# Patient Record
Sex: Male | Born: 1960
Health system: Southern US, Community
[De-identification: ages and names within clinical notes are randomized; demographics above are authoritative.]

## PROBLEM LIST (undated history)

## (undated) DIAGNOSIS — R079 Chest pain, unspecified: Secondary | ICD-10-CM

## (undated) DIAGNOSIS — K5900 Constipation, unspecified: Secondary | ICD-10-CM

## (undated) DIAGNOSIS — H9190 Unspecified hearing loss, unspecified ear: Secondary | ICD-10-CM

## (undated) DIAGNOSIS — H9319 Tinnitus, unspecified ear: Secondary | ICD-10-CM

## (undated) DIAGNOSIS — K219 Gastro-esophageal reflux disease without esophagitis: Secondary | ICD-10-CM

## (undated) DIAGNOSIS — N476 Balanoposthitis: Secondary | ICD-10-CM

## (undated) DIAGNOSIS — Z8619 Personal history of other infectious and parasitic diseases: Secondary | ICD-10-CM

## (undated) DIAGNOSIS — G4730 Sleep apnea, unspecified: Secondary | ICD-10-CM

## (undated) DIAGNOSIS — K645 Perianal venous thrombosis: Secondary | ICD-10-CM

## (undated) DIAGNOSIS — M1990 Unspecified osteoarthritis, unspecified site: Secondary | ICD-10-CM

## (undated) DIAGNOSIS — I10 Essential (primary) hypertension: Secondary | ICD-10-CM

## (undated) DIAGNOSIS — J309 Allergic rhinitis, unspecified: Secondary | ICD-10-CM

## (undated) DIAGNOSIS — G43909 Migraine, unspecified, not intractable, without status migrainosus: Secondary | ICD-10-CM

## (undated) DIAGNOSIS — H04129 Dry eye syndrome of unspecified lacrimal gland: Secondary | ICD-10-CM

## (undated) HISTORY — DX: Tinnitus, unspecified ear: H93.19

## (undated) HISTORY — PX: POLYPECTOMY: SHX149

## (undated) HISTORY — PX: FRACTURE SURGERY: SHX138

## (undated) HISTORY — DX: Perianal venous thrombosis: K64.5

## (undated) HISTORY — DX: Personal history of other infectious and parasitic diseases: Z86.19

## (undated) HISTORY — DX: Unspecified osteoarthritis, unspecified site: M19.90

## (undated) HISTORY — PX: WISDOM TOOTH EXTRACTION: SHX21

## (undated) HISTORY — DX: Constipation, unspecified: K59.00

## (undated) HISTORY — DX: Migraine, unspecified, not intractable, without status migrainosus: G43.909

## (undated) HISTORY — DX: Balanoposthitis: N47.6

## (undated) HISTORY — DX: Dry eye syndrome of unspecified lacrimal gland: H04.129

## (undated) HISTORY — DX: Gastro-esophageal reflux disease without esophagitis: K21.9

## (undated) HISTORY — DX: Unspecified hearing loss, unspecified ear: H91.90

## (undated) HISTORY — DX: Sleep apnea, unspecified: G47.30

## (undated) HISTORY — DX: Allergic rhinitis, unspecified: J30.9

## (undated) HISTORY — DX: Chest pain, unspecified: R07.9

## (undated) HISTORY — DX: Essential (primary) hypertension: I10

---

## 1991-05-16 HISTORY — PX: FOOT FRACTURE SURGERY: SHX645

## 1991-05-16 HISTORY — PX: ORIF FEMUR FRACTURE: SHX2119

## 2000-11-29 ENCOUNTER — Emergency Department (HOSPITAL_COMMUNITY): Admission: EM | Admit: 2000-11-29 | Discharge: 2000-11-29 | Payer: Self-pay | Admitting: Emergency Medicine

## 2004-09-15 ENCOUNTER — Ambulatory Visit: Payer: Self-pay | Admitting: Internal Medicine

## 2004-12-19 ENCOUNTER — Ambulatory Visit: Payer: Self-pay | Admitting: Internal Medicine

## 2005-04-28 ENCOUNTER — Ambulatory Visit: Payer: Self-pay | Admitting: Internal Medicine

## 2006-05-02 ENCOUNTER — Ambulatory Visit: Payer: Self-pay | Admitting: Internal Medicine

## 2006-05-09 ENCOUNTER — Ambulatory Visit (HOSPITAL_COMMUNITY): Admission: RE | Admit: 2006-05-09 | Discharge: 2006-05-09 | Payer: Self-pay | Admitting: Internal Medicine

## 2006-05-09 ENCOUNTER — Ambulatory Visit: Payer: Self-pay | Admitting: Cardiology

## 2006-12-18 ENCOUNTER — Ambulatory Visit: Payer: Self-pay | Admitting: Internal Medicine

## 2007-09-30 ENCOUNTER — Encounter: Payer: Self-pay | Admitting: *Deleted

## 2007-09-30 DIAGNOSIS — G43909 Migraine, unspecified, not intractable, without status migrainosus: Secondary | ICD-10-CM

## 2007-09-30 DIAGNOSIS — M6580 Other synovitis and tenosynovitis, unspecified site: Secondary | ICD-10-CM

## 2007-09-30 DIAGNOSIS — J309 Allergic rhinitis, unspecified: Secondary | ICD-10-CM

## 2007-09-30 DIAGNOSIS — R079 Chest pain, unspecified: Secondary | ICD-10-CM

## 2007-09-30 DIAGNOSIS — H04123 Dry eye syndrome of bilateral lacrimal glands: Secondary | ICD-10-CM

## 2007-09-30 DIAGNOSIS — Z8619 Personal history of other infectious and parasitic diseases: Secondary | ICD-10-CM

## 2008-01-31 ENCOUNTER — Ambulatory Visit: Payer: Self-pay | Admitting: Internal Medicine

## 2008-01-31 DIAGNOSIS — H9209 Otalgia, unspecified ear: Secondary | ICD-10-CM | POA: Insufficient documentation

## 2008-01-31 DIAGNOSIS — H9319 Tinnitus, unspecified ear: Secondary | ICD-10-CM

## 2008-01-31 DIAGNOSIS — R21 Rash and other nonspecific skin eruption: Secondary | ICD-10-CM | POA: Insufficient documentation

## 2008-01-31 DIAGNOSIS — H9313 Tinnitus, bilateral: Secondary | ICD-10-CM | POA: Insufficient documentation

## 2008-01-31 HISTORY — DX: Tinnitus, unspecified ear: H93.19

## 2008-01-31 LAB — CONVERTED CEMR LAB: Blood Glucose, Fingerstick: 104

## 2008-02-13 ENCOUNTER — Telehealth: Payer: Self-pay | Admitting: Internal Medicine

## 2008-02-14 ENCOUNTER — Ambulatory Visit: Payer: Self-pay | Admitting: Internal Medicine

## 2008-02-14 DIAGNOSIS — H6980 Other specified disorders of Eustachian tube, unspecified ear: Secondary | ICD-10-CM

## 2008-11-17 ENCOUNTER — Telehealth: Payer: Self-pay | Admitting: Internal Medicine

## 2009-04-13 ENCOUNTER — Telehealth: Payer: Self-pay | Admitting: Internal Medicine

## 2009-04-28 ENCOUNTER — Telehealth (INDEPENDENT_AMBULATORY_CARE_PROVIDER_SITE_OTHER): Payer: Self-pay | Admitting: *Deleted

## 2009-05-15 HISTORY — PX: KNEE ARTHROSCOPY: SUR90

## 2009-09-21 ENCOUNTER — Ambulatory Visit: Payer: Self-pay | Admitting: Internal Medicine

## 2009-09-21 LAB — CONVERTED CEMR LAB
ALT: 47 units/L (ref 0–53)
Albumin: 4.3 g/dL (ref 3.5–5.2)
Basophils Absolute: 0 10*3/uL (ref 0.0–0.1)
Basophils Relative: 0.5 % (ref 0.0–3.0)
Bilirubin Urine: NEGATIVE
Bilirubin, Direct: 0.2 mg/dL (ref 0.0–0.3)
CO2: 30 meq/L (ref 19–32)
Calcium: 9.5 mg/dL (ref 8.4–10.5)
Direct LDL: 130.2 mg/dL
Eosinophils Absolute: 0.1 10*3/uL (ref 0.0–0.7)
GFR calc non Af Amer: 83.12 mL/min (ref >60)
Hemoglobin: 16.9 g/dL (ref 13.0–17.0)
Ketones, ur: 15 mg/dL
MCHC: 34.8 g/dL (ref 30.0–36.0)
MCV: 92.9 fL (ref 78.0–100.0)
Nitrite: NEGATIVE
Platelets: 142 10*3/uL — ABNORMAL LOW (ref 150.0–400.0)
RBC: 5.22 M/uL (ref 4.22–5.81)
Sodium: 144 meq/L (ref 135–145)
Total Bilirubin: 1.2 mg/dL (ref 0.3–1.2)
Total CHOL/HDL Ratio: 5
Total Protein, Urine: NEGATIVE mg/dL
VLDL: 42 mg/dL — ABNORMAL HIGH (ref 0.0–40.0)
WBC: 6.3 10*3/uL (ref 4.5–10.5)
pH: 5.5 (ref 5.0–8.0)

## 2009-09-24 ENCOUNTER — Ambulatory Visit: Payer: Self-pay | Admitting: Internal Medicine

## 2009-09-24 DIAGNOSIS — K5900 Constipation, unspecified: Secondary | ICD-10-CM

## 2009-09-24 DIAGNOSIS — M2240 Chondromalacia patellae, unspecified knee: Secondary | ICD-10-CM

## 2009-09-24 DIAGNOSIS — M545 Low back pain: Secondary | ICD-10-CM

## 2009-09-24 HISTORY — DX: Constipation, unspecified: K59.00

## 2010-05-05 ENCOUNTER — Telehealth: Payer: Self-pay | Admitting: Internal Medicine

## 2010-05-06 ENCOUNTER — Ambulatory Visit: Payer: Self-pay | Admitting: Internal Medicine

## 2010-05-06 DIAGNOSIS — K645 Perianal venous thrombosis: Secondary | ICD-10-CM

## 2010-05-06 HISTORY — DX: Perianal venous thrombosis: K64.5

## 2010-06-16 NOTE — Assessment & Plan Note (Signed)
Summary: HEMORRHOIDS/MEN'S PT /NWS   Vital Signs:  Patient profile:   50 year old male Height:      62 inches Weight:      180 pounds BMI:     33.04 O2 Sat:      95 % on Room air Temp:     98.5 degrees F oral Pulse rate:   73 / minute Pulse rhythm:   regular Resp:     16 per minute BP sitting:   138 / 82  (left arm) Cuff size:   large  Vitals Entered By: Rock Nephew CMA (May 06, 2010 10:08 AM)  Nutrition Counseling: Patient's BMI is greater than 25 and therefore counseled on weight management options.  O2 Flow:  Room air  Primary Care Provider:  Norins   History of Present Illness: He returns c/o painful hemorrhoid and constipation.  Preventive Screening-Counseling & Management  Alcohol-Tobacco     Alcohol drinks/day: 0     Alcohol Counseling: not indicated; patient does not drink     Smoking Status: never     Tobacco Counseling: not indicated; no tobacco use  Hep-HIV-STD-Contraception     Hepatitis Risk: no risk noted     HIV Risk: no risk noted     STD Risk: no risk noted  Clinical Review Panels:  Prevention   Last PSA:  0.25 (09/21/2009)  Lipid Management   Cholesterol:  202 (09/21/2009)   HDL (good cholesterol):  40.30 (09/21/2009)  Diabetes Management   Creatinine:  1.0 (09/21/2009)  CBC   WBC:  6.3 (09/21/2009)   RBC:  5.22 (09/21/2009)   Hgb:  16.9 (09/21/2009)   Hct:  48.5 (09/21/2009)   Platelets:  142.0 (09/21/2009)   MCV  92.9 (09/21/2009)   MCHC  34.8 (09/21/2009)   RDW  12.8 (09/21/2009)   PMN:  57.8 (09/21/2009)   Lymphs:  29.3 (09/21/2009)   Monos:  10.5 (09/21/2009)   Eosinophils:  1.9 (09/21/2009)   Basophil:  0.5 (09/21/2009)  Complete Metabolic Panel   Glucose:  90 (09/21/2009)   Sodium:  144 (09/21/2009)   Potassium:  4.8 (09/21/2009)   Chloride:  106 (09/21/2009)   CO2:  30 (09/21/2009)   BUN:  16 (09/21/2009)   Creatinine:  1.0 (09/21/2009)   Albumin:  4.3 (09/21/2009)   Total Protein:  6.9 (09/21/2009)  Calcium:  9.5 (09/21/2009)   Total Bili:  1.2 (09/21/2009)   Alk Phos:  53 (09/21/2009)   SGPT (ALT):  47 (09/21/2009)   SGOT (AST):  31 (09/21/2009)   Medications Prior to Update: 1)  Flonase 50 Mcg/act  Susp (Fluticasone Propionate) .... Use Daily As Directed. 2)  Sumatriptan Succinate 50 Mg Tabs (Sumatriptan Succinate) .Marland Kitchen.. 1 By Mouth As Needed Migraine 3)  Metoprolol Succinate 25 Mg Xr24h-Tab (Metoprolol Succinate) .... Take 2 By Mouth Once Daily 4)  Ecotrin 325 Mg Tbec (Aspirin) .Marland Kitchen.. 1 Tab Daily 5)  Desonide 0.05 % Crea (Desonide) .... Daily 6)  Proctosol Hc 2.5 % Crea (Hydrocortisone) .... Apply Two Times A Day To Qid For Hemorrhoids  Current Medications (verified): 1)  Flonase 50 Mcg/act  Susp (Fluticasone Propionate) .... Use Daily As Directed. 2)  Sumatriptan Succinate 50 Mg Tabs (Sumatriptan Succinate) .Marland Kitchen.. 1 By Mouth As Needed Migraine 3)  Metoprolol Succinate 25 Mg Xr24h-Tab (Metoprolol Succinate) .... Take 2 By Mouth Once Daily 4)  Ecotrin 325 Mg Tbec (Aspirin) .Marland Kitchen.. 1 Tab Daily 5)  Desonide 0.05 % Crea (Desonide) .... Daily 6)  Proctosol Hc 2.5 % Crea (Hydrocortisone) .Marland KitchenMarland KitchenMarland Kitchen  Apply Two Times A Day To Qid For Hemorrhoids 7)  Ibuprofen 800 Mg Tabs (Ibuprofen) .... As Needed 8)  Hydrocodone ...Marland Kitchen Marland Kitchenprn 9)  Miralax  Powd (Polyethylene Glycol 3350) .... One Scoop in 8 Ounces of Juice Once Daily  Allergies (verified): 1)  ! 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 M DM  Social History: Reviewed history from 09/24/2009 and no changes required. 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   GI:  Complains of constipation; denies abdominal pain, bloody stools, change in bowel habits, dark tarry stools, diarrhea, excessive appetite, indigestion, loss of appetite, nausea, vomiting, vomiting blood, and yellowish skin color.  Physical Exam  General:  alert, well-developed, well-nourished, well-hydrated, appropriate dress, normal appearance, and healthy-appearing.   Head:  normocephalic, atraumatic, no abnormalities observed, and no abnormalities palpated.   Eyes:  vision grossly intact and pupils equal.   Mouth:  Oral mucosa and oropharynx without lesions or exudates.  Teeth in good repair. Neck:  supple, full ROM, no masses, no thyromegaly, no JVD, no HJR, and normal carotid upstroke.   Lungs:  normal respiratory effort, no intercostal retractions, no accessory muscle use, normal breath sounds, no dullness, no fremitus, no crackles, and no wheezes.   Heart:  normal rate, regular rhythm, no murmur, no gallop, no rub, and no JVD.   Abdomen:  soft, non-tender, normal bowel sounds, no distention, no masses, no guarding, no rigidity, no rebound tenderness, no abdominal hernia, and no inguinal hernia.   Rectal:  on the right lateral side there is a moderate-sized thrombosed hemorrhoid with no bleeding or fluctuance. the DRE is normal with no mass, fissure,  blood, lesions, swelling, or tenderness. Genitalia:  circumcised, no hydrocele, no varicocele, no scrotal masses, no testicular masses or atrophy, no cutaneous lesions, and no urethral discharge.   Msk:  normal ROM, no joint tenderness, no joint swelling, no joint warmth, and no joint instability.   Neurologic:  alert & oriented X3, cranial nerves II-XII intact, strength normal in all extremities, gait normal, and DTRs symmetrical and normal.   Skin:  turgor normal, color normal, and no suspicious lesions.   Psych:  Oriented X3, memory intact for recent and remote, normally interactive, and good eye contact.   Additional Exam:  the perianal area was cleaned with betadine and prepped and draped in sterile fashion, local anesthesia was obtained with 2% plain lidocaine, after there was anesthesia an 11-blade was used to make a small incision and a clot was easily removed from the hemorrhoid, there was minimal blood loss but he tolerated it well with  no complications. a gauze dressing was applied.   Impression & Recommendations:  Problem # 1:  HEMORRHOID, EXTERNAL, THROMBOSED (ICD-455.4) Assessment New he agrees to do Sitz baths two times a day for 3 days and to notify me of any new or worsening symptoms Orders: Excise Thrombotic Hemorrhoid ([address])  Problem # 2:  CONSTIPATION (ICD-564.00) Assessment: Unchanged  His updated medication list for this problem includes:    Miralax Powd (Polyethylene glycol 3350) ..... One scoop in 8 ounces of juice once daily  Complete Medication List: 1)  Flonase 50 Mcg/act Susp (Fluticasone propionate) .... Use daily as directed. 2)  Sumatriptan Succinate 50 Mg Tabs (Sumatriptan succinate) .Marland Kitchen.. 1 by mouth as needed migraine 3)  Metoprolol Succinate 25 Mg Xr24h-tab (Metoprolol succinate) .... Take 2 by mouth once daily 4)  Ecotrin 325 Mg Tbec (Aspirin) .Marland Kitchen.. 1 tab daily 5)  Desonide 0.05 % Crea (Desonide) .... Daily 6)  Proctosol Hc 2.5 % Crea (Hydrocortisone)  .... Apply two times a day to qid for hemorrhoids 7)  Ibuprofen 800 Mg Tabs (Ibuprofen) .... As needed 8)  Hydrocodone  ...Marland Kitchen Marland Kitchenprn 9)  Miralax Powd (Polyethylene glycol 3350) .... One scoop in 8 ounces of juice once daily  Patient Instructions: 1)  Please schedule a follow-up appointment in 2 weeks. Prescriptions: MIRALAX  POWD (POLYETHYLENE GLYCOL 3350) One scoop in 8 ounces of juice once daily  #30 day x 11   Entered and Authorized by:   Etta Grandchild MD   Signed by:   Etta Grandchild MD on 05/06/2010   Method used:   [facility]  [place] [address] [facility]       [address] [place] [address]       [place], [place]  [address]       Ph: [phone] or [phone]       Fax: [phone]   RxID:   [id number]    Orders Added: 1)  Excise Thrombotic Hemorrhoid [46320] 2)  Est. Patient Level III [66440]

## 2010-06-16 NOTE — Assessment & Plan Note (Signed)
Summary: PHYSICAL-PER PT REGULAR UHC-NOT MEDICARE--STC   Vital Signs:  Patient profile:   50 year old male Height:      62 inches Weight:      179 pounds BMI:     32.86 O2 Sat:      97 % on Room air Temp:     98.0 degrees F oral Pulse rate:   65 / minute BP sitting:   130 / 82  (left arm) Cuff size:   large  Vitals Entered By: Bill Salinas CMA (Sep 24, 2009 10:02 AM)  O2 Flow:  Room air CC: pt here for cpx/ ab  Vision Screening:      Vision Comments: Pt had a normal eye exam this year (2011) with Dr Emily Filbert   Primary Care Provider:  Norins  CC:  pt here for cpx/ ab.  History of Present Illness: Interval history: Had arthroscopy by Royetta Car for chondromalacia and possibly meniscal repair.  He has had some low back pain. He was seen at [facility] - treated with ibuprofen which has not helped. Diagnosed with low back strain.   He does note that reducing alcohol use has helped the back pain. He usually will consume 4 oz per day, more on weekend.   Bouts of irregular bowel habit.  Current Medications (verified): 1)  Flonase 50 Mcg/act  Susp (Fluticasone Propionate) .... Use Daily As Directed. 2)  Imitrex 25 Mg  Tabs (Sumatriptan Succinate) .... Take 1 Once Daily and May Repeat 1 in 1 Hr If Needed 3)  Metoprolol Succinate 25 Mg Xr24h-Tab (Metoprolol Succinate) .... Take 2 By Mouth Once Daily 4)  Ecotrin 325 Mg Tbec (Aspirin) .Marland Kitchen.. 1 Tab Daily 5)  Desonide 0.05 % Crea (Desonide) .... Daily  Allergies (verified): 1)  ! 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  The patient denies anorexia, fever, weight loss, decreased hearing, chest pain, syncope, dyspnea on exertion, prolonged cough, abdominal pain, severe indigestion/heartburn, muscle weakness, difficulty walking, depression, and enlarged lymph nodes.    Physical Exam  General:  Weel nourished well developed white male in no distress Head:  normocephalic, atraumatic, and no abnormalities observed.   Eyes:  vision grossly intact, pupils equal, pupils round, corneas and lenses clear, no injection, and no retinal abnormalitiies.   Ears:  R ear normal and L ear normal.   Nose:  no external deformity and no external erythema.   Mouth:  Oral mucosa and oropharynx without lesions or exudates.  Teeth in good repair. Neck:  supple, full ROM, no thyromegaly, and no carotid bruits.   Chest Wall:  No deformities, masses, tenderness or gynecomastia noted. Lungs:  Normal respiratory effort, chest expands symmetrically. Lungs are clear to auscultation, no crackles or wheezes. Heart:  Normal rate and regular rhythm. S1 and S2 normal without gallop, murmur, click, rub or  other extra sounds. Abdomen:  soft, non-tender, normal bowel sounds, no guarding, no abdominal hernia, and no hepatomegaly.   Rectal:  No external abnormalities noted. Normal sphincter tone. No rectal masses or tenderness. Prostate:  Prostate gland firm and smooth, no enlargement, nodularity, tenderness, mass, asymmetry or induration. Msk:  normal ROM, no joint tenderness, no joint swelling, no joint warmth, and no joint instability.   Pulses:  2+ radial and DP pulses Extremities:  No clubbing, cyanosis, edema, or deformity noted with normal full range of motion of  all joints.   Neurologic:  alert & oriented X3, cranial nerves II-XII intact, strength normal in all extremities, gait normal, and DTRs symmetrical and normal.   Skin:  turgor normal, color normal, and no suspicious lesions.   Cervical Nodes:  no anterior cervical adenopathy and no posterior cervical adenopathy.   Inguinal Nodes:  no R inguinal adenopathy and no L inguinal adenopathy.   Psych:  Oriented X3, memory intact for recent and remote, normally interactive, and good eye contact.     Impression & Recommendations:  Problem # 1:  CHONDROMALACIA PATELLA (ICD-717.7) Improved with arthroscopy.  Plan - per orthopedist  His updated medication list for this problem includes:    Ecotrin 325 Mg Tbec (Aspirin) .Marland Kitchen... 1 tab daily  Problem # 2:  LOW BACK PAIN SYNDROME (ICD-724.2) Normal exam without any evidence of disk disease or nerve compression syndrom.  Plan - regular back exercise and stretches ( handout provided)  His updated medication list for this problem includes:    Ecotrin 325 Mg Tbec (Aspirin) .Marland Kitchen... 1 tab daily  Problem # 3:  CONSTIPATION (ICD-564.00) Patient with an irregular bowel habit. Normal exam of the abdomen  Plan - use of bulk laxative of choice, e.g. metamucil, etc. Principle of bulk laxatives explained.  Problem # 4:  weight management Patient is concerned about his weight. We discussed weight management at length: target weight is 175.  Plan - smart food choices-eliminate sweeted beverages and take care with snacks, reduce alcohol consumption; PORTION SIZE CONTROL; regular exercise with target heart rate 180% of resting x 3o minutes at least 3 times a week. Goal - loss of 1 lb per month.  Problem # 5:  MIGRAINE HEADACHE (ICD-346.90) Patient reports that he is doing OK. He does better when he gets adequate sleep. We discussed how to ensure a good sleep environment - especially important since he works second shift.  Plan continue beta-blocker prophylaxis          continue as needed imitrex. The following medications were removed from the medication list:    Toprol Xl 50 Mg Tb24 (Metoprolol succinate) .Marland Kitchen... Take once daily His updated medication list for this problem includes:    Sumatriptan Succinate 50 Mg Tabs (Sumatriptan succinate) .Marland Kitchen... 1 by mouth as needed migraine    Metoprolol Succinate 25 Mg Xr24h-tab (Metoprolol succinate) .Marland Kitchen... Take 2 by mouth once daily    Ecotrin 325 Mg Tbec (Aspirin) .Marland Kitchen... 1 tab daily  Problem # 6:  Preventive Health Care (ICD-V70.0) History is medically stable. Exam is unremarkable. Lab results are good with normal blood sugar, LDL (bad) cholesterol right at goal of 130 or less - recommended prudent diet (see weight management above), HDL (good) cholesterol OK. He will be a candidate for colonoscopy. He wishes to schedule this for when he is off work - he will let us know his work schedule and will make appropriate referral.   In summary- a nice man who is medically  stable at this time.   Complete Medication List: 1)  Flonase 50 Mcg/act Susp (Fluticasone propionate) .... Use daily as directed. 2)  Sumatriptan Succinate 50 Mg Tabs (Sumatriptan succinate) .Marland Kitchen.. 1 by mouth as needed migraine 3)  Metoprolol Succinate 25 Mg Xr24h-tab (Metoprolol succinate) .... Take 2 by mouth once daily 4)  Ecotrin 325 Mg Tbec (Aspirin) .Marland Kitchen.. 1 tab daily 5)  Desonide 0.05 % Crea (Desonide) .... Daily   [facility] Note: All result statuses are Final unless otherwise noted.  Tests: (1) Lipid Panel (LIPID)   Cholesterol          [H]  202 mg/dL                   2-130     ATP III Classification            Desirable:  < 200 mg/dL                    Borderline High:  200 - 239 mg/dL               High:  > = 240 mg/dL   Triglycerides        [H]  210.0 mg/dL                 8.6-578.4     Normal:  <150 mg/dL     Borderline High:  696 - 199 mg/dL   HDL                       29.52 mg/dL                 >84.13   VLDL Cholesterol     [H]  42.0  mg/dL                  2.4-40.1  CHO/HDL Ratio:  CHD Risk                             5                    Men          Women     1/2 Average Risk     3.4          3.3     Average Risk          5.0          4.4     2X Average Risk          9.6          7.1     3X Average Risk          15.0          11.0                           Tests: (2) BMP (METABOL)   Sodium                    144 mEq/L                   135-145   Potassium                 4.8 mEq/L  3.5-5.1   Chloride                  106 mEq/L                   96-112   Carbon Dioxide            30 mEq/L                    19-32   Glucose                   90 mg/dL                    95-28   BUN                       16 mg/dL                    4-13   Creatinine                1.0 mg/dL                   2.4-4.0   Calcium                   9.5 mg/dL                   1.0-27.2   GFR                       83.12 mL/min                >60  Tests: (3) CBC Platelet w/Diff (CBCD)   White Cell Count          6.3 K/uL                    4.5-10.5   Red Cell Count            5.22 Mil/uL                 4.22-5.81   Hemoglobin                16.9 g/dL                   53.6-64.4   Hematocrit                48.5 %                      39.0-52.0   MCV                       92.9 fl                     78.0-100.0   MCHC                      34.8 g/dL                   03.4-74.2   RDW                       12.8 %                      11.5-14.6   Platelet Count       [  L]  142.0 K/uL                  150.0-400.0   Neutrophil %              57.8 %                      43.0-77.0   Lymphocyte %              29.3 %                      12.0-46.0   Monocyte %                10.5 %                      3.0-12.0   Eosinophils%              1.9 %                       0.0-5.0   Basophils %               0.5 %                       0.0-3.0   Neutrophill Absolute      3.7 K/uL                    1.4-7.7   Lymphocyte Absolute       1.8  K/uL                    0.7-4.0   Monocyte Absolute         0.7 K/uL                    0.1-1.0  Eosinophils, Absolute                             0.1 K/uL                    0.0-0.7   Basophils Absolute        0.0 K/uL                    0.0-0.1  Tests: (4) Hepatic/Liver Function Panel (HEPATIC)   Total Bilirubin           1.2 mg/dL                   1.6-1.0   Direct Bilirubin          0.2 mg/dL                   9.6-0.4   Alkaline Phosphatase      53 U/L                      39-117   AST                       31 U/L                      0-37   ALT                       47 U/L  0-53   Total Protein             6.9 g/dL                    3.8-2.5   Albumin                   4.3 g/dL                    0.5-3.9  Tests: (5) TSH (TSH)   FastTSH                   2.68 uIU/mL                 0.35-5.50  Tests: (6) UDip Only (UDIP)   Color                     YELLOW       RANGE:  Yellow;Lt. Yellow   Clarity                   CLEAR                       Clear   Specific Gravity          >=1.030                     1.000 - 1.030   Urine Ph                  5.5                         5.0-8.0   Protein                   NEGATIVE                    Negative   Urine Glucose             NEGATIVE                    Negative   Ketones                   15                          Negative   Urine Bilirubin           NEGATIVE                    Negative   Blood                     TRACE-INTACT                Negative   Urobilinogen              0.2                         0.0 - 1.0   Leukocyte Esterace        NEGATIVE                    Negative   Nitrite                   NEGATIVE  Negative  Tests: (7) Prostate Specific Antigen (PSA)   PSA-Hyb                   0.25 ng/mL                  0.10-4.00  Tests: (8) Cholesterol LDL - Direct (DIRLDL)  Cholesterol LDL - Direct                             130.2 mg/dL     Optimal:  <161 mg/dL     Near or Above  Optimal:  100-129 mg/dL     Borderline High:  096-045 mg/dL     High:  409-811 mg/dL     Very High:  >914 mg/dLPrescriptions: METOPROLOL SUCCINATE 25 MG XR24H-TAB (METOPROLOL SUCCINATE) take 2 by mouth once daily  #60 Tablet x 12   Entered and Authorized by:   Jacques Navy MD   Signed by:   Jacques Navy MD on 09/24/2009   Method used:   [facility]  [place] [address] [facility]       [address]       [place], [place]  [address]       Ph: [phone] or [phone]       Fax: [phone]   RxID:   [id number] SUMATRIPTAN SUCCINATE 50 MG TABS (SUMATRIPTAN SUCCINATE) 1 by mouth as needed migraine  #6 x 12   Entered and Authorized by:   Jacques Navy MD   Signed by:   Jacques Navy MD on 09/24/2009   Method used:   [facility]  [place] [address] [facility]       [address]       [place], [place]  [address]       Ph: [phone] or [phone]       Fax: [phone]   RxID:   [id number]

## 2010-06-16 NOTE — Progress Notes (Signed)
Summary: med request  Phone Note Call from Patient Call back at [phone]   Caller: Patient Reason for Call: Refill Medication Summary of Call: Pt is requesting rx for hemorrhoid cream-Proctosol HC 2.5 [facility]-med not listed on active or past med list-please advise Initial call taken by: Brenton Grills CMA Duncan Dull),  May 05, 2010 11:06 AM  Follow-up for Phone Call        ok for proctotosol [facility] - apply cream two times a day - qid for hemorrhoids. 60g tube. refill as needed. added to med list Follow-up by: Jacques Navy MD,  May 05, 2010 5:05 PM  Additional Follow-up for Phone Call Additional follow up Details #1::        left mess to call office back...............Marland KitchenLamar Sprinkles, CMA  May 05, 2010 5:29 PM     Additional Follow-up for Phone Call Additional follow up Details #2::    lm with pts daughter for pt to call me back Follow-up by: Ami Bullins CMA,  May 06, 2010 9:56 AM  Additional Follow-up for Phone Call Additional follow up Details #3:: Details for Additional Follow-up Action Taken: pt here for appt with Dr Yetta Barre Additional Follow-up by: Ami Bullins CMA,  May 06, 2010 10:08 AM  New/Updated Medications: PROCTOSOL HC 2.5 % CREA (HYDROCORTISONE) apply two times a day to qid for hemorrhoids Prescriptions: PROCTOSOL HC 2.5 % CREA (HYDROCORTISONE) apply two times a day to qid for hemorrhoids  #60 x 0   Entered by:   Lamar Sprinkles, CMA   Authorized by:   Jacques Navy MD   Signed by:   Lamar Sprinkles, CMA on 05/05/2010   Method used:   [facility]        [facility]  [place] [address] [facility]       [address]       [place], [place]  [address]       Ph: [phone] or [phone]       Fax: [phone]   RxID:   [id number]

## 2010-08-11 ENCOUNTER — Telehealth: Payer: Self-pay | Admitting: *Deleted

## 2010-08-11 MED ORDER — TEMAZEPAM 22.5 MG PO CAPS: 22.5000 mg | ORAL_CAPSULE | Freq: Every evening | ORAL | Status: DC | PRN

## 2010-08-11 MED ORDER — TEMAZEPAM 15 MG PO CAPS: 15.0000 mg | ORAL_CAPSULE | Freq: Every evening | ORAL | Status: DC | PRN

## 2010-08-11 NOTE — Telephone Encounter (Signed)
Patient requesting rx rf for temazepam 15mg  1 at bedtime. 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

## 2010-08-11 NOTE — Telephone Encounter (Signed)
K. Temazepam 15mg , #30, sig 1 po qhs prn. Refill x 5

## 2010-09-30 NOTE — Letter (Signed)
August 12, 2006    [facility]  Fax# [phone]   RE:  Jordan Cantrell, Jordan Cantrell  MRN:  [id number]  /  DOB:  June 21, 1960   Re:  Jordan Cantrell  Account#: [id number]  [facility] Medical Record #:  [id number]  Date of Birth:  August 08, 1960  Date of Service:  May 09, 1006.  Charge:  [id number].   To Whom It May Concern:   Mr. Jordan Cantrell is a patient I follow for primary care.  He has been  followed in the office on a regular basis, since May 04, 1989.   Mr. Jordan Cantrell presented to the office May 02, 2006 complaining of  chest pain for one week.  He reported this as 2/10.  He was having chest  pain at rest.  He reported to me mild decreased exercise tolerance.   CARDIAC RISK PROFILE:  Positive for male gender, hypertension, nicotine  use, overweight and a positive family history with coronary artery  disease in his mother and cerebrovascular disease in his father.   The patient's examination at his office visit revealed his vital signs  to be stable.  His heart rate was regular.  I appreciated no murmurs.  He had good peripheral pulses.  Abdominal exam was unremarkable with no  tenderness on examination or any other GI findings.  Patient's most  recent laboratory from May 02, 2006 that dates, revealed a blood  sugar of 114, giving him a borderline serum glucose.  His triglycerides  were elevated at 163.  LDL cholesterol was 107.   Because of the patient's positive family history, the nature of his  discomfort, and his decreased exercise tolerance, I believe he was a  candidate for risk stratification and recommended he undergo a stress  only Cardiolite study to this end.  Of note, the patient did have an  remarkable EKG for incomplete right bundle branch block on the day of  his office visit, further raising my concern for possible ischemic  disease.   Our office at the time this test was ordered was suffering a manpower  shortage in regards to patient care coordinators, and because of my  concern for the patient, I scheduled his test directly.  By doing this,  the study was not pre-certified prior to the study being done.   It is my professional opinion that this was a study that was indicated,  given the patient's cardiac risk profile, the appearance of an abnormal  EKG with a bundle branch block and his potentially cardiac symptoms.  I  am requesting on the patient's behalf that this be a covered service.  If there was an error in getting pre-certification that was totally my  responsibility, in that I scheduled the test directly.   Positive consideration of this matter will be greatly appreciated.    Sincerely,      Jordan Gess. Norins, MD  Electronically Signed    MEN/MedQ  DD: 08/12/2006  DT: 08/12/2006  Job #: [id number]   CC:   Franki Cabot, Office Manager Leodis [place]

## 2010-10-02 ENCOUNTER — Other Ambulatory Visit: Payer: Self-pay | Admitting: Internal Medicine

## 2010-11-07 ENCOUNTER — Ambulatory Visit (INDEPENDENT_AMBULATORY_CARE_PROVIDER_SITE_OTHER): Payer: 59 | Admitting: Internal Medicine

## 2010-11-07 ENCOUNTER — Telehealth: Payer: Self-pay | Admitting: *Deleted

## 2010-11-07 ENCOUNTER — Encounter: Payer: Self-pay | Admitting: *Deleted

## 2010-11-07 ENCOUNTER — Encounter: Payer: Self-pay | Admitting: Internal Medicine

## 2010-11-07 VITALS — BP 142/86 | HR 74 | Temp 97.7°F | Resp 14 | Wt 178.0 lb

## 2010-11-07 DIAGNOSIS — W57XXXA Bitten or stung by nonvenomous insect and other nonvenomous arthropods, initial encounter: Secondary | ICD-10-CM

## 2010-11-07 DIAGNOSIS — IMO0001 Reserved for inherently not codable concepts without codable children: Secondary | ICD-10-CM

## 2010-11-07 MED ORDER — DOXYCYCLINE HYCLATE 100 MG PO TABS: 100.0000 mg | ORAL_TABLET | Freq: Two times a day (BID) | ORAL | Status: AC

## 2010-11-07 NOTE — Telephone Encounter (Signed)
Same Day Abstraction for Medicines.

## 2010-11-07 NOTE — Telephone Encounter (Signed)
Same Day Abstraction. 

## 2010-11-08 NOTE — Progress Notes (Signed)
  Subjective:    Patient ID: Jordan Cantrell, male    DOB: Mar 25, 1961, 50 y.o.   MRN: [id number]  HPI [name] presents for evaluation after tick bite. He had found a small tick with a white spot on the upper inner proximal left UE. He denies having a target rash. He has had central visual blurring in the left eye. His ophthalmologist is at a loss as to the cause of visual change. Patient has had no fever, chills, headache, rash, N/V/D.   PMH, FamHx and SocHx reviewed for any changes and relevance.    Review of Systems Review of Systems  Constitutional:  Negative for fever, chills, activity change and unexpected weight change.  HEENT:  Negative for hearing loss, ear pain, congestion, neck stiffness and postnasal drip. Negative for sore throat or swallowing problems. Negative for dental complaints.   Eyes: Positive for vision loss or change in visual acuity OS.  Respiratory: Negative for chest tightness and wheezing.   Cardiovascular: Negative for chest pain and palpitation. No decreased exercise tolerance Gastrointestinal: No change in bowel habit. No bloating or gas. No reflux or indigestion Genitourinary: Negative for urgency, frequency, flank pain and difficulty urinating.  Musculoskeletal: Negative for myalgias, back pain, arthralgias and gait problem.  Neurological: Negative for dizziness, tremors, weakness and headaches.  Hematological: Negative for adenopathy.  Psychiatric/Behavioral: Negative for behavioral problems and dysphoric mood.       Objective:   Physical Exam Vitals noted Gen' - WNWD white male in no distress HEENT - C&X clear. Puyl - normal respirations Derm - small punctate insect bite left UE inner aspect without surrounding erythema.       Assessment & Plan:  Tick bite - reviewed state health dept web site and CDC on tick bite. The tick he describes may be a lone star tick and there are reports of Ehrlichiosis infection from this type of tick. He isn't  sure if the tick had fed. His symptoms are atypical and there is no report of visual change as he describes.  Plan - provided him info from [facility] website           Will cover with doxycycline 100mg  bid x 7 - recommended treatment.

## 2011-02-24 ENCOUNTER — Ambulatory Visit: Payer: 59 | Admitting: Internal Medicine

## 2011-03-22 ENCOUNTER — Telehealth: Payer: Self-pay | Admitting: *Deleted

## 2011-03-22 NOTE — Telephone Encounter (Signed)
Refill request for Temazepam 15 mg SIG take one capsule by mouth at bedtime prn  QTY 30 please Advise refills

## 2011-03-23 MED ORDER — TEMAZEPAM 15 MG PO CAPS: 15.0000 mg | ORAL_CAPSULE | Freq: Every evening | ORAL | Status: DC | PRN

## 2011-03-23 NOTE — Telephone Encounter (Signed)
k x 5 

## 2011-05-16 HISTORY — PX: COLONOSCOPY: SHX174

## 2011-06-30 ENCOUNTER — Ambulatory Visit (INDEPENDENT_AMBULATORY_CARE_PROVIDER_SITE_OTHER): Payer: 59 | Admitting: Internal Medicine

## 2011-06-30 ENCOUNTER — Ambulatory Visit (INDEPENDENT_AMBULATORY_CARE_PROVIDER_SITE_OTHER)
Admission: RE | Admit: 2011-06-30 | Discharge: 2011-06-30 | Disposition: A | Discharge status: Home / Self Care | Payer: 59 | Source: Ambulatory Visit | Attending: Internal Medicine | Admitting: Internal Medicine

## 2011-06-30 ENCOUNTER — Encounter: Payer: Self-pay | Admitting: Internal Medicine

## 2011-06-30 DIAGNOSIS — R110 Nausea: Secondary | ICD-10-CM

## 2011-06-30 DIAGNOSIS — R062 Wheezing: Secondary | ICD-10-CM

## 2011-06-30 DIAGNOSIS — J209 Acute bronchitis, unspecified: Secondary | ICD-10-CM

## 2011-06-30 DIAGNOSIS — R05 Cough: Secondary | ICD-10-CM

## 2011-06-30 MED ORDER — METHYLPREDNISOLONE ACETATE PF 80 MG/ML IJ SUSP
120.0000 mg | Freq: Once | INTRAMUSCULAR | Status: DC
  Administered 2011-06-30: 120 mg via INTRAMUSCULAR

## 2011-06-30 MED ORDER — METHYLPREDNISOLONE ACETATE PF 80 MG/ML IJ SUSP
120.0000 mg | Freq: Once | INTRAMUSCULAR | Status: AC
  Administered 2011-06-30: 120 mg via INTRAMUSCULAR

## 2011-06-30 MED ORDER — FLUTICASONE-SALMETEROL 100-50 MCG/DOSE IN AEPB: 1.0000 | INHALATION_SPRAY | Freq: Two times a day (BID) | RESPIRATORY_TRACT | Status: DC

## 2011-06-30 MED ORDER — PROMETHAZINE HCL 25 MG PO TABS: 25.0000 mg | ORAL_TABLET | Freq: Four times a day (QID) | ORAL | Status: DC | PRN

## 2011-06-30 MED ORDER — HYDROCOD POLST-CHLORPHEN POLST 10-8 MG/5ML PO LQCR: 5.0000 mL | Freq: Two times a day (BID) | ORAL | Status: DC | PRN

## 2011-06-30 MED ORDER — AZITHROMYCIN 250 MG PO TABS: ORAL_TABLET | ORAL | Status: DC

## 2011-06-30 NOTE — Progress Notes (Signed)
  Subjective:    Patient ID: Jordan Cantrell, male    DOB: December 22, 1960, 51 y.o.   MRN: [id number]  HPI   HPI  C/o URI sx's x   7 days. C/o ST, cough, weakness. Not better with OTC medicines. Actually, the patient is getting worse. The patient did not sleep last night due to cough.  Review of Systems  Constitutional: Positive for fever, chills and fatigue.  HENT: Positive for congestion, rhinorrhea, sneezing and postnasal drip.   Eyes: Positive for photophobia and pain. Negative for discharge and visual disturbance.  Respiratory: Positive for cough and wheezing.   Positive for chest pain.  Gastrointestinal: Negative for vomiting, abdominal pain, diarrhea and abdominal distention.  Genitourinary: Negative for dysuria and difficulty urinating.  Skin: Negative for rash.  Neurological: Positive for dizziness, weakness and light-headedness.      Review of Systems     Objective:   Physical Exam  Constitutional: He is oriented to person, place, and time. He appears well-developed.  HENT:  Mouth/Throat: Oropharynx is clear and moist.  Eyes: Conjunctivae are normal. Pupils are equal, round, and reactive to light.       eryth mucosa  Neck: Normal range of motion. No JVD present. No thyromegaly present.  Cardiovascular: Normal rate, regular rhythm, normal heart sounds and intact distal pulses.  Exam reveals no gallop and no friction rub.   No murmur heard. Pulmonary/Chest: Effort normal. No respiratory distress. He has wheezes. He has no rales. He exhibits no tenderness.  Abdominal: Soft. Bowel sounds are normal. He exhibits no distension and no mass. There is no tenderness. There is no rebound and no guarding.  Musculoskeletal: Normal range of motion. He exhibits no edema and no tenderness.  Lymphadenopathy:    He has no cervical adenopathy.  Neurological: He is alert and oriented to person, place, and time. He has normal reflexes. No cranial nerve deficit. He exhibits normal muscle  tone. Coordination normal.  Skin: Skin is warm and dry. No rash noted.  Psychiatric: He has a normal mood and affect. His behavior is normal. Judgment and thought content normal.     I personally provided the Advair inhaler use teaching. After the teaching patient was able to demonstrate it's use effectively. All questions were answered     Assessment & Plan:

## 2011-06-30 NOTE — Patient Instructions (Addendum)
Use over-the-counter  "cold" medicines  such as"Afrin" nasal spray for nasal congestion as directed instead. Use" Delsym" or" Robitussin" cough syrup varietis for cough.  You can use plain "Tylenol" or "Advil' for fever, chills and achyness.   

## 2011-07-01 MED ORDER — AZITHROMYCIN 250 MG PO TABS: ORAL_TABLET | ORAL | Status: AC

## 2011-07-01 MED ORDER — PROMETHAZINE HCL 25 MG PO TABS: 25.0000 mg | ORAL_TABLET | Freq: Four times a day (QID) | ORAL | Status: AC | PRN

## 2011-07-02 ENCOUNTER — Encounter: Payer: Self-pay | Admitting: Internal Medicine

## 2011-07-02 DIAGNOSIS — R110 Nausea: Secondary | ICD-10-CM | POA: Insufficient documentation

## 2011-07-02 NOTE — Assessment & Plan Note (Signed)
promethazine prn

## 2011-07-02 NOTE — Assessment & Plan Note (Signed)
Zpac 

## 2011-07-02 NOTE — Assessment & Plan Note (Signed)
Due to bronchitis 2/13 Depo-medrol 120 mg im Advair bid

## 2011-09-07 ENCOUNTER — Ambulatory Visit (INDEPENDENT_AMBULATORY_CARE_PROVIDER_SITE_OTHER): Payer: 59 | Admitting: Internal Medicine

## 2011-09-07 ENCOUNTER — Encounter: Payer: Self-pay | Admitting: Internal Medicine

## 2011-09-07 VITALS — BP 130/90 | HR 83 | Temp 98.5°F | Resp 16 | Wt 179.0 lb

## 2011-09-07 DIAGNOSIS — R350 Frequency of micturition: Secondary | ICD-10-CM

## 2011-09-07 MED ORDER — TAMSULOSIN HCL 0.4 MG PO CAPS: 0.4000 mg | ORAL_CAPSULE | Freq: Every day | ORAL | Status: DC

## 2011-09-07 NOTE — Patient Instructions (Signed)
No evidence of infection as a cause of urinary frequency with small volumes. Suspect enlargement of the prostate gland with some bladder outlet obstruction. This may be a mild viral infection of the prostate.  Plan - take tamsulosin 0.4 mg every PM           If no relief call           For fever, chills, pain with urination call           Continue tamsulosin for 2-3 weeks and stop to see if the symptoms have resolved.

## 2011-09-11 DIAGNOSIS — R350 Frequency of micturition: Secondary | ICD-10-CM | POA: Insufficient documentation

## 2011-09-11 NOTE — Assessment & Plan Note (Signed)
Patient with urinary frequency w/o other symptoms thus suggestive of mild BPH related BOO  Plan - trial of Flomax

## 2011-09-11 NOTE — Progress Notes (Signed)
Subjective:    Patient ID: Jordan Cantrell, male    DOB: 07/01/1960, 51 y.o.   MRN: [id number]  HPI Jordan Cantrell presents acutely for increased urinary  Frequency. He denies fever, chills, dysuria, hematuria or significant nocturia. He denies flank, lower abdominal, perineal pain or discomfort. He does drink a fair amount of fluids.  Past Medical History  Diagnosis Date  . Chest pain, unspecified   . Other synovitis and tenosynovitis     Right Elbow  . Balanoposthitis     Mild  . Dry eye syndrome   . History of shingles   . Allergic rhinitis, cause unspecified   . Migraine, unspecified, without mention of intractable migraine without mention of status migrainosus    Past Surgical History  Procedure Date  . Orif femur fracture     Right  . Knee arthroscopy     Right for Condromalacia   Family History  Problem Relation Age of Onset  . Diabetes type II Mother    History   Social History  . Marital Status: Married    Spouse Name: N/A    Number of Children: N/A  . Years of Education: N/A   Occupational History  . Not on file.   Social History Main Topics  . Smoking status: Current Everyday Smoker    Types: Cigarettes  . Smokeless tobacco: Current User    Types: Chew  . Alcohol Use: Not on file  . Drug Use: Not on file  . Sexually Active: Not on file   Other Topics Concern  . 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 aspirin 325 MG EC tablet Take 325 mg by mouth daily.       . metoprolol succinate (TOPROL-XL) 25 MG 24 hr tablet TAKE 2 TABLETS BY MOUTH EVERY DAY  60 tablet  11  . chlorpheniramine-HYDROcodone (TUSSIONEX) 10-8 MG/5ML LQCR Take 5 mLs by mouth every 12 (twelve) hours as needed (cough).  140 mL  0  . desonide (DESOWEN) 0.05 % cream Apply topically daily.        . Fluticasone-Salmeterol (ADVAIR DISKUS) 100-50 MCG/DOSE  AEPB Inhale 1 puff into the lungs 2 (two) times daily.  1 each  1  . hydrocortisone (ANUSOL-HC) 2.5 % rectal cream Place rectally 2 (two) times daily. Apply 2 to 4 times daily for Hemorrhoids       . ibuprofen (ADVIL,MOTRIN) 800 MG tablet Take 800 mg by mouth as needed.        . polyethylene glycol powder (GLYCOLAX/MIRALAX) powder Take 17 g by mouth daily as needed. Mix in 8oz of juice      . SUMAtriptan (IMITREX) 50 MG tablet Take 50 mg by mouth as needed. For Migraines.       . temazepam (RESTORIL) 15 MG capsule Take 1 capsule (15 mg total) by mouth at bedtime as needed.  30 capsule  5      Review of Systems System review is negative for any constitutional, cardiac, pulmonary, GI or neuro symptoms or complaints other than as described in the HPI.     Objective:   Physical Exam Filed Vitals:   09/07/11 1726  BP: 130/90  Pulse: 83  Temp: 98.5 F (36.9 C)  Resp: 16   Wt Readings from Last 3 Encounters:  09/07/11 179 lb (81.194 kg)  06/30/11 179 lb (81.194  kg)  11/07/10 178 lb (80.74 kg)   Gen'l - WNWD white man in no distress Back- no flank tnederness to percussion Abdomen- no abdominal tenderness to palpation       Assessment & Plan:

## 2011-10-21 ENCOUNTER — Other Ambulatory Visit: Payer: Self-pay | Admitting: Internal Medicine

## 2011-11-02 ENCOUNTER — Encounter: Payer: Self-pay | Admitting: Internal Medicine

## 2011-11-02 ENCOUNTER — Ambulatory Visit (INDEPENDENT_AMBULATORY_CARE_PROVIDER_SITE_OTHER)
Admission: RE | Admit: 2011-11-02 | Discharge: 2011-11-02 | Disposition: A | Discharge status: Home / Self Care | Payer: 59 | Source: Ambulatory Visit | Attending: Internal Medicine | Admitting: Internal Medicine

## 2011-11-02 ENCOUNTER — Other Ambulatory Visit (INDEPENDENT_AMBULATORY_CARE_PROVIDER_SITE_OTHER): Payer: 59

## 2011-11-02 ENCOUNTER — Ambulatory Visit (INDEPENDENT_AMBULATORY_CARE_PROVIDER_SITE_OTHER): Payer: 59 | Admitting: Internal Medicine

## 2011-11-02 VITALS — BP 128/84 | HR 96 | Temp 98.4°F | Resp 16 | Ht 67.0 in | Wt 178.0 lb

## 2011-11-02 DIAGNOSIS — F17200 Nicotine dependence, unspecified, uncomplicated: Secondary | ICD-10-CM

## 2011-11-02 DIAGNOSIS — Z0000 Encounter for general adult medical examination without abnormal findings: Secondary | ICD-10-CM

## 2011-11-02 DIAGNOSIS — Z720 Tobacco use: Secondary | ICD-10-CM

## 2011-11-02 DIAGNOSIS — R350 Frequency of micturition: Secondary | ICD-10-CM

## 2011-11-02 DIAGNOSIS — J309 Allergic rhinitis, unspecified: Secondary | ICD-10-CM

## 2011-11-02 LAB — CBC WITH DIFFERENTIAL/PLATELET
Basophils Absolute: 0 10*3/uL (ref 0.0–0.1)
Basophils Relative: 0.3 % (ref 0.0–3.0)
Eosinophils Absolute: 0.2 10*3/uL (ref 0.0–0.7)
Eosinophils Relative: 2.8 % (ref 0.0–5.0)
Lymphs Abs: 1.2 10*3/uL (ref 0.7–4.0)
MCHC: 33.4 g/dL (ref 30.0–36.0)
Monocytes Absolute: 0.9 10*3/uL (ref 0.1–1.0)
Neutro Abs: 5.2 10*3/uL (ref 1.4–7.7)

## 2011-11-02 LAB — COMPREHENSIVE METABOLIC PANEL
ALT: 40 U/L (ref 0–53)
Albumin: 4.2 g/dL (ref 3.5–5.2)
Alkaline Phosphatase: 55 U/L (ref 39–117)
BUN: 10 mg/dL (ref 6–23)
CO2: 27 mEq/L (ref 19–32)
Calcium: 9.3 mg/dL (ref 8.4–10.5)
Chloride: 105 mEq/L (ref 96–112)
Potassium: 4.1 mEq/L (ref 3.5–5.1)
Total Bilirubin: 0.5 mg/dL (ref 0.3–1.2)

## 2011-11-02 LAB — HEPATIC FUNCTION PANEL
ALT: 40 U/L (ref 0–53)
AST: 32 U/L (ref 0–37)
Albumin: 4.2 g/dL (ref 3.5–5.2)

## 2011-11-02 LAB — PSA: PSA: 0.3 ng/mL (ref 0.10–4.00)

## 2011-11-02 LAB — LDL CHOLESTEROL, DIRECT: Direct LDL: 84.7 mg/dL

## 2011-11-02 LAB — LIPID PANEL: Cholesterol: 146 mg/dL (ref 0–200)

## 2011-11-02 MED ORDER — FLUTICASONE PROPIONATE 50 MCG/ACT NA SUSP: 2.0000 | Freq: Every day | NASAL | Status: DC

## 2011-11-02 NOTE — Patient Instructions (Addendum)
History and exam are consistent with either allergic or vaso-motor rhinitis. If you have congestion + fever+tenderness + bitter, metallic tasting thick snot (+/- blood) it may be an infection.   Plan -  trail of Nasonex, an inhalation nasal steroid to down-regulate the immune response that is the core of the problem  Nasal saline several times a day.   Allergic Rhinitis Allergic rhinitis is when the mucous membranes in the nose respond to allergens. Allergens are particles in the air that cause your body to have an allergic reaction. This causes you to release allergic antibodies. Through a chain of events, these eventually cause you to release histamine into the blood stream (hence the use of antihistamines). Although meant to be protective to the body, it is this release that causes your discomfort, such as frequent sneezing, congestion and an itchy runny nose.   CAUSES   The pollen allergens may come from grasses, trees, and weeds. This is seasonal allergic rhinitis, or "hay fever." Other allergens cause year-round allergic rhinitis (perennial allergic rhinitis) such as house dust mite allergen, pet dander and mold spores.   SYMPTOMS    Nasal stuffiness (congestion).   Runny, itchy nose with sneezing and tearing of the eyes.   There is often an itching of the mouth, eyes and ears.  It cannot be cured, but it can be controlled with medications. DIAGNOSIS   If you are unable to determine the offending allergen, skin or blood testing may find it. TREATMENT    Avoid the allergen.   Medications and allergy shots (immunotherapy) can help.   Hay fever may often be treated with antihistamines in pill or nasal spray forms. Antihistamines block the effects of histamine. There are over-the-counter medicines that may help with nasal congestion and swelling around the eyes. Check with your caregiver before taking or giving this medicine.  If the treatment above does not work, there are many new  medications your caregiver can prescribe. Stronger medications may be used if initial measures are ineffective. Desensitizing injections can be used if medications and avoidance fails. Desensitization is when a patient is given ongoing shots until the body becomes less sensitive to the allergen. Make sure you follow up with your caregiver if problems continue. SEEK MEDICAL CARE IF:    You develop fever (more than 100.5 F (38.1 C).   You develop a cough that does not stop easily (persistent).   You have shortness of breath.   You start wheezing.   Symptoms interfere with normal daily activities.  Document Released: 01/24/2001 Document Revised: 04/20/2011 Document Reviewed: 08/05/2008 [facility] Patient Information 2012 [place], [place].

## 2011-11-02 NOTE — Progress Notes (Signed)
Subjective:    Patient ID: Jordan Cantrell, male    DOB: [birth year], 51 y.o.   MRN: [id number]  HPI Jordan Cantrell presents for a general wellness exam and management of other chronic and acute problems.  He has a problem with chronic sinus congestion w/o infection suggestive of chronic rhinitis.  He was seen recently for urinary urgency and was tried on Flomax which has helped some. He also tries to drink 64 oz per day.   The risk factors are reflected in the social history.  The roster of all physicians providing medical care to patient - is listed in the Snapshot section of the chart.  Activities of daily living:  The patient is 100% inedpendent in all ADLs: dressing, toileting, feeding as well as independent mobility  Home safety : The patient has smoke detectors in the home. Falls - needs to look at better fall protection. They wear seatbelts. firearms are present in the home, kept in a safe fashion. There is no violence in the home.   There is no risks for hepatitis, STDs or HIV. There is no   history of blood transfusion. They have no travel history to infectious disease endemic areas of the world.  The patient has seen their dentist in the last six month. They have seen their eye doctor in the last year. They admit to hearing difficulty and have had audiologic testing in the last year.  They do not  have excessive sun exposure. Discussed the need for sun protection: hats, long sleeves and use of sunscreen if there is significant sun exposure.   Diet: the importance of a healthy diet is discussed. They do have a healthy diet.  The patient has no regular exercise program.  The benefits of regular aerobic exercise were discussed.  Depression screen: there are no signs or vegative symptoms of depression- irritability, change in appetite, anhedonia, sadness/tearfullness.  Cognitive assessment: the patient manages all their financial and personal affairs and is actively engaged.  The  following portions of the patient's history were reviewed and updated as appropriate: allergies, current medications, past family history, past medical history,  past surgical history, past social history  and problem list.  Vision, hearing, body mass index were assessed and reviewed.   During the course of the visit the patient was educated and counseled about appropriate screening and preventive services including : fall prevention , diabetes screening, nutrition counseling, colorectal cancer screening, and recommended immunizations.  Past Medical History  Diagnosis Date  . Chest pain, unspecified   . Other synovitis and tenosynovitis     Right Elbow  . Balanoposthitis     Mild  . Dry eye syndrome   . History of shingles   . Allergic rhinitis, cause unspecified   . Migraine, unspecified, without mention of intractable migraine without mention of status migrainosus    Past Surgical History  Procedure Date  . Orif femur fracture     Right  . Knee arthroscopy     Right for Condromalacia   Family History  Problem Relation Age of Onset  . Diabetes type II Mother   . Diabetes Mother   . Heart disease Mother   . Hyperlipidemia Mother   . Hypertension Mother   . Diabetes Father   . Heart disease Father   . Hypertension Father   . Schizophrenia Brother    History   Social History  . Marital Status: Married    Spouse Name: N/A    Number of Children:  2  . Years of Education: 16   Occupational History  . Associate Professor Tobacco   Social History Main Topics  . Smoking status: Current Everyday Smoker    Types: Cigarettes  . Smokeless tobacco: Current User    Types: Chew  . Alcohol Use: 5.0 - 6.0 oz/week    10-12 drink(s) per week     beer  . Drug Use: No  . Sexually Active: Yes -- Male partner(s)   Other Topics Concern  . Not on file   Social History Narrative   HSG. 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 Married-'79. 1 dtr - '87, 1 son - '83, 1 granddaughter. Marriage  in good health. 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  HEENT:  Negative for ear pain, neck stiffness. Negative for sore throat or swallowing problems. Negative for dental complaints.  Positive for congestion/sinus pressure and post-nasal drainage Eyes: Negative for vision loss or change in visual acuity.  Respiratory: Negative for chest tightness and wheezing. Negative for DOE.   Cardiovascular: Negative for chest pain or palpitations. No decreased exercise tolerance Gastrointestinal: No change in bowel habit. No bloating or gas. No reflux or indigestion Genitourinary: Positive for urgency, frequency, negative for pain and difficulty urinating.  Musculoskeletal: Negative for myalgias, back pain, arthralgias and gait problem.  Neurological: Negative for dizziness, tremors, weakness and headaches.  Hematological: Negative for adenopathy.  Psychiatric/Behavioral: Negative for behavioral problems and dysphoric mood.       Objective:   Physical Exam Filed Vitals:   11/02/11 1345  BP: 128/84  Pulse: 96  Temp: 98.4 F (36.9 C)  Resp: 16   Wt Readings from Last 3 Encounters:  11/02/11 178 lb (80.74 kg)  09/07/11 179 lb (81.194 kg)  06/30/11 179 lb (81.194 kg)    Gen'l: Well nourished well developed white male in no acute distress  HEENT: Head: Normocephalic and atraumatic. Right Ear: External ear normal. EAC/TM nl. Left Ear: External ear normal.  EAC/TM nl. Nose: Nose normal. Mouth/Throat: Oropharynx is clear and moist. Dentition - native, in good repair. No buccal or palatal lesions. Posterior pharynx clear. Eyes: Conjunctivae and sclera clear. EOM intact. Pupils are equal, round, and reactive to light. Right eye exhibits no discharge. Left eye exhibits no discharge. Neck: Normal range of motion. Neck supple. No JVD present. No tracheal deviation present. No thyromegaly present.    Cardiovascular: Normal rate, regular rhythm, no gallop, no friction rub, no murmur heard.      Quiet precordium. 2+ radial and DP pulses . No carotid bruits Pulmonary/Chest: Effort normal. No respiratory distress or increased WOB, no wheezes, no rales. No chest wall deformity or CVAT. Abdominal: Soft. Bowel sounds are normal in all quadrants. He exhibits no distension, no tenderness, no rebound or guarding, No heptosplenomegaly  Genitourinary: deferred  Musculoskeletal: Normal range of motion. He exhibits no edema and no tenderness.       Small and large joints without redness, synovial thickening or deformity. Full range of motion preserved about all small, median and large joints.  Lymphadenopathy:    He has no cervical or supraclavicular adenopathy.  Neurological: He is alert and oriented to person, place, and time. CN II-XII intact. DTRs 2+ and symmetrical biceps, radial and patellar tendons. Cerebellar function normal with no tremor, rigidity, normal gait and station.  Skin: Skin is warm and dry. No rash noted. No erythema.  Psychiatric: He  has a normal mood and affect. His behavior is normal. Thought content normal.   Lab Results  Component Value Date   WBC 7.4 11/02/2011   HGB 16.3 11/02/2011   HCT 48.8 11/02/2011   PLT 152.0 11/02/2011   GLUCOSE 102* 11/02/2011   CHOL 146 11/02/2011   TRIG 240.0* 11/02/2011   HDL 33.00* 11/02/2011   LDLDIRECT 84.7 11/02/2011        ALT 40 11/02/2011   AST 32 11/02/2011        NA 141 11/02/2011   K 4.1 11/02/2011   CL 105 11/02/2011   CREATININE 1.1 11/02/2011   BUN 10 11/02/2011   CO2 27 11/02/2011   TSH 2.68 09/21/2009   PSA 0.30 11/02/2011          Assessment & Plan:

## 2011-11-04 ENCOUNTER — Encounter: Payer: Self-pay | Admitting: Internal Medicine

## 2011-11-04 DIAGNOSIS — Z0000 Encounter for general adult medical examination without abnormal findings: Secondary | ICD-10-CM | POA: Insufficient documentation

## 2011-11-04 NOTE — Assessment & Plan Note (Signed)
Interval medical history significant for allergic vs vasomotor rhinitis, urinary frequency and relapse nicotine addiction. He has had no major illness, surgery or injury. Physical exam is normal. Lab results reveal mild triglyceride elevation - amenable to diet management but otherwise normal. He is due for colonoscopy. Discussed pros and cons of prostate cancer screening (USPHCTF recommendations reviewed and ACU April '13 recommendations) and he requests evaluation at this time - PSA normal. Next screening in 2-3 years. Immunizations - due to Tdap.   In summary - a very nice man who is medically stable but will initiate treatment for rhinitis, urinary frequency and he is adamantly advised to re-quit nicotine use. He will return as needed or in 1 year.

## 2011-11-04 NOTE — Assessment & Plan Note (Signed)
Discussed the perils of smoking and the addictive nature of nicotine.  Plan -  Smoking cessation - counseled to keep a smoker's diary and to develop a cessation strategy around behavioral changes in order to break the cycle of addiction.  To return for follow-up and medical intervention/assistance if needed  (greater than 15 minutes in counseling)

## 2011-11-04 NOTE — Assessment & Plan Note (Signed)
Symptoms are more c/w BPH and partial bladder outlet obstruction than OAB. He did see improvement with flomax  Plan - resume/continue Flomax daily.

## 2011-11-04 NOTE — Assessment & Plan Note (Signed)
Persistent sinus congestion and drainage c/w rhinitis.  Plan Resume nasal inhalational steroid spray - sample of nasonex, Rx for fluticasone if positive response.

## 2011-11-27 ENCOUNTER — Telehealth: Payer: Self-pay

## 2011-11-27 DIAGNOSIS — Z1211 Encounter for screening for malignant neoplasm of colon: Secondary | ICD-10-CM

## 2011-11-27 NOTE — Telephone Encounter (Signed)
P[facility] notified.  

## 2011-11-27 NOTE — Telephone Encounter (Signed)
Pt called requesting referral for a colonoscopy.

## 2011-11-28 ENCOUNTER — Encounter: Payer: Self-pay | Admitting: Gastroenterology

## 2012-01-04 ENCOUNTER — Ambulatory Visit (AMBULATORY_SURGERY_CENTER): Payer: 59 | Admitting: *Deleted

## 2012-01-04 VITALS — Ht 66.0 in | Wt 175.4 lb

## 2012-01-04 DIAGNOSIS — Z1211 Encounter for screening for malignant neoplasm of colon: Secondary | ICD-10-CM

## 2012-01-04 MED ORDER — MOVIPREP 100 G PO SOLR: ORAL | Status: DC

## 2012-01-05 ENCOUNTER — Encounter: Payer: Self-pay | Admitting: Gastroenterology

## 2012-01-16 ENCOUNTER — Other Ambulatory Visit: Payer: Self-pay | Admitting: Internal Medicine

## 2012-01-17 ENCOUNTER — Other Ambulatory Visit: Payer: Self-pay | Admitting: *Deleted

## 2012-01-17 MED ORDER — TEMAZEPAM 15 MG PO CAPS: 15.0000 mg | ORAL_CAPSULE | Freq: Every evening | ORAL | Status: DC | PRN

## 2012-01-17 NOTE — Telephone Encounter (Signed)
Rx REFILL REQUEST FOR TEMAZEPAM 15MG . LAST OV 10/2011

## 2012-01-17 NOTE — Telephone Encounter (Signed)
k x 5 

## 2012-01-17 NOTE — Telephone Encounter (Signed)
Refill called to [facility]

## 2012-01-19 ENCOUNTER — Encounter: Payer: Self-pay | Admitting: Gastroenterology

## 2012-01-19 ENCOUNTER — Ambulatory Visit (AMBULATORY_SURGERY_CENTER): Payer: 59 | Admitting: Gastroenterology

## 2012-01-19 VITALS — BP 97/68 | HR 80 | Temp 97.5°F | Resp 20 | Ht 66.0 in | Wt 175.0 lb

## 2012-01-19 DIAGNOSIS — D126 Benign neoplasm of colon, unspecified: Secondary | ICD-10-CM

## 2012-01-19 DIAGNOSIS — Z1211 Encounter for screening for malignant neoplasm of colon: Secondary | ICD-10-CM

## 2012-01-19 MED ORDER — SODIUM CHLORIDE 0.9 % IV SOLN: 500.0000 mL | INTRAVENOUS | Status: DC

## 2012-01-19 NOTE — Op Note (Signed)
[facility] [address] [place], [address]   COLONOSCOPY PROCEDURE REPORT  PATIENT: Jordan Cantrell, Jordan Cantrell  MR#: [id number] BIRTHDATE: February 05, 1961 , 51  yrs. old GENDER: Male ENDOSCOPIST: Meryl Dare, MD, [facility] REFERRED XB:[id number] Esther Hardy, M.D. PROCEDURE DATE:  01/19/2012 PROCEDURE:   Colonoscopy with snare polypectomy ASA CLASS:   Class II INDICATIONS: average risk screening MEDICATIONS: MAC sedation, administered by CRNA and propofol (Diprivan) 200mg  IV DESCRIPTION OF PROCEDURE:   After the risks benefits and alternatives of the procedure were thoroughly explained, informed consent was obtained.  A digital rectal exam revealed no abnormalities of the rectum.   The LB [id number]  endoscope was introduced through the anus and advanced to the cecum, which was identified by both the appendix and ileocecal valve. No adverse events experienced.   The quality of the prep was excellent, using MoviPrep  The instrument was then slowly withdrawn as the colon was fully examined.   COLON FINDINGS: A sessile polyp measuring 6 mm in size was found in the ascending colon.  A polypectomy was performed with a cold snare.  The resection was complete and the polyp tissue was completely retrieved.   A sessile polyp measuring 9 mm in size was found in the descending colon.  A polypectomy was performed using snare cautery.  The resection was complete and the polyp tissue was completely retrieved.   The colon was otherwise normal.  There was no diverticulosis, inflamation, polyps or cancers unless previously stated.  Retroflexed views revealed small internal hemorrhoids. The time to cecum=1 minutes 43 seconds.  Withdrawal time=8 minutes 44 seconds.  The scope was withdrawn and the procedure completed. COMPLICATIONS: There were no complications.  ENDOSCOPIC IMPRESSION: 1.   Sessile polyp in the ascending colon; polypectomy was performed with a cold snare 2.   Sessile  polyp in the descending colon; polypectomy was performed using snare cautery 3.   Small internal hemorrhoids  RECOMMENDATIONS: 1.  Hold aspirin, aspirin products, and anti-inflammatory medication for 2 weeks. 2.  await pathology results 3.  repeat Colonscopy in 3-5 years pending pathology review.  eSigned:  Meryl Dare, MD, [facility] 01/19/2012 8:53 AM

## 2012-01-19 NOTE — Progress Notes (Signed)
Patient did not experience any of the following events: a burn prior to discharge; a fall within the facility; wrong site/side/patient/procedure/implant event; or a hospital transfer or hospital admission upon discharge from the facility. (G8907) Patient did not have preoperative order for IV antibiotic SSI prophylaxis. (G8918)  

## 2012-01-19 NOTE — Progress Notes (Signed)
Propofol give and oxygen managed per Azucena Kuba CRNA

## 2012-01-19 NOTE — Patient Instructions (Addendum)
AVOID ASPIRIN, ASPIRIN PRODUCTS AND ANTIINFLAMATORIES (MOTRIN,ADVIL,IBUPROFEN,ALEVE ETC)  FOR TWO Jordan Cantrell, UNTIL September 20,2013.   YOU HAD AN ENDOSCOPIC PROCEDURE TODAY AT THE [facility]: Refer to the procedure report that was given to you for any specific questions about what was found during the examination.  If the procedure report does not answer your questions, please call your gastroenterologist to clarify.  If you requested that your care partner not be given the details of your procedure findings, then the procedure report has been included in a sealed envelope for you to review at your convenience later.  YOU SHOULD EXPECT: Some feelings of bloating in the abdomen. Passage of more gas than usual.  Walking can help get rid of the air that was put into your GI tract during the procedure and reduce the bloating. If you had a lower endoscopy (such as a colonoscopy or flexible sigmoidoscopy) you may notice spotting of blood in your stool or on the toilet paper. If you underwent a bowel prep for your procedure, then you may not have a normal bowel movement for a few days.  DIET: Your first meal following the procedure should be a light meal and then it is ok to progress to your normal diet.  A half-sandwich or bowl of soup is an example of a good first meal.  Heavy or fried foods are harder to digest and may make you feel nauseous or bloated.  Likewise meals heavy in dairy and vegetables can cause extra gas to form and this can also increase the bloating.  Drink plenty of fluids but you should avoid alcoholic beverages for 24 hours.  ACTIVITY: Your care partner should take you home directly after the procedure.  You should plan to take it easy, moving slowly for the rest of the day.  You can resume normal activity the day after the procedure however you should NOT DRIVE or use heavy machinery for 24 hours (because of the sedation medicines used during the test).    SYMPTOMS TO REPORT  IMMEDIATELY: A gastroenterologist can be reached at any hour.  During normal business hours, 8:30 AM to 5:00 PM Monday through Friday, call [phone].  After hours and on weekends, please call the GI answering service at [phone] who will take a message and have the physician on call contact you.   Following lower endoscopy (colonoscopy or flexible sigmoidoscopy):  Excessive amounts of blood in the stool  Significant tenderness or worsening of abdominal pains  Swelling of the abdomen that is new, acute  Fever of 100F or higher  FOLLOW UP: If any biopsies were taken you will be contacted by phone or by letter within the next 1-3 weeks.  Call your gastroenterologist if you have not heard about the biopsies in 3 weeks.  Our staff will call the home number listed on your records the next business day following your procedure to check on you and address any questions or concerns that you may have at that time regarding the information given to you following your procedure. This is a courtesy call and so if there is no answer at the home number and we have not heard from you through the emergency physician on call, we will assume that you have returned to your regular daily activities without incident.  SIGNATURES/CONFIDENTIALITY: You and/or your care partner have signed paperwork which will be entered into your electronic medical record.  These signatures attest to the fact that that the information above on your  After Visit Summary has been reviewed and is understood.  Full responsibility of the confidentiality of this discharge information lies with you and/or your care-partner.

## 2012-01-22 ENCOUNTER — Telehealth: Payer: Self-pay | Admitting: *Deleted

## 2012-01-22 NOTE — Telephone Encounter (Signed)
  Follow up Call-  Call back number 01/19/2012  Post procedure Call Back phone  # [phone] cell  Permission to leave phone message Yes     Left message on answering machine to call us back if he is experiencing problems or has questions.

## 2012-01-23 ENCOUNTER — Encounter: Payer: Self-pay | Admitting: Gastroenterology

## 2012-05-27 ENCOUNTER — Other Ambulatory Visit: Payer: Self-pay | Admitting: Internal Medicine

## 2012-07-29 ENCOUNTER — Other Ambulatory Visit: Payer: Self-pay | Admitting: *Deleted

## 2012-07-29 ENCOUNTER — Other Ambulatory Visit: Payer: Self-pay | Admitting: Internal Medicine

## 2012-07-29 MED ORDER — TEMAZEPAM 15 MG PO CAPS: 15.0000 mg | ORAL_CAPSULE | Freq: Every evening | ORAL | Status: DC | PRN

## 2012-07-30 ENCOUNTER — Other Ambulatory Visit: Payer: Self-pay | Admitting: Internal Medicine

## 2012-08-01 NOTE — Telephone Encounter (Signed)
[facility] 

## 2012-10-02 ENCOUNTER — Ambulatory Visit: Payer: 59 | Admitting: Internal Medicine

## 2012-11-06 ENCOUNTER — Other Ambulatory Visit: Payer: Self-pay | Admitting: Internal Medicine

## 2012-11-12 ENCOUNTER — Ambulatory Visit (INDEPENDENT_AMBULATORY_CARE_PROVIDER_SITE_OTHER): Payer: 59 | Admitting: Internal Medicine

## 2012-11-12 ENCOUNTER — Encounter: Payer: Self-pay | Admitting: Internal Medicine

## 2012-11-12 VITALS — BP 120/84 | HR 87 | Temp 98.1°F | Ht 67.0 in | Wt 171.0 lb

## 2012-11-12 DIAGNOSIS — R030 Elevated blood-pressure reading, without diagnosis of hypertension: Secondary | ICD-10-CM

## 2012-11-12 DIAGNOSIS — G4700 Insomnia, unspecified: Secondary | ICD-10-CM

## 2012-11-12 DIAGNOSIS — I10 Essential (primary) hypertension: Secondary | ICD-10-CM | POA: Insufficient documentation

## 2012-11-12 MED ORDER — METOPROLOL SUCCINATE ER 50 MG PO TB24: ORAL_TABLET | ORAL | Status: DC

## 2012-11-12 NOTE — Assessment & Plan Note (Signed)
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 It sounds like the core reason for feeling bad is inadequate sleep. A primay key is good sleep hygiene:   Sleep is a learned or unlearned behavior. 5 principles of sleep hygiene - 1) regular hour to retire and rise 7days/wk 2) no stimulants - caffeine, chocolat, alcohol, 3) regular exercise  - every afternoon  4) sleep sanctuary - a space that is right light, temperature, sound level, good bed where all you do is sleep. 5) No extinction behaviors, e.g. Laying in bed awake doing anything but sleeping. This means if you have a bad night - no naps, etc  For medication either increase the melatonin - check with a knowledgeable herbal person, or we can increase the temazepam to 30 mg at bedtime.

## 2012-11-12 NOTE — Progress Notes (Signed)
  Subjective:    Patient ID: Jordan Cantrell, male    DOB: 11-20-1960, 52 y.o.   MRN: [id number]  HPI Insomnia - is having a hard time sleeping exacerbated by a 3rd shift job. He takes temazepam 15 mg and did at one time supplement with melatonin but this has lost efficacy. He has generalized weakness ans well as his fatigue.   Blood pressure has had some excursions to high 140's to high 80's.    Review of Systems     Objective:   Physical Exam        Assessment & Plan:

## 2012-11-12 NOTE — Patient Instructions (Addendum)
Sleep - sounds like the core reason for feeling bad is inadequate sleep. A primay key is good sleep hygiene:  Sleep is a learned or unlearned behavior. 5 principles of sleep hygiene - 1) regular hour to retire and rise 7days/wk 2) no stimulants - caffeine, chocolat, alcohol, 3) regular exercise  - every afternoon  4) sleep sanctuary - a space that is right light, temperature, sound level, good bed where all you do is sleep. 5) No extinction behaviors, e.g. Laying in bed awake doing anything but sleeping. This means if you have a bad night - no naps, etc  For medication either increase the melatonin - check with a knowledgeable herbal person, or we can increase the temazepam to 30 mg at bedtime.   Blood pressure - not really in the danger zone. Before increasing medications try getting your sleep habit under better control as well as letting the stress die down. If the BP does remain up we can adjust your medication. You can communicate via MyChart and possibly save office visit time for you.

## 2012-11-12 NOTE — Assessment & Plan Note (Signed)
  Blood pressure - not really in the danger zone. Before increasing medications try getting your sleep habit under better control as well as letting the stress die down. If the BP does remain up we can adjust your medication. You can communicate via MyChart and possibly save office visit time for you.

## 2013-02-03 ENCOUNTER — Other Ambulatory Visit: Payer: Self-pay | Admitting: Internal Medicine

## 2013-02-04 NOTE — Telephone Encounter (Signed)
Temazepam called to pharmacy  

## 2013-02-06 ENCOUNTER — Other Ambulatory Visit: Payer: Self-pay

## 2013-02-06 MED ORDER — METOPROLOL SUCCINATE ER 50 MG PO TB24: ORAL_TABLET | ORAL | Status: DC

## 2013-10-22 ENCOUNTER — Telehealth: Payer: Self-pay | Admitting: Internal Medicine

## 2013-10-22 NOTE — Telephone Encounter (Signed)
Pt called ref: Shingles.  Wrong number given on call back at 14:57  ABB-CAN

## 2013-11-12 ENCOUNTER — Encounter: Payer: Self-pay | Admitting: Physician Assistant

## 2013-11-12 ENCOUNTER — Ambulatory Visit (INDEPENDENT_AMBULATORY_CARE_PROVIDER_SITE_OTHER): Payer: 59 | Admitting: Physician Assistant

## 2013-11-12 VITALS — BP 138/88 | HR 66 | Temp 98.2°F | Resp 18 | Wt 180.0 lb

## 2013-11-12 DIAGNOSIS — Z23 Encounter for immunization: Secondary | ICD-10-CM

## 2013-11-12 DIAGNOSIS — L259 Unspecified contact dermatitis, unspecified cause: Secondary | ICD-10-CM

## 2013-11-12 DIAGNOSIS — H6980 Other specified disorders of Eustachian tube, unspecified ear: Secondary | ICD-10-CM

## 2013-11-12 MED ORDER — PREDNISONE 10 MG PO TABS: ORAL_TABLET | ORAL | Status: DC

## 2013-11-12 NOTE — Patient Instructions (Signed)
Prednisone taper as directed.  Continue topical anti-itch creams, and use cool compresses to help relieve itching symptoms.  Plain Over the Counter Mucinex (NOT Mucinex D) for thick secretions  Force NON dairy fluids, drinking plenty of water is best.    Over the Counter Flonase OR Nasacort AQ 1 spray in each nostril twice a day as needed. Use the "crossover" technique into opposite nostril spraying toward opposite ear @ 45 degree angle, not straight up into nostril.   Plain Over the Counter Allegra (NOT D )  160 daily , OR Loratidine 10 mg , OR Zyrtec 10 mg @ bedtime  as needed for itchy eyes & sneezing.  Saline Irrigation and Saline Sprays can also help reduce symptoms.  If emergency symptoms discussed during visit developed, seek medical attention immediately.  Followup as needed, or for worsening or persistent symptoms despite treatment.   Allergies  Allergies may happen from anything your body is sensitive to. This may be food, medicines, pollens, chemicals, and many other things. Food allergies can be severe and deadly.  HOME CARE  If you do not know what causes a reaction, keep a diary. Write down the foods you ate and the symptoms that followed. Avoid foods that cause reactions.  If you have red raised spots (hives) or a rash:  Take medicine as told by your doctor.  Use medicines for red raised spots and itching as needed.  Apply cold cloths (compresses) to the skin. Take a cool bath. Avoid hot baths or showers.  If you are severely allergic:  It is often necessary to go to the hospital after you have treated your reaction.  Wear your medical alert jewelry.  You and your family must learn how to give a allergy shot or use an allergy kit (anaphylaxis kit).  Always carry your allergy kit or shot with you. Use this medicine as told by your doctor if a severe reaction is occurring. GET HELP RIGHT AWAY IF:  You have trouble breathing or are making high-pitched whistling  sounds (wheezing).  You have a tight feeling in your chest or throat.  You have a puffy (swollen) mouth.  You have red raised spots, puffiness (swelling), or itching all over your body.  You have had a severe reaction that was helped by your allergy kit or shot. The reaction can return once the medicine has worn off.  You think you are having a food allergy. Symptoms most often happen within 30 minutes of eating a food.  Your symptoms have not gone away within 2 days or are getting worse.  You have new symptoms.  You want to retest yourself with a food or drink you think causes an allergic reaction. Only do this under the care of a doctor. MAKE SURE YOU:   Understand these instructions.  Will watch your condition.  Will get help right away if you are not doing well or get worse. Document Released: 08/26/2012 Document Reviewed: 08/26/2012 [facility] Patient Information 2015 [place]. This information is not intended to replace advice given to you by your health care provider. Make sure you discuss any questions you have with your health care provider.

## 2013-11-12 NOTE — Progress Notes (Signed)
Subjective:    Patient ID: Jordan Cantrell, male    DOB: [birth year], 53 y.o.   MRN: [id number]  Otalgia  There is pain in the left ear. This is a new problem. The current episode started more than 1 month ago. Episode frequency: occasionally. The problem has been unchanged. There has been no fever. Pain scale: usually not painful, 7/10 yesterday in the shower. Associated symptoms include hearing loss and a rash. Pertinent negatives include no abdominal pain, coughing, diarrhea, drainage, ear discharge, headaches, neck pain, rhinorrhea, sore throat or vomiting. He has tried nothing for the symptoms. There is no history of a chronic ear infection, hearing loss or a tympanostomy tube.  Rash This is a new problem. The current episode started more than 1 month ago. The problem has been gradually improving since onset. The affected locations include the right foot. The rash is characterized by itchiness. He was exposed to plant contact. Pertinent negatives include no anorexia, congestion, cough, diarrhea, eye pain, facial edema, fatigue, fever, joint pain, nail changes, rhinorrhea, shortness of breath, sore throat or vomiting. Past treatments include topical steroids and anti-itch cream. The treatment provided mild relief. His past medical history is significant for allergies. There is no history of asthma or eczema.      Review of Systems  Constitutional: Negative for fever, chills and fatigue.  HENT: Positive for ear pain, hearing loss and sinus pressure. Negative for congestion, ear discharge, rhinorrhea and sore throat.   Eyes: Negative for pain.  Respiratory: Negative for cough and shortness of breath.   Gastrointestinal: Negative for nausea, vomiting, abdominal pain, diarrhea and anorexia.  Musculoskeletal: Negative for joint pain and neck pain.  Skin: Positive for rash. Negative for nail changes.  Neurological: Negative for headaches.  All other systems reviewed and are negative.    Past  Medical History  Diagnosis Date  . Chest pain, unspecified     2007  . Other synovitis and tenosynovitis     Right Elbow  . Balanoposthitis     Mild  . Dry eye syndrome   . History of shingles   . Allergic rhinitis, cause unspecified   . Migraine, unspecified, without mention of intractable migraine without mention of status migrainosus     History   Social History  . Marital Status: Married    Spouse Name: N/A    Number of Children: 2  . Years of Education: 16   Occupational History  . Geophysicist/field seismologist Tobacco   Social History Main Topics  . Smoking status: Current Every Day Smoker -- 0.10 packs/day    Types: Cigarettes  . Smokeless tobacco: Current User    Types: Snuff  . Alcohol Use: 5.0 - 6.0 oz/week    10-12 drink(s) per week     Comment: beer  . Drug Use: No  . Sexual Activity: Yes    Partners: Female   Other Topics Concern  . Not on file   Social History Narrative   HSG. 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 Married-'79. 1 dtr - '87, 1 son - '83, 1 granddaughter. Marriage in good health. 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 Orif femur fracture  1993    Right  . Knee arthroscopy  2011    Right for Condromalacia  . Foot fracture surgery  1993    right    Family History  Problem Relation Age of Onset  . Diabetes type II Mother   .  Diabetes Mother   . Heart disease Mother   . Hyperlipidemia Mother   . Hypertension Mother   . Diabetes Father   . Heart disease Father   . Hypertension Father   . Schizophrenia Brother   . Colon polyps Sister   . Colon cancer Neg Hx   . Stomach cancer Neg Hx   . Esophageal cancer Neg Hx   . Rectal cancer Neg Hx     Allergies  Allergen Reactions  . Codeine Nausea Only    Current Outpatient Prescriptions on File Prior to Visit  Medication Sig Dispense Refill  . aspirin 325 MG EC tablet Take 325 mg by mouth daily.       Marland Kitchen desonide (DESOWEN) 0.05 % cream Apply topically  daily.        Marland Kitchen ibuprofen (ADVIL,MOTRIN) 800 MG tablet Take 800 mg by mouth as needed.        . Melaton-Thean-Cham-PassF-LBalm (MELATONIN + L-THEANINE PO) Take by mouth as needed.      . metoprolol succinate (TOPROL-XL) 50 MG 24 hr tablet TAKE 2 TABLETS BY MOUTH EVERY DAY  60 tablet  11  . psyllium (METAMUCIL) 58.6 % powder Take 1 packet by mouth as needed.      . SUMAtriptan (IMITREX) 50 MG tablet TAKE 1 TABLET BY MOUTH AS NEEDED MIGRAINE  6 tablet  0  . Tamsulosin HCl (FLOMAX) 0.4 MG CAPS Take 1 capsule (0.4 mg total) by mouth daily.  30 capsule  3  . temazepam (RESTORIL) 15 MG capsule TAKE ONE CAPSULE BY MOUTH AT BEDTIME AS NEEDED  30 capsule  3  . fluticasone (FLONASE) 50 MCG/ACT nasal spray Place 2 sprays into the nose daily.  16 g  6   No current facility-administered medications on file prior to visit.    EXAM: BP 138/88  Pulse 66  Temp(Src) 98.2 F (36.8 C) (Oral)  Resp 18  Wt 180 lb (81.647 kg)     Objective:   Physical Exam  Nursing note and vitals reviewed. Constitutional: He is oriented to person, place, and time. He appears well-developed and well-nourished. No distress.  HENT:  Head: Normocephalic and atraumatic.  Right Ear: External ear normal.  Left Ear: External ear normal.  Nose: Nose normal.  Mouth/Throat: No oropharyngeal exudate.  Oropharynx is slightly erythematous, no exudate. Bilateral TMs slightly bulging, otherwise normal. Bilateral frontal and maxillary sinuses non-TTP.   Eyes: Conjunctivae and EOM are normal. Pupils are equal, round, and reactive to light.  Neck: Normal range of motion. Neck supple. No JVD present.  Cardiovascular: Normal rate, regular rhythm and intact distal pulses.   Pulmonary/Chest: Effort normal and breath sounds normal. No stridor. No respiratory distress. He exhibits no tenderness.  Musculoskeletal: Normal range of motion.  Lymphadenopathy:    He has no cervical adenopathy.  Neurological: He is alert and oriented to  person, place, and time.  Skin: Skin is warm and dry. Rash noted. He is not diaphoretic. No erythema. No pallor.  Medial aspect of the right foot has some excoriations, evidence of blistering and drainage. No surrounding erythema, no fluctuance, no pain to palpation, no excessive warmth.  Psychiatric: He has a normal mood and affect. His behavior is normal. Judgment and thought content normal.    Lab Results  Component Value Date   WBC 7.4 11/02/2011   HGB 16.3 11/02/2011   HCT 48.8 11/02/2011   PLT 152.0 11/02/2011   GLUCOSE 102* 11/02/2011   CHOL 146 11/02/2011   TRIG 240.0* 11/02/2011  HDL 33.00* 11/02/2011   LDLDIRECT 84.7 11/02/2011   ALT 40 11/02/2011   ALT 40 11/02/2011   AST 32 11/02/2011   AST 32 11/02/2011   NA 141 11/02/2011   K 4.1 11/02/2011   CL 105 11/02/2011   CREATININE 1.1 11/02/2011   BUN 10 11/02/2011   CO2 27 11/02/2011   TSH 2.68 09/21/2009   PSA 0.30 11/02/2011         Assessment & Plan:  Deontrey was seen today for left ear pain and poison ivy right foot.  Diagnoses and associated orders for this visit:  ETD (eustachian tube dysfunction), unspecified laterality Comments: History of allergies. Will have pt start nasal steroid and antihistamine. And continue saline rinses.  Contact dermatitis Comments: Poison ivy related. Will continue steroid cream, cool compressess. Steroid taper to help symptoms. - predniSONE (DELTASONE) 10 MG tablet; 3 tablets twice daily for 2 days, then 2 tablets twice daily for 2 days,  then 1 tablet twice daily for 2 days, then one tablet daily  for 6 days  Need for prophylactic vaccination with combined diphtheria-tetanus-pertussis (DTP) vaccine - Tdap vaccine greater than or equal to 7yo IM    Pt will try OTC steroids and antihistamines to relieve occasional ear fullness/pain symptoms.  Return precautions provided, and patient handout on allergies.  Plan to follow up as needed, or for worsening or persistent symptoms despite  treatment.  Patient Instructions  Prednisone taper as directed.  Continue topical anti-itch creams, and use cool compresses to help relieve itching symptoms.  Plain Over the Counter Mucinex (NOT Mucinex D) for thick secretions  Force NON dairy fluids, drinking plenty of water is best.    Over the Counter Flonase OR Nasacort AQ 1 spray in each nostril twice a day as needed. Use the "crossover" technique into opposite nostril spraying toward opposite ear @ 45 degree angle, not straight up into nostril.   Plain Over the Counter Allegra (NOT D )  160 daily , OR Loratidine 10 mg , OR Zyrtec 10 mg @ bedtime  as needed for itchy eyes & sneezing.  Saline Irrigation and Saline Sprays can also help reduce symptoms.  If emergency symptoms discussed during visit developed, seek medical attention immediately.  Followup as needed, or for worsening or persistent symptoms despite treatment.

## 2013-11-12 NOTE — Progress Notes (Signed)
Pre visit review using our clinic review tool, if applicable. No additional management support is needed unless otherwise documented below in the visit note. 

## 2013-11-27 ENCOUNTER — Other Ambulatory Visit: Payer: Self-pay | Admitting: *Deleted

## 2013-11-27 MED ORDER — SUMATRIPTAN SUCCINATE 50 MG PO TABS
ORAL_TABLET | ORAL | Status: DC
Start: 2013-11-27 — End: 2016-12-12

## 2014-02-16 ENCOUNTER — Telehealth: Payer: Self-pay | Admitting: Internal Medicine

## 2014-02-16 MED ORDER — METOPROLOL SUCCINATE ER 50 MG PO TB24
ORAL_TABLET | ORAL | Status: DC
Start: 2014-02-16 — End: 2014-07-20

## 2014-02-16 NOTE — Telephone Encounter (Signed)
Medication called in 

## 2014-02-16 NOTE — Telephone Encounter (Signed)
[id number] - [place], [place] - [address] is requesting re-fill on metoprolol succinate (TOPROL-XL) 50 MG 24 hr tablet Pt has an appt on 07/02/14 with Dr. Yong Channel

## 2014-07-02 ENCOUNTER — Ambulatory Visit: Payer: Self-pay | Admitting: Family Medicine

## 2014-07-20 ENCOUNTER — Other Ambulatory Visit: Payer: Self-pay | Admitting: Family Medicine

## 2014-07-22 ENCOUNTER — Encounter: Payer: Self-pay | Admitting: Family Medicine

## 2014-07-22 ENCOUNTER — Ambulatory Visit (INDEPENDENT_AMBULATORY_CARE_PROVIDER_SITE_OTHER): Payer: 59 | Admitting: Family Medicine

## 2014-07-22 VITALS — BP 148/88 | HR 74 | Temp 98.2°F | Wt 181.0 lb

## 2014-07-22 DIAGNOSIS — M1990 Unspecified osteoarthritis, unspecified site: Secondary | ICD-10-CM | POA: Insufficient documentation

## 2014-07-22 DIAGNOSIS — E785 Hyperlipidemia, unspecified: Secondary | ICD-10-CM

## 2014-07-22 DIAGNOSIS — Z7251 High risk heterosexual behavior: Secondary | ICD-10-CM

## 2014-07-22 DIAGNOSIS — G43109 Migraine with aura, not intractable, without status migrainosus: Secondary | ICD-10-CM

## 2014-07-22 DIAGNOSIS — Z860100 Personal history of colon polyps, unspecified: Secondary | ICD-10-CM | POA: Insufficient documentation

## 2014-07-22 DIAGNOSIS — Z720 Tobacco use: Secondary | ICD-10-CM

## 2014-07-22 DIAGNOSIS — I10 Essential (primary) hypertension: Secondary | ICD-10-CM

## 2014-07-22 DIAGNOSIS — Z87891 Personal history of nicotine dependence: Secondary | ICD-10-CM

## 2014-07-22 NOTE — Assessment & Plan Note (Signed)
Well controlled with beta blocker prophylaxis. Continue sumatriptan prn but rare use.

## 2014-07-22 NOTE — Progress Notes (Signed)
Jordan Reddish, MD Phone: [phone]  Subjective:  Patient presents today to establish care with me as their new primary care provider. Patient was formerly a patient of Dr. Linda Hedges. Chief complaint-noted.   Hypertension-mild poor control systolic  BP Readings from Last 3 Encounters:  07/22/14 148/88  11/12/13 138/88  11/12/12 120/84   worked 12 hours last night, only slept 3 hours. MRI Saturday with same BP for back.  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   Migraine Headache- excellent control -prophylaxis with metoprolol for this and BP. He has not uses sumatriptan in 6 months ROS- no blurry vision  Tobacco abuse- poor control, improved in some ways Recently quit smoking-praised efforts but continues to chew ROS- no shortness of breath or dyspnea on exertion  The following were reviewed and entered/updated in epic: Past Medical History  Diagnosis Date  . Chest pain, unspecified     2007  . Other synovitis and tenosynovitis     Right Elbow  . Balanoposthitis     Mild  . Dry eye syndrome   . History of shingles   . Allergic rhinitis, cause unspecified   . Migraine, unspecified, without mention of intractable migraine without mention of status migrainosus   . TINNITUS NOS 01/31/2008    Qualifier: Diagnosis of  By: Plotnikov MD, Evie Lacks   . Constipation 09/24/2009    Prn metamucil    . HEMORRHOID, EXTERNAL, THROMBOSED 05/06/2010    Qualifier: Diagnosis of  By: Ronnald Ramp MD, Arvid Right.    Patient Active Problem List   Diagnosis Date Noted  . Tobacco abuse 11/02/2011    Priority: High  . Insomnia 11/12/2012    Priority: Medium  . Hypertension 11/12/2012    Priority: Medium  . Migraine 09/30/2007    Priority: Medium  . Osteoarthritis 07/22/2014    Priority: Low  .  History of colonic polyps 07/22/2014    Priority: Low  . Urinary frequency 09/11/2011    Priority: Low  . LOW BACK PAIN SYNDROME 09/24/2009    Priority: Low  . Dry eyes 09/30/2007    Priority: Low  . Allergic rhinitis 09/30/2007    Priority: Low  . History of shingles 09/30/2007    Priority: Low   Past Surgical History  Procedure Laterality Date  . Orif femur fracture  1993    Right  . Knee arthroscopy  2011    Right for Condromalacia  . Foot fracture surgery  1993    right-crushed all 4 toes reportedly    Family History  Problem Relation Age of Onset  . Diabetes type II Mother   . Heart disease Mother     CABG-died during this, smoker  . Hyperlipidemia Mother   . Hypertension Mother   . Diabetes Father   . Heart disease Father     thought death due to this, smoker  . Hypertension Father   . Schizophrenia Father   . Colon polyps Sister   . Colon cancer Neg Hx   . Stomach cancer Neg Hx   . Esophageal cancer Neg Hx   . Rectal cancer Neg Hx      Medications- reviewed and updated Current Outpatient Prescriptions  Medication Sig Dispense Refill  . aspirin 81 MG tablet Take 81 mg by mouth daily.    . metoprolol succinate (TOPROL-XL) 50 MG 24 hr  tablet TAKE 2 TABLETS BY MOUTH EVERY DAY 60 tablet 3  . fluticasone (FLONASE) 50 MCG/ACT nasal spray Place 2 sprays into the nose daily. 16 g 6  Mobic and methocarbamol  Allergies-reviewed and updated Allergies  Allergen Reactions  . Codeine Nausea Only    History   Social History  . Marital Status: Married    Spouse Name: N/A  . Number of Children: 2  . Years of Education: 16   Occupational History  . Geophysicist/field seismologist Tobacco   Social History Main Topics  . Smoking status: Former Smoker -- 0.10 packs/day    Types: Cigarettes    Quit date: 04/28/2014  . Smokeless tobacco: Current User    Types: Snuff     Comment: multiple recurrences, watch closely  . Alcohol Use: 6.0 - 7.2 oz/week    10-12 Standard  drinks or equivalent per week     Comment: beer  . Drug Use: No  . Sexual Activity:    Partners: Female   Other Topics Concern  . None   Social History Narrative   Married-'81. 1 dtr - '87, 1 son - '83, 2 granddaughters   Marriage in good health.       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       Hobbies: time with grandkids, work with cows, minimal travel due to pain    ROS--See HPI   Objective: BP 148/88 mmHg  Pulse 74  Temp(Src) 98.2 F (36.8 C)  Wt 181 lb (82.101 kg) Gen: NAD, resting comfortably HEENT: Mucous membranes are moist. Oropharynx normal CV: RRR no murmurs rubs or gallops Lungs: CTAB no crackles, wheeze, rhonchi Abdomen: soft/nontender/nondistended/normal bowel sounds. No rebound or guarding.  Ext: no edema Skin: warm, dry Neuro: grossly normal, moves all extremities, PERRLA   Assessment/Plan:  Hypertension Mild poor systolic control on metoprolol 100mg . Discussed last 2 visits have been well controlled. As first elevation, we decided to continue metoprolol and add DASH diet, if poor control at follow up would need additional BP support.    Migraine Well controlled with beta blocker prophylaxis. Continue sumatriptan prn but rare use.    Tobacco abuse Advised cessation of dip, praised cessation from cigarettes.    4-6 month follow up to monitor BP  Future fasting labs  Orders Placed This Encounter  Procedures  . CBC    [place]    Standing Status: Future     Number of Occurrences:      Standing Expiration Date: 07/22/2015  . Comprehensive metabolic panel    [place]    Standing Status: Future     Number of Occurrences:      Standing Expiration Date: 07/22/2015    Order Specific Question:  Has the patient fasted?    Answer:  No  . Lipid panel    [place]    Standing Status: Future     Number of Occurrences:      Standing Expiration Date:  07/22/2015    Order Specific Question:  Has the patient fasted?    Answer:  No  . TSH    [place]    Standing Status: Future     Number of Occurrences:      Standing Expiration Date: 07/22/2015  . HIV antibody (with reflex)    Standing Status: Future     Number of Occurrences:      Standing Expiration Date:  07/22/2015  . POCT urinalysis dipstick    Standing Status: Future     Number of Occurrences:      Standing Expiration Date: 07/22/2015   >50% of 30 minute office visit was spent on counseling (quitting dip, DASH diet, BP goals) and coordination of care

## 2014-07-22 NOTE — Assessment & Plan Note (Signed)
Mild poor systolic control on metoprolol 100mg . Discussed last 2 visits have been well controlled. As first elevation, we decided to continue metoprolol and add DASH diet, if poor control at follow up would need additional BP support.

## 2014-07-22 NOTE — Assessment & Plan Note (Signed)
Advised cessation of dip, praised cessation from cigarettes.

## 2014-07-22 NOTE — Patient Instructions (Addendum)
Return for fasting labs in the next few weeks  See me 4-6 months to check in on blood pressure. Dash eating plan may help you lower this without medication. If it stays elevated, may need to add additional medicatoin.   DASH Eating Plan DASH stands for "Dietary Approaches to Stop Hypertension." The DASH eating plan is a healthy eating plan that has been shown to reduce high blood pressure (hypertension). Additional health benefits may include reducing the risk of type 2 diabetes mellitus, heart disease, and stroke. The DASH eating plan may also help with weight loss. WHAT DO I NEED TO KNOW ABOUT THE DASH EATING PLAN? For the DASH eating plan, you will follow these general guidelines:  Choose foods with a percent daily value for sodium of less than 5% (as listed on the food label).  Use salt-free seasonings or herbs instead of table salt or sea salt.  Check with your health care provider or pharmacist before using salt substitutes.  Eat lower-sodium products, often labeled as "lower sodium" or "no salt added."  Eat fresh foods.  Eat more vegetables, fruits, and low-fat dairy products.  Choose whole grains. Look for the word "whole" as the first word in the ingredient list.  Choose fish and skinless chicken or [place] more often than red meat. Limit fish, poultry, and meat to 6 oz (170 g) each day.  Limit sweets, desserts, sugars, and sugary drinks.  Choose heart-healthy fats.  Limit cheese to 1 oz (28 g) per day.  Eat more home-cooked food and less restaurant, buffet, and fast food.  Limit fried foods.  Cook foods using methods other than frying.  Limit canned vegetables. If you do use them, rinse them well to decrease the sodium.  When eating at a restaurant, ask that your food be prepared with less salt, or no salt if possible. WHAT FOODS CAN I EAT? Seek help from a dietitian for individual calorie needs. Grains Whole grain or whole wheat bread. Brown rice. Whole grain or  whole wheat pasta. Quinoa, bulgur, and whole grain cereals. Low-sodium cereals. Corn or whole wheat flour tortillas. Whole grain cornbread. Whole grain crackers. Low-sodium crackers. Vegetables Fresh or frozen vegetables (raw, steamed, roasted, or grilled). Low-sodium or reduced-sodium tomato and vegetable juices. Low-sodium or reduced-sodium tomato sauce and paste. Low-sodium or reduced-sodium canned vegetables.  Fruits All fresh, canned (in natural juice), or frozen fruits. Meat and Other Protein Products Ground beef (85% or leaner), grass-fed beef, or beef trimmed of fat. Skinless chicken or [place]. Ground chicken or [place]. Pork trimmed of fat. All fish and seafood. Eggs. Dried beans, peas, or lentils. Unsalted nuts and seeds. Unsalted canned beans. Dairy Low-fat dairy products, such as skim or 1% milk, 2% or reduced-fat cheeses, low-fat ricotta or cottage cheese, or plain low-fat yogurt. Low-sodium or reduced-sodium cheeses. Fats and Oils Tub margarines without trans fats. Light or reduced-fat mayonnaise and salad dressings (reduced sodium). Avocado. Safflower, olive, or canola oils. Natural peanut or almond butter. Other Unsalted popcorn and pretzels. The items listed above may not be a complete list of recommended foods or beverages. Contact your dietitian for more options. WHAT FOODS ARE NOT RECOMMENDED? Grains White bread. White pasta. White rice. Refined cornbread. Bagels and croissants. Crackers that contain trans fat. Vegetables Creamed or fried vegetables. Vegetables in a cheese sauce. Regular canned vegetables. Regular canned tomato sauce and paste. Regular tomato and vegetable juices. Fruits Dried fruits. Canned fruit in light or heavy syrup. Fruit juice. Meat and Other Protein Products Fatty  cuts of meat. Ribs, chicken wings, bacon, sausage, bologna, salami, chitterlings, fatback, hot dogs, bratwurst, and packaged luncheon meats. Salted nuts and seeds. Canned beans with  salt. Dairy Whole or 2% milk, cream, half-and-half, and cream cheese. Whole-fat or sweetened yogurt. Full-fat cheeses or blue cheese. Nondairy creamers and whipped toppings. Processed cheese, cheese spreads, or cheese curds. Condiments Onion and garlic salt, seasoned salt, table salt, and sea salt. Canned and packaged gravies. Worcestershire sauce. Tartar sauce. Barbecue sauce. Teriyaki sauce. Soy sauce, including reduced sodium. Steak sauce. Fish sauce. Oyster sauce. Cocktail sauce. Horseradish. Ketchup and mustard. Meat flavorings and tenderizers. Bouillon cubes. Hot sauce. Tabasco sauce. Marinades. Taco seasonings. Relishes. Fats and Oils Butter, stick margarine, lard, shortening, ghee, and bacon fat. Coconut, palm kernel, or palm oils. Regular salad dressings. Other Pickles and olives. Salted popcorn and pretzels. The items listed above may not be a complete list of foods and beverages to avoid. Contact your dietitian for more information. WHERE CAN I FIND MORE INFORMATION? National Heart, Lung, and Blood Institute: travelstabloid.com Document Released: 04/20/2011 Document Revised: 09/15/2013 Document Reviewed: 03/05/2013 [facility] Patient Information 2015 [address], [place]. This information is not intended to replace advice given to you by your health care provider. Make sure you discuss any questions you have with your health care provider.

## 2014-07-28 ENCOUNTER — Other Ambulatory Visit (INDEPENDENT_AMBULATORY_CARE_PROVIDER_SITE_OTHER): Payer: 59

## 2014-07-28 DIAGNOSIS — E785 Hyperlipidemia, unspecified: Secondary | ICD-10-CM

## 2014-07-28 DIAGNOSIS — Z7251 High risk heterosexual behavior: Secondary | ICD-10-CM

## 2014-07-28 DIAGNOSIS — Z87891 Personal history of nicotine dependence: Secondary | ICD-10-CM

## 2014-07-28 DIAGNOSIS — I10 Essential (primary) hypertension: Secondary | ICD-10-CM

## 2014-07-28 LAB — TSH: TSH: 2.83 u[IU]/mL (ref 0.35–4.50)

## 2014-07-28 LAB — LIPID PANEL
CHOLESTEROL: 149 mg/dL (ref 0–200)
HDL: 35.5 mg/dL — ABNORMAL LOW (ref >39.00)
LDL Cholesterol: 81 mg/dL (ref 0–99)
NonHDL: 113.5
TRIGLYCERIDES: 162 mg/dL — AB (ref 0.0–149.0)
Total CHOL/HDL Ratio: 4
VLDL: 32.4 mg/dL (ref 0.0–40.0)

## 2014-07-28 LAB — COMPREHENSIVE METABOLIC PANEL
ALBUMIN: 4.5 g/dL (ref 3.5–5.2)
ALK PHOS: 53 U/L (ref 39–117)
ALT: 40 U/L (ref 0–53)
AST: 28 U/L (ref 0–37)
BILIRUBIN TOTAL: 0.9 mg/dL (ref 0.2–1.2)
BUN: 14 mg/dL (ref 6–23)
CO2: 28 mEq/L (ref 19–32)
Calcium: 9.3 mg/dL (ref 8.4–10.5)
Chloride: 107 mEq/L (ref 96–112)
Creatinine, Ser: 1.02 mg/dL (ref 0.40–1.50)
GFR: 80.65 mL/min (ref >60.00)
GLUCOSE: 99 mg/dL (ref 70–99)
POTASSIUM: 4.2 meq/L (ref 3.5–5.1)
SODIUM: 140 meq/L (ref 135–145)
TOTAL PROTEIN: 6.7 g/dL (ref 6.0–8.3)

## 2014-07-28 LAB — POCT URINALYSIS DIPSTICK
GLUCOSE UA: NEGATIVE
KETONES UA: NEGATIVE
Leukocytes, UA: NEGATIVE
Nitrite, UA: NEGATIVE
Protein, UA: NEGATIVE
SPEC GRAV UA: 1.02
Urobilinogen, UA: 0.2
pH, UA: 5.5

## 2014-07-28 LAB — CBC
HCT: 51.4 % (ref 39.0–52.0)
Hemoglobin: 18.2 g/dL (ref 13.0–17.0)
MCHC: 35.4 g/dL (ref 30.0–36.0)
MCV: 91.5 fl (ref 78.0–100.0)
PLATELETS: 142 10*3/uL — AB (ref 150.0–400.0)
RBC: 5.62 Mil/uL (ref 4.22–5.81)
RDW: 13 % (ref 11.5–15.5)
WBC: 5.9 10*3/uL (ref 4.0–10.5)

## 2014-07-29 LAB — HIV ANTIBODY (ROUTINE TESTING W REFLEX): HIV: NONREACTIVE

## 2014-10-30 ENCOUNTER — Ambulatory Visit (INDEPENDENT_AMBULATORY_CARE_PROVIDER_SITE_OTHER): Payer: 59 | Admitting: Adult Health

## 2014-10-30 ENCOUNTER — Encounter: Payer: Self-pay | Admitting: Adult Health

## 2014-10-30 VITALS — BP 140/90 | Temp 98.6°F | Ht 67.0 in | Wt 177.9 lb

## 2014-10-30 DIAGNOSIS — R319 Hematuria, unspecified: Secondary | ICD-10-CM | POA: Diagnosis not present

## 2014-10-30 DIAGNOSIS — K640 First degree hemorrhoids: Secondary | ICD-10-CM

## 2014-10-30 DIAGNOSIS — K649 Unspecified hemorrhoids: Secondary | ICD-10-CM | POA: Insufficient documentation

## 2014-10-30 LAB — URINALYSIS, MICROSCOPIC ONLY

## 2014-10-30 MED ORDER — HYDROCORTISONE ACE-PRAMOXINE 1-1 % RE CREA: 1.0000 "application " | TOPICAL_CREAM | Freq: Two times a day (BID) | RECTAL | Status: DC

## 2014-10-30 NOTE — Addendum Note (Signed)
Addended by: Colleen Can on: 10/30/2014 04:32 PM   Modules accepted: Orders

## 2014-10-30 NOTE — Progress Notes (Signed)
Pre visit review using our clinic review tool, if applicable. No additional management support is needed unless otherwise documented below in the visit note. 

## 2014-10-30 NOTE — Patient Instructions (Addendum)
I have sent a prescription to the pharmacy for a steroid cream. Please use this instead of the Preparation H. Ibuprofen 600mg  with help with the pain and inflammation. Add a fiber supplement to your diet. You can also use Sitz baths to help with the itching and spasms.   Let me know if your symptoms do not improve in a week.   Hemorrhoids Hemorrhoids are swollen veins around the rectum or anus. There are two types of hemorrhoids:   Internal hemorrhoids. These occur in the veins just inside the rectum. They may poke through to the outside and become irritated and painful.  External hemorrhoids. These occur in the veins outside the anus and can be felt as a painful swelling or hard lump near the anus. CAUSES  Pregnancy.   Obesity.   Constipation or diarrhea.   Straining to have a bowel movement.   Sitting for long periods on the toilet.  Heavy lifting or other activity that caused you to strain.  Anal intercourse. SYMPTOMS   Pain.   Anal itching or irritation.   Rectal bleeding.   Fecal leakage.   Anal swelling.   One or more lumps around the anus.  DIAGNOSIS  Your caregiver may be able to diagnose hemorrhoids by visual examination. Other examinations or tests that may be performed include:   Examination of the rectal area with a gloved hand (digital rectal exam).   Examination of anal canal using a small tube (scope).   A blood test if you have lost a significant amount of blood.  A test to look inside the colon (sigmoidoscopy or colonoscopy). TREATMENT Most hemorrhoids can be treated at home. However, if symptoms do not seem to be getting better or if you have a lot of rectal bleeding, your caregiver may perform a procedure to help make the hemorrhoids get smaller or remove them completely. Possible treatments include:   Placing a rubber band at the base of the hemorrhoid to cut off the circulation (rubber band ligation).   Injecting a chemical to  shrink the hemorrhoid (sclerotherapy).   Using a tool to burn the hemorrhoid (infrared light therapy).   Surgically removing the hemorrhoid (hemorrhoidectomy).   Stapling the hemorrhoid to block blood flow to the tissue (hemorrhoid stapling).  HOME CARE INSTRUCTIONS   Eat foods with fiber, such as whole grains, beans, nuts, fruits, and vegetables. Ask your doctor about taking products with added fiber in them (fibersupplements).  Increase fluid intake. Drink enough water and fluids to keep your urine clear or pale yellow.   Exercise regularly.   Go to the bathroom when you have the urge to have a bowel movement. Do not wait.   Avoid straining to have bowel movements.   Keep the anal area dry and clean. Use wet toilet paper or moist towelettes after a bowel movement.   Medicated creams and suppositories may be used or applied as directed.   Only take over-the-counter or prescription medicines as directed by your caregiver.   Take warm sitz baths for 15-20 minutes, 3-4 times a day to ease pain and discomfort.   Place ice packs on the hemorrhoids if they are tender and swollen. Using ice packs between sitz baths may be helpful.   Put ice in a plastic bag.   Place a towel between your skin and the bag.   Leave the ice on for 15-20 minutes, 3-4 times a day.   Do not use a donut-shaped pillow or sit on the toilet for  long periods. This increases blood pooling and pain.  SEEK MEDICAL CARE IF:  You have increasing pain and swelling that is not controlled by treatment or medicine.  You have uncontrolled bleeding.  You have difficulty or you are unable to have a bowel movement.  You have pain or inflammation outside the area of the hemorrhoids. MAKE SURE YOU:  Understand these instructions.  Will watch your condition.  Will get help right away if you are not doing well or get worse. Document Released: 04/28/2000 Document Revised: 04/17/2012 Document  Reviewed: 03/05/2012 [facility] Patient Information 2015 [place], [place]. This information is not intended to replace advice given to you by your health care provider. Make sure you discuss any questions you have with your health care provider.

## 2014-10-30 NOTE — Progress Notes (Signed)
Subjective:    Patient ID: Jordan Cantrell, male    DOB: 06-11-1960, 54 y.o.   MRN: [id number]  HPI   No presents to the office today for evaluation of hemorrhoids. The patient he's had pain and itching for the last 2 or 3 months in his rectal area. At its worst the pain is a 3 out of 10. Symptom relief she's been using preparation H. Pain is worse after a bowel movement area. Itching is constant. He denies any bright red blood per rectum. Does have a history of anal fissure in the past.   He has no other complaints   Review of Systems  Constitutional: Negative.   Gastrointestinal: Positive for rectal pain. Negative for blood in stool and anal bleeding.       Anal itching  Genitourinary: Negative.   Skin: Negative.   Hematological: Negative.   All other systems reviewed and are negative.  Past Medical History  Diagnosis Date  . Chest pain, unspecified     2007  . Other synovitis and tenosynovitis     Right Elbow  . Balanoposthitis     Mild  . Dry eye syndrome   . History of shingles   . Allergic rhinitis, cause unspecified   . Migraine, unspecified, without mention of intractable migraine without mention of status migrainosus   . TINNITUS NOS 01/31/2008    Qualifier: Diagnosis of  By: Plotnikov MD, Evie Lacks   . Constipation 09/24/2009    Prn metamucil    . HEMORRHOID, EXTERNAL, THROMBOSED 05/06/2010    Qualifier: Diagnosis of  By: Ronnald Ramp MD, Arvid Right.     History   Social History  . Marital Status: Married    Spouse Name: N/A  . Number of Children: 2  . Years of Education: 16   Occupational History  . Geophysicist/field seismologist Tobacco   Social History Main Topics  . Smoking status: Former Smoker -- 0.10 packs/day    Types: Cigarettes    Quit date: 04/28/2014  . Smokeless tobacco: Current User    Types: Snuff     Comment: multiple recurrences, watch closely  . Alcohol Use: 6.0 - 7.2 oz/week    10-12 Standard drinks or equivalent per week     Comment: beer  . Drug  Use: No  . Sexual Activity:    Partners: Female   Other Topics Concern  . Not on file   Social History Narrative   Married-'81. 1 dtr - '87, 1 son - '83, 2 granddaughters   Marriage in good health.       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       Hobbies: time with grandkids, work with cows, minimal travel due to pain    Past Surgical History  Procedure Laterality Date  . Orif femur fracture  1993    Right  . Knee arthroscopy  2011    Right for Condromalacia  . Foot fracture surgery  1993    right-crushed all 4 toes reportedly    Family History  Problem Relation Age of Onset  . Diabetes type II Mother   . Heart disease Mother     CABG-died during this, smoker  . Hyperlipidemia Mother   . Hypertension Mother   . Diabetes Father   . Heart disease Father     thought death due to this, smoker  . Hypertension  Father   . Schizophrenia Father   . Colon polyps Sister   . Colon cancer Neg Hx   . Stomach cancer Neg Hx   . Esophageal cancer Neg Hx   . Rectal cancer Neg Hx     Allergies  Allergen Reactions  . Codeine Nausea Only    Current Outpatient Prescriptions on File Prior to Visit  Medication Sig Dispense Refill  . aspirin 81 MG tablet Take 81 mg by mouth daily.    . metoprolol succinate (TOPROL-XL) 50 MG 24 hr tablet TAKE 2 TABLETS BY MOUTH EVERY DAY 60 tablet 3  . SUMAtriptan (IMITREX) 50 MG tablet TAKE 1 TABLET BY MOUTH AS NEEDED MIGRAINE 6 tablet 0  . fluticasone (FLONASE) 50 MCG/ACT nasal spray Place 2 sprays into the nose daily. 16 g 6   No current facility-administered medications on file prior to visit.    BP 140/90 mmHg  Temp(Src) 98.6 F (37 C) (Oral)  Ht 5\' 7"  (1.702 m)  Wt 177 lb 14.4 oz (80.695 kg)  BMI 27.86 kg/m2       Objective:   Physical Exam  Constitutional: He appears well-developed and well-nourished. No distress.    Cardiovascular: Normal rate, regular rhythm, normal heart sounds and intact distal pulses.  Exam reveals no gallop and no friction rub.   No murmur heard. Pulmonary/Chest: Effort normal and breath sounds normal. No respiratory distress. He has no wheezes. He has no rales. He exhibits no tenderness.  Abdominal: Soft. Bowel sounds are normal. He exhibits no distension and no mass. There is no tenderness. There is no rebound and no guarding.  Genitourinary: Guaiac negative stool.  Has approx 3 small external hemorrhoids and one internal hemorrhoid. There was no abscess, prolapsed hemorrhoid, or thrombosed hemorrhoid. There was no bright red blood per rectum.  Skin: He is not diaphoretic.  Nursing note and vitals reviewed.      Assessment & Plan:  1. First degree hemorrhoids - pramoxine-hydrocortisone (ANALPRAM-HC) 1-1 % rectal cream; Place 1 application rectally 2 (two) times daily.  Dispense: 30 g; Refill: 0. Use no longer than two weeks - Add daily fiber supplement.  - Ibuprofen for pain and inflammation  - Sitz bath twice a day for one week.  - Follow up if no improvement or symptoms become worse.

## 2014-11-05 ENCOUNTER — Encounter: Payer: Self-pay | Admitting: Family Medicine

## 2014-11-14 ENCOUNTER — Other Ambulatory Visit: Payer: Self-pay | Admitting: Family Medicine

## 2014-12-17 ENCOUNTER — Encounter: Payer: Self-pay | Admitting: Gastroenterology

## 2015-01-26 ENCOUNTER — Encounter: Payer: Self-pay | Admitting: Family Medicine

## 2015-01-29 ENCOUNTER — Encounter: Payer: Self-pay | Admitting: Family Medicine

## 2015-01-29 ENCOUNTER — Ambulatory Visit (INDEPENDENT_AMBULATORY_CARE_PROVIDER_SITE_OTHER): Payer: Commercial Managed Care - HMO | Admitting: Family Medicine

## 2015-01-29 VITALS — BP 130/84 | HR 65 | Temp 98.5°F | Wt 177.0 lb

## 2015-01-29 DIAGNOSIS — R21 Rash and other nonspecific skin eruption: Secondary | ICD-10-CM | POA: Diagnosis not present

## 2015-01-29 MED ORDER — TRIAMCINOLONE ACETONIDE 0.5 % EX CREA: 1.0000 "application " | TOPICAL_CREAM | Freq: Two times a day (BID) | CUTANEOUS | Status: DC

## 2015-01-29 NOTE — Progress Notes (Signed)
[name], MD  Subjective:  Jordan Cantrell is a 54 y.o. year old very pleasant male patient who presents with:  Rash -6 weeks ago mowing yard- poison oak contact on left hand/forearm. History of prolonged issues overcoming these taking many weeks. Very itchy. 3 spots on dorsum of hand/wrsist, one spot Ventral forearm. L medial lower leg. Mild improvement but not resolving. Admits to scratching areas hard and causing excoriation.  ROS-not ill appearing, no fever/chills. No new medications. Not immunocompromised. No mucus membrane involvement.   Past Medical History-  Patient Active Problem List   Diagnosis Date Noted  . Tobacco abuse 11/02/2011    Priority: High  . Essential hypertension 07/22/2014    Priority: Medium  . Insomnia 11/12/2012    Priority: Medium  . Hypertension 11/12/2012    Priority: Medium  . Migraine 09/30/2007    Priority: Medium  . Osteoarthritis 07/22/2014    Priority: Low  . History of colonic polyps 07/22/2014    Priority: Low  . Urinary frequency 09/11/2011    Priority: Low  . LOW BACK PAIN SYNDROME 09/24/2009    Priority: Low  . Dry eyes 09/30/2007    Priority: Low  . Allergic rhinitis 09/30/2007    Priority: Low  . History of shingles 09/30/2007    Priority: Low  . Hemorrhoid 10/30/2014     Medications- reviewed and updated Current Outpatient Prescriptions  Medication Sig Dispense Refill  . aspirin 81 MG tablet Take 81 mg by mouth daily.    . metoprolol succinate (TOPROL-XL) 50 MG 24 hr tablet TAKE 2 TABLETS BY MOUTH EVERY DAY 60 tablet 3  . fluticasone (FLONASE) 50 MCG/ACT nasal spray Place 2 sprays into the nose daily. 16 g 6  . pramoxine-hydrocortisone (ANALPRAM-HC) 1-1 % rectal cream Place 1 application rectally 2 (two) times daily. (Patient not taking: Reported on 01/29/2015) 30 g 0  . SUMAtriptan (IMITREX) 50 MG tablet TAKE 1 TABLET BY MOUTH AS NEEDED MIGRAINE (Patient not taking: Reported on 01/29/2015) 6 tablet 0   Objective: BP  130/84 mmHg  Pulse 65  Temp(Src) 98.5 F (36.9 C)  Wt 177 lb (80.287 kg) Gen: NAD, resting comfortably CV: RRR no murmurs rubs or gallops Lungs: CTAB no crackles, wheeze, rhonchi Abdomen: soft/nontender/nondistended/normal bowel sounds. No rebound or guarding.  Ext: no edema Skin: warm, dry,  3 erythematous papules vs. vesicles on dorsum of hand/wrsist- unclear due to excoriation, one spot Ventral forearm. L medial lower leg similar lesion. Neuro: grossly normal, moves all extremities  Assessment/Plan:  Rash Atypical duration for contact dermatitis but patient has had similar issues in past. We will try high potency triamcinolone x 10 days (discussed hypopigmentation) and if no resolution return to care- consider depo- medrol as next trial (his initial request today). Would refer to derm if did not resolve after that. I wonder if some of this is more of the itch scratch cycle that has been created due to his scratching.   Return precautions advised.   Meds ordered this encounter  Medications  . triamcinolone cream (KENALOG) 0.5 %    Sig: Apply 1 application topically 2 (two) times daily. For 10 days to affected area    Dispense:  30 g    Refill:  1

## 2015-01-29 NOTE — Patient Instructions (Signed)
Use potent steroid cream for 10 days twice a day. If this does not knock this out completely, can use injected steroid and if that does not work would get dermatology's opinion. See [place] if symptoms worsen or if you have new lesions

## 2015-03-24 ENCOUNTER — Other Ambulatory Visit: Payer: Self-pay | Admitting: Family Medicine

## 2015-08-04 ENCOUNTER — Other Ambulatory Visit: Payer: Self-pay | Admitting: Family Medicine

## 2015-09-24 ENCOUNTER — Ambulatory Visit (INDEPENDENT_AMBULATORY_CARE_PROVIDER_SITE_OTHER): Payer: Commercial Managed Care - HMO | Admitting: Family Medicine

## 2015-09-24 ENCOUNTER — Encounter: Payer: Self-pay | Admitting: Family Medicine

## 2015-09-24 VITALS — BP 140/82 | HR 99 | Temp 98.1°F | Wt 178.0 lb

## 2015-09-24 DIAGNOSIS — J029 Acute pharyngitis, unspecified: Secondary | ICD-10-CM

## 2015-09-24 LAB — POCT RAPID STREP A (OFFICE): Rapid Strep A Screen: NEGATIVE

## 2015-09-24 NOTE — Patient Instructions (Signed)
Viral pharyngitis History and exam today are suggestive of viral infection. Strep test was negative. I would not be surprised if patient develops cough, congestion along with this in next few days. Antibiotics will not help this as it is a virus. If patient has persistent symptoms past 10 days, symptoms that continue to worsen through the weekend, or fever then please return to see [place].   Symptomatic treatment with: scheduled tylenol 650 mg every 6 hours. Can use sparing ibuprofen as well (raises cardiac risk so want to use minimally)

## 2015-09-24 NOTE — Progress Notes (Signed)
PCP: [name], MD  Subjective:  Jordan Cantrell is a 55 y.o. year old very pleasant male patient who presents with symptoms including sore throat starting Wednesday night. Wed ST. Worsening. Subjective warm  at work but admits warm at work. Feels fatigued. Denies cough, congestion. Has had several tick bites at home working in yard. Neti pots are keeping sinuses mainly clear, does have some drainage. No headaches, body aches, rash or true fever.  -previous treatments: none -sick contacts/travel/risks: denies flu exposure.   ROS-denies fever, SOB, NVD, tooth pain  Pertinent Past Medical History-  Patient Active Problem List   Diagnosis Date Noted  . Tobacco abuse 11/02/2011    Priority: High  . Essential hypertension 07/22/2014    Priority: Medium  . Insomnia 11/12/2012    Priority: Medium  . Hypertension 11/12/2012    Priority: Medium  . Migraine 09/30/2007    Priority: Medium  . Osteoarthritis 07/22/2014    Priority: Low  . History of colonic polyps 07/22/2014    Priority: Low  . Urinary frequency 09/11/2011    Priority: Low  . LOW BACK PAIN SYNDROME 09/24/2009    Priority: Low  . Dry eyes 09/30/2007    Priority: Low  . Allergic rhinitis 09/30/2007    Priority: Low  . History of shingles 09/30/2007    Priority: Low  . Hemorrhoid 10/30/2014   Medications- reviewed  Current Outpatient Prescriptions  Medication Sig Dispense Refill  . aspirin 81 MG tablet Take 81 mg by mouth daily.    . fluticasone (FLONASE) 50 MCG/ACT nasal spray Place 2 sprays into the nose daily. 16 g 6  . metoprolol succinate (TOPROL-XL) 50 MG 24 hr tablet TAKE 2 TABLETS BY MOUTH EVERY DAY 60 tablet 3  . pramoxine-hydrocortisone (ANALPRAM-HC) 1-1 % rectal cream Place 1 application rectally 2 (two) times daily. (Patient not taking: Reported on 01/29/2015) 30 g 0  . SUMAtriptan (IMITREX) 50 MG tablet TAKE 1 TABLET BY MOUTH AS NEEDED MIGRAINE (Patient not taking: Reported on 01/29/2015) 6 tablet 0   . triamcinolone cream (KENALOG) 0.5 % Apply 1 application topically 2 (two) times daily. For 10 days to affected area 30 g 1   No current facility-administered medications for this visit.    Objective: BP 140/82 mmHg  Pulse 99  Temp(Src) 98.1 F (36.7 C)  Wt 178 lb (80.74 kg)  SpO2 96% Gen: NAD, resting comfortably, appears fatigued HEENT: Turbinates erythematous, TM normal, pharynx moderately erythematous with no tonsilar exudate or edema, no sinus tenderness.  Slight nontender cervical lymphadenopathy on left CV: RRR no murmurs rubs or gallops Lungs: CTAB no crackles, wheeze, rhonchi Abdomen: soft/nontender/nondistended/normal bowel sounds. No rebound or guarding.  Ext: no edema Skin: warm, dry, no rash, several very small erythematous patches in areas of prior tick bites Neuro: grossly normal, moves all extremities  Results for orders placed or performed in visit on 09/24/15 (from the past 24 hour(s))  POC Rapid Strep A     Status: None   Collection Time: 09/24/15 12:10 PM  Result Value Ref Range   Rapid Strep A Screen Negative Negative   Assessment/Plan:  Viral pharyngitis History and exam today are suggestive of viral infection. Strep test was negative. I would not be surprised if patient develops cough, congestion along with this in next few days. Antibiotics will not help this as it is a virus. If patient has persistent symptoms past 10 days, symptoms that continue to worsen through the weekend, or fever then please return to  see [place].   He has several tick bites but no significant symptoms to warrant concern for RMSF or lyme disease- he will monitor for flu like symptoms and return to care if this occurs. Would not likely present as primarily sore throat.   Symptomatic treatment with: scheduled tylenol 650 mg every 6 hours. Can use sparing ibuprofen as well (raises cardiac risk so want to use minimally)  The duration of face-to-face time during this visit was 15 minutes.  Greater than 50% of this time was spent in counseling, explanation of diagnosis, planning of further management, and/or coordination of care.   [name], MD

## 2015-09-28 ENCOUNTER — Encounter: Payer: Self-pay | Admitting: Family Medicine

## 2015-09-28 ENCOUNTER — Ambulatory Visit (INDEPENDENT_AMBULATORY_CARE_PROVIDER_SITE_OTHER): Payer: Commercial Managed Care - HMO | Admitting: Family Medicine

## 2015-09-28 VITALS — BP 128/76 | HR 88 | Temp 98.5°F | Ht 67.0 in | Wt 177.0 lb

## 2015-09-28 DIAGNOSIS — T783XXA Angioneurotic edema, initial encounter: Secondary | ICD-10-CM

## 2015-09-28 MED ORDER — PREDNISONE 20 MG PO TABS: ORAL_TABLET | ORAL | Status: DC

## 2015-09-28 MED ORDER — EPINEPHRINE 0.3 MG/0.3ML IJ SOAJ: 0.3000 mg | Freq: Once | INTRAMUSCULAR | Status: DC

## 2015-09-28 MED ORDER — CETIRIZINE HCL 10 MG PO TABS
10.0000 mg | ORAL_TABLET | Freq: Every day | ORAL | Status: DC
Start: 2015-09-28 — End: 2015-12-03

## 2015-09-28 NOTE — Patient Instructions (Signed)
Labs before you leave- testing for a red meat reaction after tick bite called alpha gal  For swelling of tongue: Take prednisone course Start zyrtec daily until gone Schedule benadryl every 6 hours  If worsening tongue swelling or shortness of breath- seek care immediately by calling 911. Sent in epi pen to be extra cautious in case needed in emergent setting    Angioedema Angioedema is a sudden swelling of tissues, often of the skin. It can occur on the face or genitals or in the abdomen or other body parts. The swelling usually develops over a short period and gets better in 24 to 48 hours. It often begins during the night and is found when the person wakes up. The person may also get red, itchy patches of skin (hives). Angioedema can be dangerous if it involves swelling of the air passages.  Depending on the cause, episodes of angioedema may only happen once, come back in unpredictable patterns, or repeat for several years and then gradually fade away.  CAUSES  Angioedema can be caused by an allergic reaction to various triggers. It can also result from nonallergic causes, including reactions to drugs, immune system disorders, viral infections, or an abnormal gene that is passed to you from your parents (hereditary). For some people with angioedema, the cause is unknown.  Some things that can trigger angioedema include:   Foods.   Medicines, such as ACE inhibitors, ARBs, nonsteroidal anti-inflammatory agents, or estrogen.   Latex.   Animal saliva.   Insect stings.   Dyes used in X-rays.   Mild injury.   Dental work.  Surgery.  Stress.   Sudden changes in temperature.   Exercise. SIGNS AND SYMPTOMS   Swelling of the skin.  Hives. If these are present, there is also intense itching.  Redness in the affected area.   Pain in the affected area.  Swollen lips or tongue.  Breathing problems. This may happen if the air passages swell.  Wheezing. If  internal organs are involved, there may be:   Nausea.   Abdominal pain.   Vomiting.   Difficulty swallowing.   Difficulty passing urine. DIAGNOSIS   Your health care provider will examine the affected area and take a medical and family history.  Various tests may be done to help determine the cause. Tests may include:  Allergy skin tests to see if the problem is an allergic reaction.   Blood tests to check for hereditary angioedema.   Tests to check for underlying diseases that could cause the condition.   A review of your medicines, including over-the-counter medicines, may be done. TREATMENT  Treatment will depend on the cause of the angioedema. Possible treatments include:   Removal of anything that triggered the condition (such as stopping certain medicines).   Medicines to treat symptoms or prevent attacks. Medicines given may include:   Antihistamines.   Epinephrine injection.   Steroids.   Hospitalization may be required for severe attacks. If the air passages are affected, it can be an emergency. Tubes may need to be placed to keep the airway open. HOME CARE INSTRUCTIONS   Take all medicines as directed by your health care provider.  If you were given medicines for emergency allergy treatment, always carry them with you.  Wear a medical bracelet as directed by your health care provider.   Avoid known triggers. SEEK MEDICAL CARE IF:   You have repeat attacks of angioedema.   Your attacks are more frequent or more severe despite  preventive measures.   You have hereditary angioedema and are considering having children. It is important to discuss with your health care provider the risks of passing the condition on to your children. SEEK IMMEDIATE MEDICAL CARE IF:   You have severe swelling of the mouth, tongue, or lips.  You have difficulty breathing.   You have difficulty swallowing.   You faint. MAKE SURE YOU:  Understand these  instructions.  Will watch your condition.  Will get help right away if you are not doing well or get worse.   This information is not intended to replace advice given to you by your health care provider. Make sure you discuss any questions you have with your health care provider.   Document Released: 07/10/2001 Document Revised: 05/22/2014 Document Reviewed: 12/23/2012 Elsevier Interactive Patient Education [employer].

## 2015-09-28 NOTE — Progress Notes (Signed)
Subjective:  Jordan Cantrell is a 55 y.o. year old very pleasant male patient who presents for/with See problem oriented charting ROS- see any ROS included in HPI as well.   Past Medical History-  Patient Active Problem List   Diagnosis Date Noted  . Tobacco abuse 11/02/2011    Priority: High  . Essential hypertension 07/22/2014    Priority: Medium  . Insomnia 11/12/2012    Priority: Medium  . Hypertension 11/12/2012    Priority: Medium  . Migraine 09/30/2007    Priority: Medium  . Osteoarthritis 07/22/2014    Priority: Low  . History of colonic polyps 07/22/2014    Priority: Low  . Urinary frequency 09/11/2011    Priority: Low  . LOW BACK PAIN SYNDROME 09/24/2009    Priority: Low  . Dry eyes 09/30/2007    Priority: Low  . Allergic rhinitis 09/30/2007    Priority: Low  . History of shingles 09/30/2007    Priority: Low  . Hemorrhoid 10/30/2014    Medications- reviewed and updated Current Outpatient Prescriptions  Medication Sig Dispense Refill  . Ascorbic Acid (VITAMIN C PO) Take by mouth.    Marland Kitchen aspirin 81 MG tablet Take 81 mg by mouth daily.    . meloxicam (MOBIC) 15 MG tablet Take 15 mg by mouth daily.  2  . metoprolol succinate (TOPROL-XL) 50 MG 24 hr tablet TAKE 2 TABLETS BY MOUTH EVERY DAY 60 tablet 3  . Multiple Vitamin (MULTI VITAMIN PO) Take by mouth.    . pramoxine-hydrocortisone (ANALPRAM-HC) 1-1 % rectal cream Place 1 application rectally 2 (two) times daily. 30 g 0  . SUMAtriptan (IMITREX) 50 MG tablet TAKE 1 TABLET BY MOUTH AS NEEDED MIGRAINE 6 tablet 0  . triamcinolone cream (KENALOG) 0.5 % Apply 1 application topically 2 (two) times daily. For 10 days to affected area 30 g 1  . cetirizine (ZYRTEC) 10 MG tablet Take 1 tablet (10 mg total) by mouth daily. 30 tablet 11  . EPINEPHrine 0.3 mg/0.3 mL IJ SOAJ injection Inject 0.3 mLs (0.3 mg total) into the muscle once. As needed for worsening tongue swelling, shortness of breath. Call 911 if need to use. 1 Device  0  . predniSONE (DELTASONE) 20 MG tablet Take 2 pills for 3 days, 1 pill for 4 days 10 tablet 0   No current facility-administered medications for this visit.    Objective: BP 128/76 mmHg  Pulse 88  Temp(Src) 98.5 F (36.9 C) (Oral)  Ht 5\' 7"  (1.702 m)  Wt 177 lb (80.287 kg)  BMI 27.72 kg/m2 Gen: NAD, resting comfortably Mild rhinorrhea, pharynx erythematous but improved from last visit, TM normal Tongue swelling noted but no lip swelling or pharyngeal swelling CV: RRR no murmurs rubs or gallops Lungs: CTAB no crackles, wheeze, rhonchi Abdomen: soft/nontender/nondistended/normal bowel sounds.  Ext: no edema Skin: warm, dry, no rash or hives Examined hands and feet specifically and no rash Neuro: grossly normal, moves all extremities  Assessment/Plan:  Angioedema S: started with cough, congestion, runny nose, coughing up yellow and blowing out yellow occasional drop of blood after leaving office last week with viral pharyngitis. We had discussed potentially could be early URI. . Trialed neti pot daily and helping some but not enough. Sore throat portion is improving.   Ate at a dinner before work but same thing he always eat (cheeseburger and fries around noon). He initially started with hands and feet itching. No new supplements or foods- turmeric for about a month. He also  had a honey lemon cough drop but had already used the same one (out of same bag). He took some benadryl at work and his tongue swelling has improved a reasonable amount. No drooling, no trouble swallowing, no trouble breathing.   Patient also mentioned several tick bites last visit. He does not have fever, headache, rash  ROS- no fever or chills, no nausea or vomiting. No trouble swallowing other than prior sore throat but it is improving.  A/P: High risk condition of angioedema but improved on benadryl. We discussed ER visit for monitoring but he has had stability/mild improvement since the benadryl. We will  add prednisone, zyrtec,  And schedule benadryl. Send patient home with epi pen and emergent return precautions given.   Due to tick exposure, check alpha gal panel as started after eating cheeseburger- should avoid red meat until testing is completed.   Given unknown etiology- could consider referral to allergist if above workup is negativeand if has recurrence  Patient will update me tomorrow morning through mychart. Emergent Return precautions advised. He is aware this may be a life threatening condition.   Orders Placed This Encounter  Procedures  . Alpha-Gal Panel    Meds ordered this encounter  Medications  . meloxicam (MOBIC) 15 MG tablet    Sig: Take 15 mg by mouth daily.    Refill:  2  . EPINEPHrine 0.3 mg/0.3 mL IJ SOAJ injection    Sig: Inject 0.3 mLs (0.3 mg total) into the muscle once. As needed for worsening tongue swelling, shortness of breath. Call 911 if need to use.    Dispense:  1 Device    Refill:  0  . predniSONE (DELTASONE) 20 MG tablet    Sig: Take 2 pills for 3 days, 1 pill for 4 days    Dispense:  10 tablet    Refill:  0  . cetirizine (ZYRTEC) 10 MG tablet    Sig: Take 1 tablet (10 mg total) by mouth daily.    Dispense:  30 tablet    Refill:  11    [name], MD

## 2015-09-29 ENCOUNTER — Telehealth: Payer: Self-pay | Admitting: Family Medicine

## 2015-09-29 ENCOUNTER — Encounter: Payer: Self-pay | Admitting: *Deleted

## 2015-09-29 ENCOUNTER — Encounter: Payer: Self-pay | Admitting: Family Medicine

## 2015-09-29 NOTE — Telephone Encounter (Signed)
Pt needs work note from 09-28-15 and return to work today 09-29-15 with no restrictions

## 2015-09-29 NOTE — Telephone Encounter (Signed)
Printed letter for patient per request.

## 2015-10-01 ENCOUNTER — Encounter: Payer: Self-pay | Admitting: Family Medicine

## 2015-10-01 LAB — ALPHA-GAL PANEL
Allergen, Mutton, f88: 0.88 kU/L — ABNORMAL HIGH
Allergen, Pork, f26: 0.86 kU/L — ABNORMAL HIGH
Beef: 2.56 kU/L — ABNORMAL HIGH
GALACTOSE-ALPHA-1, 3-GALACTOSE IGE: 93.4 kU/L — AB (ref <0.35)

## 2015-10-04 ENCOUNTER — Other Ambulatory Visit: Payer: Self-pay | Admitting: Family Medicine

## 2015-10-04 DIAGNOSIS — T7840XA Allergy, unspecified, initial encounter: Secondary | ICD-10-CM

## 2015-10-04 NOTE — Telephone Encounter (Signed)
Left message for patient to call back  

## 2015-10-07 NOTE — Telephone Encounter (Signed)
I called pt. Left message on voicemail for call back.  Per previous notes pt has already been contacted regarding results and referral to allergist.  This was a follow up phone call

## 2015-10-12 ENCOUNTER — Encounter: Payer: Self-pay | Admitting: Family Medicine

## 2015-10-12 MED ORDER — DOXYCYCLINE HYCLATE 100 MG PO TABS
100.0000 mg | ORAL_TABLET | Freq: Two times a day (BID) | ORAL | Status: DC
Start: 2015-10-12 — End: 2015-10-20

## 2015-10-18 ENCOUNTER — Encounter: Payer: Self-pay | Admitting: Family Medicine

## 2015-10-19 ENCOUNTER — Telehealth: Payer: Self-pay | Admitting: Family Medicine

## 2015-10-19 NOTE — Telephone Encounter (Signed)
Pt has been scheduled and aware to be here 7:45 am

## 2015-10-19 NOTE — Telephone Encounter (Signed)
Have him come in at 8 am and have him arrive at 7:45 so he can be in room at 8 am for me to see  Sending to [place] as is a change in my schedule

## 2015-10-19 NOTE — Telephone Encounter (Signed)
Pt needs to see your for med follow up. However, pt has to be at work at 3 pm and wanted to come in today or tomorrow. But there are not even any same day appointments. Please advise if you want to work him in. Today or tomorrow. (tommorrow your 1/2 day and you are fulll!) thanks

## 2015-10-20 ENCOUNTER — Ambulatory Visit (INDEPENDENT_AMBULATORY_CARE_PROVIDER_SITE_OTHER): Payer: Commercial Managed Care - HMO | Admitting: Family Medicine

## 2015-10-20 ENCOUNTER — Encounter: Payer: Self-pay | Admitting: Family Medicine

## 2015-10-20 ENCOUNTER — Other Ambulatory Visit: Payer: Self-pay | Admitting: Family Medicine

## 2015-10-20 VITALS — BP 144/88 | HR 71 | Temp 98.1°F | Ht 67.0 in | Wt 179.0 lb

## 2015-10-20 DIAGNOSIS — S30861D Insect bite (nonvenomous) of abdominal wall, subsequent encounter: Secondary | ICD-10-CM

## 2015-10-20 DIAGNOSIS — R21 Rash and other nonspecific skin eruption: Secondary | ICD-10-CM

## 2015-10-20 DIAGNOSIS — W57XXXA Bitten or stung by nonvenomous insect and other nonvenomous arthropods, initial encounter: Secondary | ICD-10-CM | POA: Diagnosis not present

## 2015-10-20 MED ORDER — DOXYCYCLINE HYCLATE 100 MG PO TABS: 100.0000 mg | ORAL_TABLET | Freq: Two times a day (BID) | ORAL | Status: DC

## 2015-10-20 MED ORDER — PREDNISONE 20 MG PO TABS: ORAL_TABLET | ORAL | Status: DC

## 2015-10-20 NOTE — Patient Instructions (Signed)
Labs before you leave  Trial prednisone for 2 weeks, if rash still there in 3 weeks refer to dermatology  Doxycycline for 3 more days just to cover more completely for LYme but only do prolonged 21 total day course if lyme test positive

## 2015-10-20 NOTE — Progress Notes (Signed)
Pre visit review using our clinic review tool, if applicable. No additional management support is needed unless otherwise documented below in the visit note. 

## 2015-10-20 NOTE — Progress Notes (Signed)
Subjective:  Jordan Cantrell is a 55 y.o. year old very pleasant male patient who presents for/with See problem oriented charting ROS- see any ROS included in HPI as well.   Past Medical History-  Patient Active Problem List   Diagnosis Date Noted  . Tobacco abuse 11/02/2011    Priority: High  . Essential hypertension 07/22/2014    Priority: Medium  . Insomnia 11/12/2012    Priority: Medium  . Hypertension 11/12/2012    Priority: Medium  . Migraine 09/30/2007    Priority: Medium  . Osteoarthritis 07/22/2014    Priority: Low  . History of colonic polyps 07/22/2014    Priority: Low  . Urinary frequency 09/11/2011    Priority: Low  . LOW BACK PAIN SYNDROME 09/24/2009    Priority: Low  . Dry eyes 09/30/2007    Priority: Low  . Allergic rhinitis 09/30/2007    Priority: Low  . History of shingles 09/30/2007    Priority: Low  . Hemorrhoid 10/30/2014    Medications- reviewed and updated Current Outpatient Prescriptions  Medication Sig Dispense Refill  . Ascorbic Acid (VITAMIN C PO) Take by mouth.    Marland Kitchen aspirin 81 MG tablet Take 81 mg by mouth daily.    . cetirizine (ZYRTEC) 10 MG tablet Take 1 tablet (10 mg total) by mouth daily. 30 tablet 11  . EPINEPHrine 0.3 mg/0.3 mL IJ SOAJ injection Inject 0.3 mLs (0.3 mg total) into the muscle once. As needed for worsening tongue swelling, shortness of breath. Call 911 if need to use. 1 Device 0  . meloxicam (MOBIC) 15 MG tablet Take 15 mg by mouth daily.  2  . metoprolol succinate (TOPROL-XL) 50 MG 24 hr tablet TAKE 2 TABLETS BY MOUTH EVERY DAY 60 tablet 3  . Multiple Vitamin (MULTI VITAMIN PO) Take by mouth.    . pramoxine-hydrocortisone (ANALPRAM-HC) 1-1 % rectal cream Place 1 application rectally 2 (two) times daily. 30 g 0  . SUMAtriptan (IMITREX) 50 MG tablet TAKE 1 TABLET BY MOUTH AS NEEDED MIGRAINE 6 tablet 0  . doxycycline (VIBRA-TABS) 100 MG tablet Take 1 tablet (100 mg total) by mouth 2 (two) times daily. 6 tablet 0  .  predniSONE (DELTASONE) 20 MG tablet Take 2 pills for 3 days, 1 pill for 3 days, 1/2 pill for 3 days, 1/2 pill every other day until finished 12 tablet 0   No current facility-administered medications for this visit.    Objective: BP 144/88 mmHg  Pulse 71  Temp(Src) 98.1 F (36.7 C) (Oral)  Ht 5\' 7"  (1.702 m)  Wt 179 lb (81.194 kg)  BMI 28.03 kg/m2  SpO2 97% Gen: NAD, resting comfortably CV: RRR no murmurs rubs or gallops Lungs: CTAB no crackles, wheeze, rhonchi Ext: no edema Skin: warm, dry, rash as noted below Rash: 5 cm erythematous rash with fine scale on upper abdomen at spot of prior tick bite.  R knee, r upper thigh, left axillary x 2- 4 spots- papules with excoriation Neuro: grossly normal, moves all extremities  Assessment/Plan:  Tick bite - Plan: B. burgdorfi Antibody Rash and nonspecific skin eruption S: Patient was seen for viral pharyngitis on 09/24/15 then later developed other typical URI symptoms without fever. Had multiple tick bites recently. On 17th I saw patient for tongue swelling after eating a hamburger. Alpha gal test was positive.   Patient later contacted me about a rash around a prior tick bite- we agreed to 7 days of doxycycline but he reached out about prolonged  course. Scheduled visit today to discuss. He shows me spot on abdomen that showed up after visit with me about 5 cm in diameter. Not typical erythema migrans rash. States was improving on prednisone for angioedema btu when stopped worsened. He also got several pruritic papules x4 as listed on exam that occurred after stopping  ROS- no fever, chills, nausea, vomiting. not ill appearing,No new medications other than doxycycline. Not immunocompromised. No mucus membrane involvement.   A/P: He has already been adequately treated if RMSF- 3 days past afebrile but was never febrile. We will cover for lyme though have very low suspicion. Last tick bite over month ago so would suspect seroconversion. Treat  10 days total but agreed to patient request for 21 if positive initial Lyme test- though would get western blot test. Will treat rash with prednisone for 2 week taper as well as for pruritic papules- if does not resolve or persists, refer to dermatology. I do not think rash represents erythema migrans as does not have typical appearance and he states no tick was ever on body more than 2 hours.   Known Hypertension and BP slightly high today but states only slept 4 hours and had coffee right before visit- will trend.   Return precautions advised.   Orders Placed This Encounter  Procedures  . B. burgdorfi Antibody    Meds ordered this encounter  Medications  . doxycycline (VIBRA-TABS) 100 MG tablet    Sig: Take 1 tablet (100 mg total) by mouth 2 (two) times daily.    Dispense:  6 tablet    Refill:  0  . predniSONE (DELTASONE) 20 MG tablet    Sig: Take 2 pills for 3 days, 1 pill for 3 days, 1/2 pill for 3 days, 1/2 pill every other day until finished    Dispense:  12 tablet    Refill:  0   The duration of face-to-face time during this visit was 25 minutes. Greater than 50% of this time was spent in counseling, explanation of diagnosis, planning of further management, and/or coordination of care.     [name], MD

## 2015-10-21 LAB — LYME AB/WESTERN BLOT REFLEX: B burgdorferi Ab IgG+IgM: <0.9 Index (ref <0.90)

## 2015-10-25 ENCOUNTER — Encounter: Payer: Self-pay | Admitting: Gastroenterology

## 2015-12-03 ENCOUNTER — Ambulatory Visit (AMBULATORY_SURGERY_CENTER): Payer: Self-pay | Admitting: *Deleted

## 2015-12-03 ENCOUNTER — Encounter: Payer: Self-pay | Admitting: Gastroenterology

## 2015-12-03 VITALS — Ht 66.0 in | Wt 180.0 lb

## 2015-12-03 DIAGNOSIS — Z86010 Personal history of colonic polyps: Secondary | ICD-10-CM

## 2015-12-03 MED ORDER — SUPREP BOWEL PREP KIT 17.5-3.13-1.6 GM/177ML PO SOLN: 1.0000 | Freq: Once | ORAL | Status: DC

## 2015-12-03 NOTE — Progress Notes (Signed)
Patient denies any allergies to egg or soy products. Patient denies complications with anesthesia/sedation.  Patient denies oxygen use at home and denies diet medications. Emmi instructions for colonoscopy explained but patient denied.  Pamphlet given.  

## 2015-12-11 ENCOUNTER — Other Ambulatory Visit: Payer: Self-pay | Admitting: Family Medicine

## 2015-12-17 ENCOUNTER — Ambulatory Visit (AMBULATORY_SURGERY_CENTER): Payer: Commercial Managed Care - HMO | Admitting: Gastroenterology

## 2015-12-17 ENCOUNTER — Encounter: Payer: Self-pay | Admitting: Gastroenterology

## 2015-12-17 VITALS — BP 114/87 | HR 67 | Temp 98.4°F | Resp 18 | Ht 66.0 in | Wt 180.0 lb

## 2015-12-17 DIAGNOSIS — K635 Polyp of colon: Secondary | ICD-10-CM

## 2015-12-17 DIAGNOSIS — D125 Benign neoplasm of sigmoid colon: Secondary | ICD-10-CM | POA: Diagnosis not present

## 2015-12-17 DIAGNOSIS — Z86010 Personal history of colonic polyps: Secondary | ICD-10-CM | POA: Diagnosis present

## 2015-12-17 MED ORDER — SODIUM CHLORIDE 0.9 % IV SOLN: 500.0000 mL | INTRAVENOUS | Status: DC

## 2015-12-17 NOTE — Op Note (Signed)
[facility] Patient Name: Jordan Cantrell Procedure Date: 12/17/2015 7:58 AM MRN: [id number] Endoscopist: Ladene Artist , MD Age: 55 Referring MD:  Date of Birth: [birth year] Gender: Male Account #: [id number] Procedure:                Colonoscopy Indications:              Surveillance: Personal history of adenomatous                            polyps on last colonoscopy > 3 years ago Medicines:                Monitored Anesthesia Care Procedure:                Pre-Anesthesia Assessment:                           - Prior to the procedure, a History and Physical                            was performed, and patient medications and                            allergies were reviewed. The patient's tolerance of                            previous anesthesia was also reviewed. The risks                            and benefits of the procedure and the sedation                            options and risks were discussed with the patient.                            All questions were answered, and informed consent                            was obtained. Prior Anticoagulants: The patient has                            taken no previous anticoagulant or antiplatelet                            agents. ASA Grade Assessment: II - A patient with                            mild systemic disease. After reviewing the risks                            and benefits, the patient was deemed in                            satisfactory condition to undergo the procedure.  After obtaining informed consent, the colonoscope                            was passed under direct vision. Throughout the                            procedure, the patient's blood pressure, pulse, and                            oxygen saturations were monitored continuously. The                            Model PCF-[id number]) scope was introduced                            through the anus and  advanced to the the cecum,                            identified by appendiceal orifice and ileocecal                            valve. The ileocecal valve, appendiceal orifice,                            and rectum were photographed. The quality of the                            bowel preparation was excellent. The colonoscopy                            was performed without difficulty. The patient                            tolerated the procedure well. Scope In: 8:10:40 AM Scope Out: 8:24:57 AM Scope Withdrawal Time: 0 hours 12 minutes 38 seconds  Total Procedure Duration: 0 hours 14 minutes 17 seconds  Findings:                 A 6 mm polyp was found in the sigmoid colon. The                            polyp was sessile. The polyp was removed with a                            cold snare. Resection and retrieval were complete.                           The exam was otherwise without abnormality on                            direct and retroflexion views. Complications:            No immediate complications. Estimated blood loss:  None. Estimated Blood Loss:     Estimated blood loss: none. Impression:               - One 6 mm polyp in the sigmoid colon, removed with                            a cold snare. Resected and retrieved.                           - The examination was otherwise normal on direct                            and retroflexion views. Recommendation:           - Repeat colonoscopy in 5 years for surveillance.                           - Patient has a contact number available for                            emergencies. The signs and symptoms of potential                            delayed complications were discussed with the                            patient. Return to normal activities tomorrow.                            Written discharge instructions were provided to the                            patient.                           -  Resume previous diet.                           - Continue present medications.                           - Await pathology results. Ladene Artist, MD 12/17/2015 8:27:19 AM This report has been signed electronically.

## 2015-12-17 NOTE — Patient Instructions (Signed)
YOU HAD AN ENDOSCOPIC PROCEDURE TODAY AT [facility]:   Refer to the procedure report that was given to you for any specific questions about what was found during the examination.  If the procedure report does not answer your questions, please call your gastroenterologist to clarify.  If you requested that your care partner not be given the details of your procedure findings, then the procedure report has been included in a sealed envelope for you to review at your convenience later.  YOU SHOULD EXPECT: Some feelings of bloating in the abdomen. Passage of more gas than usual.  Walking can help get rid of the air that was put into your GI tract during the procedure and reduce the bloating. If you had a lower endoscopy (such as a colonoscopy or flexible sigmoidoscopy) you may notice spotting of blood in your stool or on the toilet paper. If you underwent a bowel prep for your procedure, you may not have a normal bowel movement for a few days.  Please Note:  You might notice some irritation and congestion in your nose or some drainage.  This is from the oxygen used during your procedure.  There is no need for concern and it should clear up in a day or so.  SYMPTOMS TO REPORT IMMEDIATELY:   Following lower endoscopy (colonoscopy or flexible sigmoidoscopy):  Excessive amounts of blood in the stool  Significant tenderness or worsening of abdominal pains  Swelling of the abdomen that is new, acute  Fever of 100F or higher   For urgent or emergent issues, a gastroenterologist can be reached at any hour by calling [phone].   DIET: Your first meal following the procedure should be a small meal and then it is ok to progress to your normal diet. Heavy or fried foods are harder to digest and may make you feel nauseous or bloated.  Likewise, meals heavy in dairy and vegetables can increase bloating.  Drink plenty of fluids but you should avoid alcoholic beverages for 24  hours.  ACTIVITY:  You should plan to take it easy for the rest of today and you should NOT DRIVE or use heavy machinery until tomorrow (because of the sedation medicines used during the test).    FOLLOW UP: Our staff will call the number listed on your records the next business day following your procedure to check on you and address any questions or concerns that you may have regarding the information given to you following your procedure. If we do not reach you, we will leave a message.  However, if you are feeling well and you are not experiencing any problems, there is no need to return our call.  We will assume that you have returned to your regular daily activities without incident.  If any biopsies were taken you will be contacted by phone or by letter within the next 1-3 weeks.  Please call us at [phone] if you have not heard about the biopsies in 3 weeks.    SIGNATURES/CONFIDENTIALITY: You and/or your care partner have signed paperwork which will be entered into your electronic medical record.  These signatures attest to the fact that that the information above on your After Visit Summary has been reviewed and is understood.  Full responsibility of the confidentiality of this discharge information lies with you and/or your care-partner.  Handout given for Polyps.

## 2015-12-17 NOTE — Progress Notes (Signed)
Called to room to assist during endoscopic procedure.  Patient ID and intended procedure confirmed with present staff. Received instructions for my participation in the procedure from the performing physician.  

## 2015-12-17 NOTE — Progress Notes (Signed)
  [facility] Post-op Note  Patient: Jordan Cantrell  Procedure(s) Performed: colonoscopy  Patient Location: [facility] - Recovery Area  Anesthesia Type: Deep Sedation/Propofol  Level of Consciousness: awake, oriented and patient cooperative  Airway and Oxygen Therapy: Patient Spontanous Breathing  Post-op Pain: none  Post-op Assessment:  Post-op Vital signs reviewed, Patient's Cardiovascular Status Stable, Respiratory Function Stable, Patent Airway, No signs of Nausea or vomiting and Pain level controlled  Post-op Vital Signs: Reviewed and stable  Complications: No apparent anesthesia complications  Gudrun Axe E 8:28 AM

## 2015-12-20 ENCOUNTER — Telehealth: Payer: Self-pay | Admitting: *Deleted

## 2015-12-20 NOTE — Telephone Encounter (Signed)
Message left

## 2015-12-21 ENCOUNTER — Other Ambulatory Visit: Payer: Self-pay | Admitting: Family Medicine

## 2015-12-24 ENCOUNTER — Telehealth: Payer: Self-pay | Admitting: *Deleted

## 2015-12-24 NOTE — Telephone Encounter (Signed)
[facility] faxed a refill request for Temazepam 15mg -take one capsule by mouth at bedtime as needed.  I did not see this on the pts medication list.

## 2015-12-27 ENCOUNTER — Telehealth: Payer: Self-pay | Admitting: Gastroenterology

## 2015-12-27 NOTE — Telephone Encounter (Signed)
Patient notified that Dr. Fuller Plan will review pathology and mail out a letter with the results and correct recall date.  He will call back for any additional questions or concerns.

## 2015-12-28 ENCOUNTER — Other Ambulatory Visit: Payer: Self-pay

## 2015-12-28 MED ORDER — TEMAZEPAM 15 MG PO CAPS: 15.0000 mg | ORAL_CAPSULE | Freq: Every evening | ORAL | 3 refills | Status: DC | PRN

## 2015-12-28 NOTE — Telephone Encounter (Signed)
In that case- I dont mind writing when refill needed

## 2015-12-28 NOTE — Telephone Encounter (Signed)
Prescription faxed to the [facility]. Called patient to let him know

## 2015-12-28 NOTE — Telephone Encounter (Signed)
Yes thanks, has been on in past under Dr. Linda Hedges. I am ok with #30 with 3 refills qhs prn. If needs more than that within 6 months- advise him follow up visit

## 2015-12-28 NOTE — Telephone Encounter (Signed)
Pt states he just started back on 3rd shift. That's why he is asking for the sleep aid.

## 2015-12-30 ENCOUNTER — Encounter: Payer: Self-pay | Admitting: Gastroenterology

## 2016-02-01 ENCOUNTER — Encounter: Payer: Self-pay | Admitting: Family Medicine

## 2016-02-17 ENCOUNTER — Other Ambulatory Visit: Payer: Self-pay | Admitting: Family Medicine

## 2016-02-17 ENCOUNTER — Ambulatory Visit (INDEPENDENT_AMBULATORY_CARE_PROVIDER_SITE_OTHER): Payer: Commercial Managed Care - HMO | Admitting: Family Medicine

## 2016-02-17 ENCOUNTER — Encounter: Payer: Self-pay | Admitting: Family Medicine

## 2016-02-17 VITALS — BP 144/88 | HR 70 | Temp 98.0°F | Wt 178.2 lb

## 2016-02-17 DIAGNOSIS — R079 Chest pain, unspecified: Secondary | ICD-10-CM

## 2016-02-17 DIAGNOSIS — Z91014 Allergy to mammalian meats: Secondary | ICD-10-CM

## 2016-02-17 DIAGNOSIS — T783XXA Angioneurotic edema, initial encounter: Secondary | ICD-10-CM | POA: Diagnosis not present

## 2016-02-17 DIAGNOSIS — I10 Essential (primary) hypertension: Secondary | ICD-10-CM

## 2016-02-17 DIAGNOSIS — Z91018 Allergy to other foods: Secondary | ICD-10-CM | POA: Insufficient documentation

## 2016-02-17 DIAGNOSIS — Z23 Encounter for immunization: Secondary | ICD-10-CM

## 2016-02-17 DIAGNOSIS — Z7289 Other problems related to lifestyle: Secondary | ICD-10-CM | POA: Diagnosis not present

## 2016-02-17 LAB — CBC
HEMATOCRIT: 47.9 % (ref 39.0–52.0)
HEMOGLOBIN: 16.6 g/dL (ref 13.0–17.0)
MCHC: 34.8 g/dL (ref 30.0–36.0)
MCV: 91.3 fl (ref 78.0–100.0)
Platelets: 139 10*3/uL — ABNORMAL LOW (ref 150.0–400.0)
RBC: 5.24 Mil/uL (ref 4.22–5.81)
RDW: 12.2 % (ref 11.5–15.5)
WBC: 5.9 10*3/uL (ref 4.0–10.5)

## 2016-02-17 LAB — COMPREHENSIVE METABOLIC PANEL
ALK PHOS: 54 U/L (ref 39–117)
ALT: 48 U/L (ref 0–53)
AST: 28 U/L (ref 0–37)
Albumin: 4.5 g/dL (ref 3.5–5.2)
BUN: 10 mg/dL (ref 6–23)
CHLORIDE: 105 meq/L (ref 96–112)
CO2: 30 meq/L (ref 19–32)
Calcium: 9.3 mg/dL (ref 8.4–10.5)
Creatinine, Ser: 0.95 mg/dL (ref 0.40–1.50)
GFR: 87.05 mL/min (ref >60.00)
GLUCOSE: 94 mg/dL (ref 70–99)
POTASSIUM: 3.9 meq/L (ref 3.5–5.1)
SODIUM: 142 meq/L (ref 135–145)
Total Bilirubin: 0.7 mg/dL (ref 0.2–1.2)
Total Protein: 6.9 g/dL (ref 6.0–8.3)

## 2016-02-17 MED ORDER — AMLODIPINE BESYLATE 5 MG PO TABS: 5.0000 mg | ORAL_TABLET | Freq: Every day | ORAL | 3 refills | Status: DC

## 2016-02-17 NOTE — Progress Notes (Signed)
Subjective:  Jordan Cantrell is a 55 y.o. year old very pleasant male patient who presents for/with See problem oriented charting ROS- No dizziness, nausea, shortness of breath. , palpitationsNo headache or blurry vision .see any ROS included in HPI as well.   Past Medical History-  Patient Active Problem List   Diagnosis Date Noted  . Tobacco abuse 11/02/2011    Priority: High  . Essential hypertension 07/22/2014    Priority: Medium  . Insomnia 11/12/2012    Priority: Medium  . Hypertension 11/12/2012    Priority: Medium  . Migraine 09/30/2007    Priority: Medium  . Osteoarthritis 07/22/2014    Priority: Low  . History of colonic polyps 07/22/2014    Priority: Low  . Urinary frequency 09/11/2011    Priority: Low  . LOW BACK PAIN SYNDROME 09/24/2009    Priority: Low  . Dry eyes 09/30/2007    Priority: Low  . Allergic rhinitis 09/30/2007    Priority: Low  . History of shingles 09/30/2007    Priority: Low  . Allergy to beef 02/17/2016  . Hemorrhoid 10/30/2014    Medications- reviewed and updated Current Outpatient Prescriptions  Medication Sig Dispense Refill  . aspirin 81 MG tablet Take 81 mg by mouth daily.    Marland Kitchen EPINEPHrine 0.3 mg/0.3 mL IJ SOAJ injection Inject 0.3 mLs (0.3 mg total) into the muscle once. As needed for worsening tongue swelling, shortness of breath. Call 911 if need to use. 1 Device 0  . meloxicam (MOBIC) 15 MG tablet Take 15 mg by mouth daily.  2  . metoprolol succinate (TOPROL-XL) 50 MG 24 hr tablet TAKE 2 TABLETS BY MOUTH EVERY DAY 60 tablet 3  . Multiple Vitamin (MULTI VITAMIN PO) Take by mouth.    Marland Kitchen OVER THE COUNTER MEDICATION Take 1,000 mg by mouth daily. Tumeric    . pramoxine-hydrocortisone (ANALPRAM-HC) 1-1 % rectal cream Place 1 application rectally 2 (two) times daily. 30 g 0  . SUMAtriptan (IMITREX) 50 MG tablet TAKE 1 TABLET BY MOUTH AS NEEDED MIGRAINE 6 tablet 0  . temazepam (RESTORIL) 15 MG capsule Take 1 capsule (15 mg total) by mouth  at bedtime as needed for sleep. 30 capsule 3  . amLODipine (NORVASC) 5 MG tablet Take 1 tablet (5 mg total) by mouth daily. 90 tablet 3   Current Facility-Administered Medications  Medication Dose Route Frequency Provider Last Rate Last Dose  . 0.9 %  sodium chloride infusion  500 mL Intravenous Continuous Ladene Artist, MD        Objective: BP (!) 144/88   Pulse 70   Temp 98 F (36.7 C) (Oral)   Wt 178 lb 3.2 oz (80.8 kg)   SpO2 95%   BMI 28.76 kg/m  Gen: NAD, resting comfortably CV: RRR no murmurs rubs or gallops Lungs: CTAB no crackles, wheeze, rhonchi Ext: no edema Pain in right shoulder and chest when arm gets to about 135 degrees forward flexion and abduction. Around 90 degrees in both directions gets some stiffness- able to push past this somewhat but not full ROM as compared to left.  Skin: warm, dry Neuro: grossly normal, moves all extremities  EKG reassuring: rate 62, sinus rhythm, normal axis, normal intervals,no hypertrophy, no st or t wave changes  Assessment/Plan:  Chest pain, unspecified type - Plan: EKG 12-Lead S: for several weeks has had pain in right shoulder/trap that radiates into chest. Usually only ever occurs when lifting arm overhead and has restriction of range of motion  in right shoulder. Mild to moderate aching sensation A/P: We primarily did EKG to give patient some peace of mind to allow him to follow up with orthopedics (already established). He has nonexertional pain in right chest that starts in right shoulder and worse with arm movement and lifting overhead. Very low likelihood this is cardiac though we discussed EKG does not rule this out completely   Hypertension S: poorly controlled on metoprolol 1000mg  XL. BP Readings from Last 3 Encounters:  02/17/16 (!) 170/96--> 158/98--> 144/88 before patient left  12/17/15 114/87  10/20/15 (!) 144/88  A/P:Continue current meds:  But add amlodipine 5mg . Follow up next week  Allergy to beef S: Saw  Dr. Donneta Romberg- states there really was not much discussion of meat allergy/alpha gal. Apparently patient has read that allergy may change over time- I would still avoid meats even if lab test changes due to angioedema previously A/P: patient requests repea tpanel and will be done today   1 week HTN follow up  Orders Placed This Encounter  Procedures  . Flu Vaccine QUAD 36+ mos IM  . CBC    [facility]  . Comprehensive metabolic panel    [place] [facility]  . Hepatitis C antibody, reflex    [facility]  . Alpha-Gal Panel  . EKG 12-Lead    Order Specific Question:   Where should this test be performed    Answer:   Other    Meds ordered this encounter  Medications  . amLODipine (NORVASC) 5 MG tablet    Sig: Take 1 tablet (5 mg total) by mouth daily.    Dispense:  90 tablet    Refill:  3    Return precautions advised.  [name], MD

## 2016-02-17 NOTE — Progress Notes (Signed)
Pre visit review using our clinic review tool, if applicable. No additional management support is needed unless otherwise documented below in the visit note. 

## 2016-02-17 NOTE — Patient Instructions (Addendum)
Ekg looks great and considering pain is with shoulder movement- would follow up with orthopedics  Blood pressure too high. Start amlodipine 5mg . If expensive call me as it usually is very affordable and we will try hydrochlorothiazide diuretic instead  Schedule a follow up visit next Thursday if works for your schedule so we can recheck blood pressure  Labs before you leave including hepatitis C testing

## 2016-02-17 NOTE — Assessment & Plan Note (Signed)
S: Saw Dr. Donneta Romberg- states there really was not much discussion of meat allergy/alpha gal. Apparently patient has read that allergy may change over time- I would still avoid meats even if lab test changes due to angioedema previously A/P: patient requests repea tpanel and will be done today

## 2016-02-17 NOTE — Assessment & Plan Note (Signed)
S: poorly controlled on metoprolol 1000mg  XL. BP Readings from Last 3 Encounters:  02/17/16 (!) 170/96--> 158/98--> 144/88 before patient left  12/17/15 114/87  10/20/15 (!) 144/88  A/P:Continue current meds:  But add amlodipine 5mg . Follow up next week

## 2016-02-18 LAB — HEPATITIS C ANTIBODY: HCV AB: NEGATIVE

## 2016-02-20 LAB — ALPHA-GAL PANEL
Beef IgE: 0.67 kU/L — ABNORMAL HIGH (ref <0.35)
CLASS: 1
GALACTOSE-ALPHA-1,3-GALACTOSE IGE: 17 kU/L — AB (ref <0.35)
Lamb/Mutton IgE: 0.22 kU/L (ref <0.35)
PORK IGE: 0.26 kU/L (ref <0.35)

## 2016-02-25 ENCOUNTER — Encounter: Payer: Self-pay | Admitting: Family Medicine

## 2016-02-25 ENCOUNTER — Ambulatory Visit (INDEPENDENT_AMBULATORY_CARE_PROVIDER_SITE_OTHER): Payer: Commercial Managed Care - HMO | Admitting: Family Medicine

## 2016-02-25 DIAGNOSIS — I10 Essential (primary) hypertension: Secondary | ICD-10-CM

## 2016-02-25 MED ORDER — AMLODIPINE BESYLATE 10 MG PO TABS: 10.0000 mg | ORAL_TABLET | Freq: Every day | ORAL | 3 refills | Status: DC

## 2016-02-25 NOTE — Progress Notes (Signed)
Subjective:  Jordan Cantrell is a 55 y.o. year old very pleasant male patient who presents for/with See problem oriented charting ROS- No chest pain or shortness of breath. No headache or blurry vision.still avoiding meati.see any ROS included in HPI as well.   Past Medical History-  Patient Active Problem List   Diagnosis Date Noted  . Allergy to beef 02/17/2016    Priority: High  . Tobacco abuse 11/02/2011    Priority: High  . Insomnia 11/12/2012    Priority: Medium  . Hypertension 11/12/2012    Priority: Medium  . Migraine 09/30/2007    Priority: Medium  . Osteoarthritis 07/22/2014    Priority: Low  . History of colonic polyps 07/22/2014    Priority: Low  . Urinary frequency 09/11/2011    Priority: Low  . LOW BACK PAIN SYNDROME 09/24/2009    Priority: Low  . Dry eyes 09/30/2007    Priority: Low  . Allergic rhinitis 09/30/2007    Priority: Low  . History of shingles 09/30/2007    Priority: Low  . Hemorrhoid 10/30/2014    Medications- reviewed and updated Current Outpatient Prescriptions  Medication Sig Dispense Refill  . amLODipine (NORVASC) 10 MG tablet Take 1 tablet (10 mg total) by mouth daily. 90 tablet 3  . aspirin 81 MG tablet Take 81 mg by mouth daily.    Marland Kitchen EPINEPHrine 0.3 mg/0.3 mL IJ SOAJ injection Inject 0.3 mLs (0.3 mg total) into the muscle once. As needed for worsening tongue swelling, shortness of breath. Call 911 if need to use. 1 Device 0  . meloxicam (MOBIC) 15 MG tablet Take 15 mg by mouth daily.  2  . metoprolol succinate (TOPROL-XL) 50 MG 24 hr tablet TAKE 2 TABLETS BY MOUTH EVERY DAY 60 tablet 3  . Multiple Vitamin (MULTI VITAMIN PO) Take by mouth.    Marland Kitchen OVER THE COUNTER MEDICATION Take 1,000 mg by mouth daily. Tumeric    . pramoxine-hydrocortisone (ANALPRAM-HC) 1-1 % rectal cream Place 1 application rectally 2 (two) times daily. 30 g 0  . SUMAtriptan (IMITREX) 50 MG tablet TAKE 1 TABLET BY MOUTH AS NEEDED MIGRAINE 6 tablet 0  . temazepam  (RESTORIL) 15 MG capsule Take 1 capsule (15 mg total) by mouth at bedtime as needed for sleep. 30 capsule 3   Current Facility-Administered Medications  Medication Dose Route Frequency Provider Last Rate Last Dose  . 0.9 %  sodium chloride infusion  500 mL Intravenous Continuous Ladene Artist, MD        Objective: BP 140/86   Pulse 65   Temp 98.1 F (36.7 C) (Oral)   Wt 177 lb 12.8 oz (80.6 kg)   SpO2 97%   BMI 28.70 kg/m  Gen: NAD, resting comfortably CV: RRR no murmurs rubs or gallops Lungs: CTAB no crackles, wheeze, rhonchi Ext: no edema   Assessment/Plan:  Hypertension S: controlled in past on just metoprolol 100mg  XL. Added amlodipine 5mg  last week. Home #s still in 150s BP Readings from Last 3 Encounters:  02/25/16 140/86- small cuff. Large cuff was 132/76.   02/17/16 (!) 144/88  12/17/15 114/87  A/P:Continue current meds:  But we will increase amlodipine to 10mg . 3 week mychart update- if still high likely come back with his home cuff at that time as home #s seem to run higher than in offce so want to confirm cuff   Meds ordered this encounter  Medications  . amLODipine (NORVASC) 10 MG tablet    Sig: Take 1 tablet (  10 mg total) by mouth daily.    Dispense:  90 tablet    Refill:  3    Return precautions advised.  [name], MD

## 2016-02-25 NOTE — Progress Notes (Signed)
Pre visit review using our clinic review tool, if applicable. No additional management support is needed unless otherwise documented below in the visit note. 

## 2016-02-25 NOTE — Patient Instructions (Signed)
Stay on metoprolol. Double up the 5mg  amlodipine until you run out and then go pick up the 10mg . Update me on your home BP #s in about 3 weeks.   Our goal is <140/90 so we may need additional medicine if not at goal but hopefully not- I think this may get you there

## 2016-02-26 NOTE — Assessment & Plan Note (Signed)
S: controlled in past on just metoprolol 100mg  XL. Added amlodipine 5mg  last week. Home #s still in 150s BP Readings from Last 3 Encounters:  02/25/16 140/86- small cuff. Large cuff was 132/76.   02/17/16 (!) 144/88  12/17/15 114/87  A/P:Continue current meds:  But we will increase amlodipine to 10mg . 3 week mychart update- if still high likely come back with his home cuff at that time as home #s seem to run higher than in offce so want to confirm cuff

## 2016-04-10 ENCOUNTER — Other Ambulatory Visit: Payer: Self-pay | Admitting: Family Medicine

## 2016-08-01 ENCOUNTER — Other Ambulatory Visit: Payer: Self-pay | Admitting: Family Medicine

## 2016-10-10 ENCOUNTER — Other Ambulatory Visit: Payer: Self-pay | Admitting: Family Medicine

## 2016-10-16 ENCOUNTER — Other Ambulatory Visit: Payer: Self-pay | Admitting: Family Medicine

## 2016-11-08 ENCOUNTER — Ambulatory Visit (INDEPENDENT_AMBULATORY_CARE_PROVIDER_SITE_OTHER): Payer: 59 | Admitting: Adult Health

## 2016-11-08 ENCOUNTER — Encounter: Payer: Self-pay | Admitting: Adult Health

## 2016-11-08 VITALS — BP 144/80 | Temp 98.4°F

## 2016-11-08 DIAGNOSIS — S30861A Insect bite (nonvenomous) of abdominal wall, initial encounter: Secondary | ICD-10-CM

## 2016-11-08 DIAGNOSIS — W57XXXD Bitten or stung by nonvenomous insect and other nonvenomous arthropods, subsequent encounter: Secondary | ICD-10-CM

## 2016-11-08 DIAGNOSIS — S1096XA Insect bite of unspecified part of neck, initial encounter: Secondary | ICD-10-CM | POA: Diagnosis not present

## 2016-11-08 DIAGNOSIS — Z7689 Persons encountering health services in other specified circumstances: Secondary | ICD-10-CM | POA: Diagnosis not present

## 2016-11-08 DIAGNOSIS — W57XXXA Bitten or stung by nonvenomous insect and other nonvenomous arthropods, initial encounter: Secondary | ICD-10-CM

## 2016-11-08 MED ORDER — DOXYCYCLINE HYCLATE 100 MG PO CAPS: 100.0000 mg | ORAL_CAPSULE | Freq: Two times a day (BID) | ORAL | 0 refills | Status: DC

## 2016-11-08 NOTE — Progress Notes (Addendum)
Subjective:    Patient ID: Jordan Cantrell, male    DOB: 05/19/1960, 56 y.o.   MRN: [id number]  HPI  56 year old male who  has a past medical history of Allergic rhinitis, cause unspecified; Arthritis; Balanoposthitis; Chest pain, unspecified; Constipation (09/24/2009); Dry eye syndrome; Hearing loss; HEMORRHOID, EXTERNAL, THROMBOSED (05/06/2010); History of shingles; Hypertension; Migraine, unspecified, without mention of intractable migraine without mention of status migrainosus; and TINNITUS NOS (01/31/2008).  He is a patient of Dr. Yong Channel who I am seeing today for exposure to tick. He reports noticing multiple tick bites on his neck and abdomen. His wife pulled off a tick from the back of his neck about one week ago. He reports itching and redness around tick bite.   Denies any fevers, full body rash, or worsening joint pain.   He did test positive for Alpha Gal back in May 2017 and October 2017. He would like to recheck today   Review of Systems See HPI   Past Medical History:  Diagnosis Date  . Allergic rhinitis, cause unspecified    to tick bite - resolved  . Arthritis    knees, hands  . Balanoposthitis    Mild  . Chest pain, unspecified    2007- stress test normal  . Constipation 09/24/2009   Prn metamucil  - occasional  . Dry eye syndrome   . Hearing loss    slight - bilateral - no hearing aids  . HEMORRHOID, EXTERNAL, THROMBOSED 05/06/2010   Qualifier: Diagnosis of  By: Ronnald Ramp MD, Arvid Right.   . History of shingles   . Hypertension   . Migraine, unspecified, without mention of intractable migraine without mention of status migrainosus   . TINNITUS NOS 01/31/2008   Qualifier: Diagnosis of  By: Plotnikov MD, Evie Lacks     Social History   Social History  . Marital status: Married    Spouse name: N/A  . Number of children: 2  . Years of education: 16   Occupational History  . Geophysicist/field seismologist Tobacco   Social History Main Topics  . Smoking status: Former  Smoker    Packs/day: 0.10    Types: Cigarettes    Quit date: 04/28/2014  . Smokeless tobacco: Current User    Types: Snuff  . Alcohol use 6.0 - 7.2 oz/week    10 - 12 Cans of beer per week     Comment: beer/liquor  . Drug use: No  . Sexual activity: Yes    Partners: Female   Other Topics Concern  . Not on file   Social History Narrative   Married-'81. 1 dtr - '87, 1 son - '83, 2 granddaughters   Marriage in good health.       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       Hobbies: time with grandkids, work with cows, minimal travel due to pain    Past Surgical History:  Procedure Laterality Date  . COLONOSCOPY  2013    hx polyps   . [place]   right-crushed all 4 toes reportedly  . KNEE ARTHROSCOPY  2011   Right for Condromalacia  . ORIF FEMUR FRACTURE  1993   Right  . WISDOM TOOTH EXTRACTION      Family History  Problem Relation Age of Onset  . Diabetes type II Mother   . Heart  disease Mother        CABG-died during this, smoker  . Hyperlipidemia Mother   . Hypertension Mother   . Diabetes Father   . Heart disease Father        thought death due to this, smoker  . Hypertension Father   . Schizophrenia Father   . Colon polyps Sister   . Colon cancer Neg Hx   . Stomach cancer Neg Hx   . Esophageal cancer Neg Hx   . Rectal cancer Neg Hx     Allergies  Allergen Reactions  . Codeine Nausea Only    Current Outpatient Prescriptions on File Prior to Visit  Medication Sig Dispense Refill  . amLODipine (NORVASC) 10 MG tablet Take 1 tablet (10 mg total) by mouth daily. 90 tablet 3  . aspirin 81 MG tablet Take 81 mg by mouth daily.    Marland Kitchen EPINEPHrine 0.3 mg/0.3 mL IJ SOAJ injection Inject 0.3 mLs (0.3 mg total) into the muscle once. As needed for worsening tongue swelling, shortness of breath. Call 911 if need to use. 1 Device 0  . meloxicam  (MOBIC) 15 MG tablet Take 7.5 mg by mouth daily.   2  . metoprolol succinate (TOPROL-XL) 50 MG 24 hr tablet TAKE 2 TABLETS BY MOUTH EVERY DAY 60 tablet 5  . Multiple Vitamin (MULTI VITAMIN PO) Take by mouth.    Marland Kitchen OVER THE COUNTER MEDICATION Take 1,000 mg by mouth daily. Tumeric    . pramoxine-hydrocortisone (ANALPRAM-HC) 1-1 % rectal cream Place 1 application rectally 2 (two) times daily. 30 g 0  . SUMAtriptan (IMITREX) 50 MG tablet TAKE 1 TABLET BY MOUTH AS NEEDED MIGRAINE 6 tablet 0  . temazepam (RESTORIL) 15 MG capsule TAKE 1 CAPSULE AT BEDTIME AS NEEDED FOR SLEEP 30 capsule 2  . triamcinolone cream (KENALOG) 0.5 % APPLY 1 APPLICATION TOPICALLY 2 (TWO) TIMES DAILY. FOR 10 DAYS TO AFFECTED AREA 30 g 0   Current Facility-Administered Medications on File Prior to Visit  Medication Dose Route Frequency Provider Last Rate Last Dose  . 0.9 %  sodium chloride infusion  500 mL Intravenous Continuous Ladene Artist, MD        BP (!) 144/80 (BP Location: Left Arm, Patient Position: Sitting, Cuff Size: Normal)   Temp 98.4 F (36.9 C) (Oral)       Objective:   Physical Exam  Constitutional: He is oriented to person, place, and time. He appears well-developed. No distress.  Cardiovascular: Normal rate, regular rhythm, normal heart sounds and intact distal pulses.   No murmur heard. Pulmonary/Chest: Effort normal and breath sounds normal. No respiratory distress. He has no wheezes. He exhibits no tenderness.  Musculoskeletal: Normal range of motion. He exhibits no edema, tenderness or deformity.  Neurological: He is alert and oriented to person, place, and time. He has normal reflexes. He displays normal reflexes. No cranial nerve deficit. He exhibits normal muscle tone. Coordination normal.  Skin: Skin is warm and dry. No rash noted. He is not diaphoretic. No erythema. No pallor.  Multiple possible tick bites on back of neck and abdomen. Localized redness but not bulls eye rash. No abscess  noted.   Psychiatric: He has a normal mood and affect. His behavior is normal. Judgment and thought content normal.  Vitals reviewed.     Assessment & Plan:   1. Tick bite of abdominal wall, initial encounter - Will treat with prophylactic measures. Advised DEET applications daily to closes and skin  - Alpha-Gal Panel -  doxycycline (VIBRAMYCIN) 100 MG capsule; Take 1 capsule (100 mg total) by mouth 2 (two) times daily.  Dispense: 14 capsule; Refill: 0 - B. burgdorfi Antibody   Dorothyann Peng, NP

## 2016-11-08 NOTE — Addendum Note (Signed)
Addended by: Tomi Likens on: 11/08/2016 11:06 AM   Modules accepted: Orders

## 2016-11-10 DIAGNOSIS — L812 Freckles: Secondary | ICD-10-CM | POA: Diagnosis not present

## 2016-11-10 DIAGNOSIS — D225 Melanocytic nevi of trunk: Secondary | ICD-10-CM | POA: Diagnosis not present

## 2016-11-10 DIAGNOSIS — L570 Actinic keratosis: Secondary | ICD-10-CM | POA: Diagnosis not present

## 2016-11-10 DIAGNOSIS — L821 Other seborrheic keratosis: Secondary | ICD-10-CM | POA: Diagnosis not present

## 2016-11-10 LAB — B. BURGDORFI ANTIBODIES

## 2016-11-13 LAB — ALPHA-GAL PANEL
BEEF IGE: 0.77 kU/L — AB (ref <0.35)
CLASS: 2
Class: 1
Galactose-alpha-1,3-galactose IgE*: 23.8 kU/L — ABNORMAL HIGH (ref <0.35)
Lamb/Mutton IgE: 0.47 kU/L — ABNORMAL HIGH (ref <0.35)
Pork IgE: 0.3 kU/L (ref <0.35)

## 2016-12-11 ENCOUNTER — Other Ambulatory Visit: Payer: Self-pay | Admitting: Internal Medicine

## 2016-12-11 ENCOUNTER — Encounter: Payer: Self-pay | Admitting: Family Medicine

## 2016-12-12 ENCOUNTER — Other Ambulatory Visit: Payer: Self-pay

## 2016-12-12 MED ORDER — SUMATRIPTAN SUCCINATE 50 MG PO TABS: ORAL_TABLET | ORAL | 0 refills | Status: DC

## 2016-12-20 ENCOUNTER — Encounter: Payer: 59 | Admitting: Family Medicine

## 2016-12-20 ENCOUNTER — Ambulatory Visit (INDEPENDENT_AMBULATORY_CARE_PROVIDER_SITE_OTHER): Payer: 59 | Admitting: Family Medicine

## 2016-12-20 ENCOUNTER — Encounter: Payer: Self-pay | Admitting: Family Medicine

## 2016-12-20 VITALS — BP 132/82 | HR 78 | Temp 98.2°F | Ht 66.0 in | Wt 176.6 lb

## 2016-12-20 DIAGNOSIS — G43109 Migraine with aura, not intractable, without status migrainosus: Secondary | ICD-10-CM

## 2016-12-20 DIAGNOSIS — I10 Essential (primary) hypertension: Secondary | ICD-10-CM

## 2016-12-20 NOTE — Assessment & Plan Note (Signed)
S: controlled on  metoprolol 100mg  XL, amlodipine 10mg . Home #s usually 130 over 80s.  BP Readings from Last 3 Encounters:  12/20/16 140/72  11/08/16 (!) 144/80  02/25/16 140/86  A/P: We discussed blood pressure goal of <140/90. Continue current meds

## 2016-12-20 NOTE — Progress Notes (Signed)
Subjective:  ANCIL DEWAN is a 56 y.o. year old very pleasant male patient who presents for/with See problem oriented charting ROS- reports headache with aura, no blurry vision, no arm or leg weaknss, no slurred speech   Past Medical History-  Patient Active Problem List   Diagnosis Date Noted  . Allergy to beef 02/17/2016    Priority: High  . Tobacco abuse 11/02/2011    Priority: High  . Insomnia 11/12/2012    Priority: Medium  . Hypertension 11/12/2012    Priority: Medium  . Migraine 09/30/2007    Priority: Medium  . Osteoarthritis 07/22/2014    Priority: Low  . History of colonic polyps 07/22/2014    Priority: Low  . Urinary frequency 09/11/2011    Priority: Low  . LOW BACK PAIN SYNDROME 09/24/2009    Priority: Low  . Dry eyes 09/30/2007    Priority: Low  . Allergic rhinitis 09/30/2007    Priority: Low  . History of shingles 09/30/2007    Priority: Low  . Hemorrhoid 10/30/2014    Medications- reviewed and updated Current Outpatient Prescriptions  Medication Sig Dispense Refill  . amLODipine (NORVASC) 10 MG tablet Take 1 tablet (10 mg total) by mouth daily. 90 tablet 3  . aspirin 81 MG tablet Take 81 mg by mouth daily.    Marland Kitchen EPINEPHrine 0.3 mg/0.3 mL IJ SOAJ injection Inject 0.3 mLs (0.3 mg total) into the muscle once. As needed for worsening tongue swelling, shortness of breath. Call 911 if need to use. 1 Device 0  . meloxicam (MOBIC) 15 MG tablet Take 7.5 mg by mouth daily.   2  . metoprolol succinate (TOPROL-XL) 50 MG 24 hr tablet TAKE 2 TABLETS BY MOUTH EVERY DAY 60 tablet 5  . Multiple Vitamin (MULTI VITAMIN PO) Take by mouth.    Marland Kitchen OVER THE COUNTER MEDICATION Take 1,000 mg by mouth daily. Tumeric    . pramoxine-hydrocortisone (ANALPRAM-HC) 1-1 % rectal cream Place 1 application rectally 2 (two) times daily. 30 g 0  . SUMAtriptan (IMITREX) 50 MG tablet TAKE 1 TABLET BY MOUTH AS NEEDED MIGRAINE 6 tablet 0  . temazepam (RESTORIL) 15 MG capsule TAKE 1 CAPSULE AT  BEDTIME AS NEEDED FOR SLEEP 30 capsule 2  . triamcinolone cream (KENALOG) 0.5 % APPLY 1 APPLICATION TOPICALLY 2 (TWO) TIMES DAILY. FOR 10 DAYS TO AFFECTED AREA 30 g 0   No current facility-administered medications for this visit.     Objective: BP 132/82   Pulse 78   Temp 98.2 F (36.8 C) (Oral)   Ht 5\' 6"  (1.676 m)   Wt 176 lb 9.6 oz (80.1 kg)   SpO2 97%   BMI 28.50 kg/m  Gen: NAD, resting comfortably CV: RRR no murmurs rubs or gallops Lungs: CTAB no crackles, wheeze, rhonchi Abdomen: soft/nontender/nondistended/normal bowel sounds. No rebound or guarding.  Ext: no edema Skin: warm, dry Neuro: CN II-XII intact, sensation and reflexes normal throughout, 5/5 muscle strength in bilateral upper and lower extremities. Normal finger to nose. Normal rapid alternating movements. No pronator drift. Normal romberg. Normal gait.   Assessment/Plan:  Hypertension S: controlled on  metoprolol 100mg  XL, amlodipine 10mg . Home #s usually 130 over 80s.  BP Readings from Last 3 Encounters:  12/20/16 140/72  11/08/16 (!) 144/80  02/25/16 140/86  A/P: We discussed blood pressure goal of <140/90. Continue current meds   Migraine S: had not had a migraine in several months. Still on metoprolol 100mg  XR to try to help with prevention. Took  old imitrex and didn't seem to help as much as usually but did eventually knock it off- had one in recent weeks. [place] with colorful spots before each one. Took and then had recurrence 4 out of 5 days including 2 in one day. - now has been over a week. Had some soreness down middle of his head for a few days after.  A/P: Has not had back to back days of headaches like this in recent memory but they have now resolved. May have been increased stress from poor sleep or hydration. He is also trying to cut out his caffeine. He can continue metoprolol but if does not "bounce back" to prior may need more prophylactic therapy. Reassuring neuro exam and long term migraines  so imaging not necessary at present  Return precautions advised.  [name], MD

## 2016-12-20 NOTE — Patient Instructions (Signed)
Blood pressure looks great  I agree with cutting caffeine out. Try to get a good nights rest, stay well hydrated, and exercise at least 150 minutes a week and hopefully migraines will calm back down. See [place] back for new or worsening symptoms

## 2016-12-20 NOTE — Assessment & Plan Note (Signed)
S: had not had a migraine in several months. Still on metoprolol 100mg  XR to try to help with prevention. Took old imitrex and didn't seem to help as much as usually but did eventually knock it off- had one in recent weeks. [place] with colorful spots before each one. Took and then had recurrence 4 out of 5 days including 2 in one day. - now has been over a week. Had some soreness down middle of his head for a few days after.  A/P: Has not had back to back days of headaches like this in recent memory but they have now resolved. May have been increased stress from poor sleep or hydration. He is also trying to cut out his caffeine. He can continue metoprolol but if does not "bounce back" to prior may need more prophylactic therapy. Reassuring neuro exam and long term migraines so imaging not necessary at present

## 2016-12-24 ENCOUNTER — Other Ambulatory Visit: Payer: Self-pay | Admitting: Family Medicine

## 2016-12-24 ENCOUNTER — Encounter: Payer: Self-pay | Admitting: Family Medicine

## 2016-12-29 DIAGNOSIS — G43109 Migraine with aura, not intractable, without status migrainosus: Secondary | ICD-10-CM | POA: Diagnosis not present

## 2017-03-21 ENCOUNTER — Ambulatory Visit (INDEPENDENT_AMBULATORY_CARE_PROVIDER_SITE_OTHER): Payer: 59

## 2017-03-21 DIAGNOSIS — Z23 Encounter for immunization: Secondary | ICD-10-CM | POA: Diagnosis not present

## 2017-04-11 ENCOUNTER — Other Ambulatory Visit: Payer: Self-pay

## 2017-04-11 MED ORDER — AMLODIPINE BESYLATE 10 MG PO TABS: 10.0000 mg | ORAL_TABLET | Freq: Every day | ORAL | 3 refills | Status: DC

## 2017-04-29 ENCOUNTER — Other Ambulatory Visit: Payer: Self-pay | Admitting: Family Medicine

## 2017-04-29 ENCOUNTER — Encounter: Payer: Self-pay | Admitting: Family Medicine

## 2017-04-30 ENCOUNTER — Other Ambulatory Visit: Payer: Self-pay

## 2017-04-30 MED ORDER — METOPROLOL SUCCINATE ER 50 MG PO TB24: 100.0000 mg | ORAL_TABLET | Freq: Every day | ORAL | 5 refills | Status: DC

## 2017-04-30 NOTE — Telephone Encounter (Signed)
Refill already processed.   metoprolol succinate (TOPROL-XL) 50 MG 24 hr tablet 60 tablet 5 04/30/2017    Sig - Route: Take 2 tablets (100 mg total) by mouth daily. Take with or immediately following a meal. - Oral   Sent to pharmacy as: metoprolol succinate (TOPROL-XL) 50 MG 24 hr tablet   E-Prescribing Status: Receipt confirmed by pharmacy (04/30/2017 10:22 AM EST)

## 2017-04-30 NOTE — Telephone Encounter (Signed)
Copied from [place] #[id number]. Topic: Quick Communication - Rx Refill/Question >> Apr 30, 2017  8:48 AM Yvette Rack wrote: Has the patient contacted their pharmacy? Yes.  Since last Tuesday   (Agent: If no, request that the patient contact the pharmacy for the refill.)   metoprolol succinate (TOPROL-XL) 50 MG 24 hr tablet   Preferred Pharmacy (with phone number or street name): [id number] - [place], [place] - [address] [place] [address] [place] [address] [phone] (Phone) [phone] (Fax)     Agent: Please be advised that RX refills may take up to 3 business days. We ask that you follow-up with your pharmacy.

## 2017-05-11 DIAGNOSIS — J209 Acute bronchitis, unspecified: Secondary | ICD-10-CM | POA: Diagnosis not present

## 2017-05-11 DIAGNOSIS — J019 Acute sinusitis, unspecified: Secondary | ICD-10-CM | POA: Diagnosis not present

## 2017-05-22 ENCOUNTER — Other Ambulatory Visit: Payer: Self-pay

## 2017-05-22 MED ORDER — SUMATRIPTAN SUCCINATE 50 MG PO TABS: ORAL_TABLET | ORAL | 1 refills | Status: DC

## 2017-05-31 ENCOUNTER — Other Ambulatory Visit: Payer: Self-pay

## 2017-05-31 MED ORDER — TEMAZEPAM 15 MG PO CAPS: 15.0000 mg | ORAL_CAPSULE | Freq: Every evening | ORAL | 2 refills | Status: DC | PRN

## 2017-07-25 ENCOUNTER — Other Ambulatory Visit: Payer: Self-pay | Admitting: Adult Health

## 2017-07-25 DIAGNOSIS — K640 First degree hemorrhoids: Secondary | ICD-10-CM

## 2017-07-26 MED ORDER — HYDROCORTISONE ACE-PRAMOXINE 1-1 % RE CREA: 1.0000 "application " | TOPICAL_CREAM | Freq: Two times a day (BID) | RECTAL | 0 refills | Status: DC

## 2017-08-21 ENCOUNTER — Other Ambulatory Visit: Payer: Self-pay | Admitting: Family Medicine

## 2017-10-08 ENCOUNTER — Other Ambulatory Visit: Payer: Self-pay | Admitting: Family Medicine

## 2017-10-08 ENCOUNTER — Encounter: Payer: Self-pay | Admitting: Family Medicine

## 2017-10-09 MED ORDER — TRIAMCINOLONE ACETONIDE 0.5 % EX CREA: 1.0000 "application " | TOPICAL_CREAM | Freq: Two times a day (BID) | CUTANEOUS | 0 refills | Status: DC

## 2017-10-15 ENCOUNTER — Other Ambulatory Visit: Payer: Self-pay | Admitting: Family Medicine

## 2017-10-15 ENCOUNTER — Encounter: Payer: Self-pay | Admitting: Family Medicine

## 2017-10-15 NOTE — Telephone Encounter (Signed)
Per my chart message-looks likeJamie already filled this?  Lets make sure to schedule him for a physical sometime in the next 2 months as well-will be a year past his last visit at that point

## 2017-10-15 NOTE — Telephone Encounter (Signed)
Please Advise

## 2017-10-18 NOTE — Telephone Encounter (Signed)
Patient scheduled.

## 2017-10-18 NOTE — Telephone Encounter (Signed)
Please call pt and schedule physical in the next 2 months per Dr. Yong Channel.

## 2017-10-19 ENCOUNTER — Encounter: Payer: Self-pay | Admitting: Family Medicine

## 2017-10-19 ENCOUNTER — Ambulatory Visit: Payer: 59 | Admitting: Family Medicine

## 2017-10-19 VITALS — BP 130/82 | HR 77 | Temp 98.2°F | Ht 67.0 in | Wt 184.0 lb

## 2017-10-19 DIAGNOSIS — S30860A Insect bite (nonvenomous) of lower back and pelvis, initial encounter: Secondary | ICD-10-CM

## 2017-10-19 DIAGNOSIS — M2550 Pain in unspecified joint: Secondary | ICD-10-CM | POA: Diagnosis not present

## 2017-10-19 DIAGNOSIS — W57XXXA Bitten or stung by nonvenomous insect and other nonvenomous arthropods, initial encounter: Secondary | ICD-10-CM | POA: Diagnosis not present

## 2017-10-19 MED ORDER — DOXYCYCLINE HYCLATE 100 MG PO TABS: 100.0000 mg | ORAL_TABLET | Freq: Two times a day (BID) | ORAL | 0 refills | Status: DC

## 2017-10-19 NOTE — Progress Notes (Signed)
Patient: Jordan Cantrell MRN: [id number] DOB: 09/07/1960 PCP: Marin Olp, MD     Subjective:  Chief Complaint  Patient presents with  . Tick Removal  . Temporomandibular Joint Pain  . Fatigue    HPI: The patient is a 57 y.o. male who presents today for possible lymes disease. 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 He has removed multiple ticks from his body, but states he doesn't think they are there for long. He gets really itchy when he has a tick bite.  He has at least 4 tick bites with 2 on his back that have areas of erythema, but no real cellulitis. No rashes around wrists/ankles and no bullseye lesions. He has had no fevers, but does have joint pain everywhere. He does have arthritis, but feels like this is new. No family history of RA and denies red/swollen joints.   Review of Systems  Constitutional: Positive for fatigue. Negative for fever.  HENT: Negative for sinus pain.   Gastrointestinal: Positive for vomiting. Negative for constipation, diarrhea and nausea.  Musculoskeletal: Positive for joint swelling.  Skin: Negative for rash.  Neurological: Positive for weakness. Negative for headaches.    Allergies Patient is allergic to codeine.  Past Medical History Patient  has a past medical history of Allergic rhinitis, cause unspecified, Arthritis, Balanoposthitis, Chest pain, unspecified, Constipation (09/24/2009), Dry eye syndrome, Hearing loss, HEMORRHOID, EXTERNAL, THROMBOSED (05/06/2010), History of shingles, Hypertension, Migraine, unspecified, without mention of intractable migraine without mention of status migrainosus, and TINNITUS NOS (01/31/2008).  Surgical History Patient  has a past surgical history that includes ORIF femur fracture (1993); Knee arthroscopy (2011); Foot fracture surgery (1993); Wisdom tooth extraction; and Colonoscopy (2013).  Family History Pateint's family history includes Colon polyps in his sister; Diabetes in his  father; Diabetes type II in his mother; Heart disease in his father and mother; Hyperlipidemia in his mother; Hypertension in his father and mother; Schizophrenia in his father.  Social History Patient  reports that he quit smoking about 3 years ago. His smoking use included cigarettes. He smoked 0.10 packs per day. His smokeless tobacco use includes snuff. He reports that he drinks about 6.0 - 7.2 oz of alcohol per week. He reports that he does not use drugs.    Objective: Vitals:   10/19/17 0920  BP: 130/82  Pulse: 77  Temp: 98.2 F (36.8 C)  SpO2: 96%  Weight: 184 lb (83.5 kg)  Height: 5\' 7"  (1.702 m)    Body mass index is 28.82 kg/m.  Physical Exam  Constitutional: He appears well-developed and well-nourished.  HENT:  Right Ear: External ear normal.  Left Ear: External ear normal.  Mouth/Throat: Oropharynx is clear and moist.  Cardiovascular: Normal rate, regular rhythm and normal heart sounds.  Pulmonary/Chest: Effort normal and breath sounds normal.  Abdominal: Soft. Bowel sounds are normal.  Musculoskeletal:  Bilateral hands with herbeden nodes on DIPs. No edema. Grip intact.   Skin:  2 places on back-one on right upper shoulder and one on left lower back that are slightly raised and erythematous with a scab. No bulls eye lesion.   Vitals reviewed.      Assessment/plan: 1. Tick bite, initial encounter Check for lymes/RMSF. He would like this as well. Since he is having arthralgia, would like to know for lymes since 28 day course of medication. Will put him on 10 day course to cover him and treat any possible cellulitis. Discussed likely hood of having this is  low since tick not on him for >72 hours. Will f/u with labs.  - B. burgdorfi antibodies by WB - [place] mtn spotted fvr abs pnl(IgG+IgM)  2. Arthralgia, unspecified joint Check for RA, but again discussed hx and exam more consistent with OA.  - Rheumatoid factor   Has f/u for annual in a few months with PCP.    Return if symptoms worsen or fail to improve.   Orma Flaming, MD [place]   10/19/2017

## 2017-10-20 LAB — RHEUMATOID FACTOR: Rhuematoid fact SerPl-aCnc: <14 IU/mL (ref <14)

## 2017-10-23 LAB — B. BURGDORFI ANTIBODIES BY WB
B BURGDORFERI IGG ABS (IB): NEGATIVE
B burgdorferi IgM Abs (IB): NEGATIVE
LYME DISEASE 23 KD IGG: NONREACTIVE
LYME DISEASE 28 KD IGG: NONREACTIVE
LYME DISEASE 30 KD IGG: NONREACTIVE
LYME DISEASE 39 KD IGG: NONREACTIVE
LYME DISEASE 66 KD IGG: NONREACTIVE
LYME DISEASE 93 KD IGG: NONREACTIVE
Lyme Disease 18 kD IgG: NONREACTIVE
Lyme Disease 23 kD IgM: NONREACTIVE
Lyme Disease 39 kD IgM: NONREACTIVE
Lyme Disease 41 kD IgG: REACTIVE — AB
Lyme Disease 41 kD IgM: NONREACTIVE
Lyme Disease 45 kD IgG: NONREACTIVE
Lyme Disease 58 kD IgG: NONREACTIVE

## 2017-10-23 LAB — ROCKY MTN SPOTTED FVR ABS PNL(IGG+IGM)
RMSF IgG: NOT DETECTED
RMSF IgM: NOT DETECTED

## 2017-11-22 ENCOUNTER — Other Ambulatory Visit: Payer: Self-pay | Admitting: Family Medicine

## 2017-11-26 DIAGNOSIS — L570 Actinic keratosis: Secondary | ICD-10-CM | POA: Diagnosis not present

## 2018-01-04 ENCOUNTER — Encounter: Payer: Self-pay | Admitting: Family Medicine

## 2018-01-04 ENCOUNTER — Ambulatory Visit (INDEPENDENT_AMBULATORY_CARE_PROVIDER_SITE_OTHER): Payer: 59 | Admitting: Family Medicine

## 2018-01-04 VITALS — BP 116/78 | HR 68 | Temp 98.4°F | Ht 66.0 in | Wt 181.2 lb

## 2018-01-04 DIAGNOSIS — I10 Essential (primary) hypertension: Secondary | ICD-10-CM | POA: Diagnosis not present

## 2018-01-04 DIAGNOSIS — Z0000 Encounter for general adult medical examination without abnormal findings: Secondary | ICD-10-CM | POA: Diagnosis not present

## 2018-01-04 DIAGNOSIS — Z125 Encounter for screening for malignant neoplasm of prostate: Secondary | ICD-10-CM | POA: Diagnosis not present

## 2018-01-04 DIAGNOSIS — Z1331 Encounter for screening for depression: Secondary | ICD-10-CM

## 2018-01-04 DIAGNOSIS — Z86010 Personal history of colonic polyps: Secondary | ICD-10-CM

## 2018-01-04 LAB — COMPREHENSIVE METABOLIC PANEL
ALK PHOS: 52 U/L (ref 39–117)
ALT: 56 U/L — AB (ref 0–53)
AST: 35 U/L (ref 0–37)
Albumin: 5.1 g/dL (ref 3.5–5.2)
BILIRUBIN TOTAL: 1 mg/dL (ref 0.2–1.2)
BUN: 13 mg/dL (ref 6–23)
CO2: 29 meq/L (ref 19–32)
CREATININE: 1.02 mg/dL (ref 0.40–1.50)
Calcium: 10.1 mg/dL (ref 8.4–10.5)
Chloride: 101 mEq/L (ref 96–112)
GFR: 79.66 mL/min (ref >60.00)
GLUCOSE: 102 mg/dL — AB (ref 70–99)
Potassium: 4 mEq/L (ref 3.5–5.1)
Sodium: 138 mEq/L (ref 135–145)
Total Protein: 7.7 g/dL (ref 6.0–8.3)

## 2018-01-04 LAB — CBC
HCT: 50 % (ref 39.0–52.0)
Hemoglobin: 17.4 g/dL — ABNORMAL HIGH (ref 13.0–17.0)
MCHC: 34.8 g/dL (ref 30.0–36.0)
MCV: 94 fl (ref 78.0–100.0)
Platelets: 148 10*3/uL — ABNORMAL LOW (ref 150.0–400.0)
RBC: 5.32 Mil/uL (ref 4.22–5.81)
RDW: 12.6 % (ref 11.5–15.5)
WBC: 7.2 10*3/uL (ref 4.0–10.5)

## 2018-01-04 LAB — LIPID PANEL
Cholesterol: 181 mg/dL (ref 0–200)
HDL: 42.4 mg/dL (ref >39.00)
NONHDL: 138.14
Total CHOL/HDL Ratio: 4
Triglycerides: 294 mg/dL — ABNORMAL HIGH (ref 0.0–149.0)
VLDL: 58.8 mg/dL — ABNORMAL HIGH (ref 0.0–40.0)

## 2018-01-04 LAB — POC URINALSYSI DIPSTICK (AUTOMATED)
BILIRUBIN UA: NEGATIVE
Glucose, UA: NEGATIVE
Ketones, UA: NEGATIVE
LEUKOCYTES UA: NEGATIVE
NITRITE UA: NEGATIVE
PH UA: 7 (ref 5.0–8.0)
PROTEIN UA: NEGATIVE
RBC UA: NEGATIVE
Spec Grav, UA: <=1.005 — AB (ref 1.010–1.025)
UROBILINOGEN UA: 0.2 U/dL

## 2018-01-04 LAB — PSA: PSA: 0.21 ng/mL (ref 0.10–4.00)

## 2018-01-04 LAB — LDL CHOLESTEROL, DIRECT: LDL DIRECT: 114 mg/dL

## 2018-01-04 MED ORDER — EPINEPHRINE 0.3 MG/0.3ML IJ SOAJ: 0.3000 mg | Freq: Once | INTRAMUSCULAR | 0 refills | Status: AC

## 2018-01-04 MED ORDER — TEMAZEPAM 15 MG PO CAPS: 15.0000 mg | ORAL_CAPSULE | Freq: Every evening | ORAL | 5 refills | Status: DC | PRN

## 2018-01-04 NOTE — Patient Instructions (Addendum)
Come back for fall flu shot. You can call in for nurse visit  We have added you to our shingrix wait list- we will call you when available (may be months or longer for [place] to get this in)   Try to limit alcohol to 10 a week  I love your goal to drop at least 5 lbs by next visit

## 2018-01-04 NOTE — Progress Notes (Signed)
Phone: [phone]  Subjective:  Patient presents today for their annual physical. Chief complaint-noted.   See problem oriented charting- ROS- full  review of systems was completed and positive for occasional headaches- otherwise negative No chest pain or shortness of breath. No blurry vision unless looks at screen for long time.    The following were reviewed and entered/updated in epic: Past Medical History:  Diagnosis Date  . Allergic rhinitis, cause unspecified    to tick bite - resolved  . Arthritis    knees, hands  . Balanoposthitis    Mild  . Chest pain, unspecified    2007- stress test normal  . Constipation 09/24/2009   Prn metamucil  - occasional  . Dry eye syndrome   . Hearing loss    slight - bilateral - no hearing aids  . HEMORRHOID, EXTERNAL, THROMBOSED 05/06/2010   Qualifier: Diagnosis of  By: Ronnald Ramp MD, Arvid Right.   . History of shingles   . Hypertension   . Migraine, unspecified, without mention of intractable migraine without mention of status migrainosus   . TINNITUS NOS 01/31/2008   Qualifier: Diagnosis of  By: Plotnikov MD, Evie Lacks    Patient Active Problem List   Diagnosis Date Noted  . Allergy to beef 02/17/2016    Priority: High  . Tobacco abuse 11/02/2011    Priority: High  . Insomnia 11/12/2012    Priority: Medium  . Hypertension 11/12/2012    Priority: Medium  . Migraine 09/30/2007    Priority: Medium  . Osteoarthritis 07/22/2014    Priority: Low  . History of colonic polyps 07/22/2014    Priority: Low  . Urinary frequency 09/11/2011    Priority: Low  . LOW BACK PAIN SYNDROME 09/24/2009    Priority: Low  . Dry eyes 09/30/2007    Priority: Low  . Allergic rhinitis 09/30/2007    Priority: Low  . History of shingles 09/30/2007    Priority: Low  . Hemorrhoid 10/30/2014   Past Surgical History:  Procedure Laterality Date  . COLONOSCOPY  2013    hx polyps   . Queets   right-crushed all 4 toes reportedly  .  KNEE ARTHROSCOPY  2011   Right for Condromalacia  . ORIF FEMUR FRACTURE  1993   Right  . WISDOM TOOTH EXTRACTION      Family History  Problem Relation Age of Onset  . Diabetes type II Mother   . Heart disease Mother        CABG-died during this, smoker  . Hyperlipidemia Mother   . Hypertension Mother   . Diabetes Father   . Heart disease Father        thought death due to this, smoker  . Hypertension Father   . Schizophrenia Father   . Colon polyps Sister   . Colon cancer Neg Hx   . Stomach cancer Neg Hx   . Esophageal cancer Neg Hx   . Rectal cancer Neg Hx     Medications- reviewed and updated Current Outpatient Medications  Medication Sig Dispense Refill  . amLODipine (NORVASC) 10 MG tablet Take 1 tablet (10 mg total) by mouth daily. 90 tablet 3  . Ascorbic Acid (VITAMIN C PO) Take by mouth daily.    Marland Kitchen EPINEPHrine 0.3 mg/0.3 mL IJ SOAJ injection Inject 0.3 mLs (0.3 mg total) into the muscle once for 1 dose. As needed for tongue swelling, shortness of breath. Call 911 if need to use. 1 Device 0  .  meloxicam (MOBIC) 15 MG tablet Take 7.5 mg by mouth daily.   2  . metoprolol succinate (TOPROL-XL) 50 MG 24 hr tablet TAKE 2 TABLETS (100 MG TOTAL) BY MOUTH DAILY. TAKE WITH OR IMMEDIATELY FOLLOWING A MEAL. 180 tablet 1  . Multiple Vitamin (MULTI VITAMIN PO) Take by mouth.    Marland Kitchen OVER THE COUNTER MEDICATION Take 1,000 mg by mouth daily. Tumeric    . OVER THE COUNTER MEDICATION CBD oil    . pramoxine-hydrocortisone (ANALPRAM-HC) 1-1 % rectal cream Place 1 application rectally 2 (two) times daily. 30 g 0  . SUMAtriptan (IMITREX) 50 MG tablet TAKE 1 TABLET BY MOUTH AS NEEDED MIGRAINE 6 tablet 1  . temazepam (RESTORIL) 15 MG capsule Take 1 capsule (15 mg total) by mouth at bedtime as needed. for sleep 30 capsule 5  . triamcinolone cream (KENALOG) 0.5 % Apply 1 application topically 2 (two) times daily. For 10 days to affected area 30 g 0   No current facility-administered medications  for this visit.     Allergies-reviewed and updated Allergies  Allergen Reactions  . Codeine Nausea Only    Social History   Social History Narrative   Married-'81. 1 dtr - '87, 1 son - '83, 2 granddaughters   Marriage in good health.       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       Hobbies: time with grandkids, work with cows, minimal travel due to pain    Objective: BP 116/78 (BP Location: Left Arm, Patient Position: Sitting, Cuff Size: Normal)   Pulse 68   Temp 98.4 F (36.9 C) (Oral)   Ht 5\' 6"  (1.676 m)   Wt 181 lb 3.2 oz (82.2 kg)   BMI 29.25 kg/m  Gen: NAD, resting comfortably HEENT: Mucous membranes are moist. Oropharynx normal Neck: no thyromegaly CV: RRR no murmurs rubs or gallops Lungs: CTAB no crackles, wheeze, rhonchi Abdomen: soft/nontender/nondistended/normal bowel sounds. No rebound or guarding.  Ext: no edema Skin: warm, dry, excoriated area on left upper back at site of prior tick bite Neuro: grossly normal, moves all extremities, PERRLA Rectal: normal tone, diffusely enlarged prostate, no masses or tenderness   Assessment/Plan:  58 y.o. male presenting for annual physical.  Health Maintenance counseling: 1. Anticipatory guidance: Patient counseled regarding regular dental exams -q6 months, eye exams -yearly, wearing seatbelts.  2. Risk factor reduction:  Advised patient of need for regular exercise and diet rich and fruits and vegetables to reduce risk of heart attack and stroke. Exercise- has been hard for him with 3rd shift and knees hurting him. Discussed bike or water (states no easy access to water). Diet-has gained 5 lbs in last 6 weeks- we discussed reversing this trend.  Wt Readings from Last 3 Encounters:  01/04/18 181 lb 3.2 oz (82.2 kg)  10/19/17 184 lb (83.5 kg)  12/20/16 176 lb 9.6 oz (80.1 kg)  3. Immunizations/screenings/ancillary  studies- advised fall flu shot. Discussed shingrix .  Immunization History  Administered Date(s) Administered  . Influenza,inj,Quad PF,6+ Mos 02/17/2016, 03/21/2017  . Influenza-Unspecified 03/29/2014  . Tdap 11/12/2013  4. Prostate cancer screening-  will update PSA. Rectal exam low risk - some BPH. Nocturia at least once a night (or day since he sleeps in day)  Lab Results  Component Value Date   PSA 0.30 11/02/2011   PSA 0.25 09/21/2009   5. Colon  cancer screening -  12/27/15 with planned 5 year repeat due to adenoma 2013 6. Skin cancer screening- Dr. Ronnald Ramp has seen him yearly. advised regular sunscreen use. Denies worrisome, changing, or new skin lesions.  7. Former smoker- quit 2015. Will get UA.   Status of chronic or acute concerns   Dyslipidemia- mildly high triglycerides and low HDL. Will calculate 10 year risk of LDL above 100 or trig over 200 and consider statin only after he gets his extra 5 lbs off and ups exercise.   HTN- controlled on metoprolol 100mg  XL, amlodipine 10mg . hasn't checked at home lately. Getting edema with socks.   Migraines- metoprolol helps with prevention. Does get auras. imitrex prn. Cutting down on chocolate and coke and that has helped.   Alpha gal- allergic to beef- see notes back to 2017. #s are better and hes eating meat again- we will send in epi pen just in case  Tick exposure- treated 2019 with doxycycline but didn't meet full criteria for lyme disease. Itches daily on spot that he was bit.   Insomnia- using temazepam for sleep particularly due to 3rd shift work five days a week  Meloxicam 7.5 through Dr. Delilah Shan so we idealy should check kidney function every 6 months. CBD mildly helpful  1 year at latest for physical. Advised 6 months for kidneys and BP check  Lab/Order associations: Preventative health care - Plan: CBC, Comprehensive metabolic panel, Lipid panel, PSA, POCT Urinalysis Dipstick (Automated), POCT Urinalysis Dipstick  (Automated)  Screening for depression  Essential hypertension - Plan: CBC, Comprehensive metabolic panel, Lipid panel, POCT Urinalysis Dipstick (Automated), POCT Urinalysis Dipstick (Automated)  Screening for prostate cancer - Plan: PSA  History of colonic polyps  Meds ordered this encounter  Medications  . EPINEPHrine 0.3 mg/0.3 mL IJ SOAJ injection    Sig: Inject 0.3 mLs (0.3 mg total) into the muscle once for 1 dose. As needed for tongue swelling, shortness of breath. Call 911 if need to use.    Dispense:  1 Device    Refill:  0  . temazepam (RESTORIL) 15 MG capsule    Sig: Take 1 capsule (15 mg total) by mouth at bedtime as needed. for sleep    Dispense:  30 capsule    Refill:  5    Not to exceed 3 additional fills before 11/27/2017    Return precautions advised.  [name], MD

## 2018-01-18 ENCOUNTER — Telehealth: Payer: Self-pay | Admitting: Surgical

## 2018-01-18 DIAGNOSIS — G43109 Migraine with aura, not intractable, without status migrainosus: Secondary | ICD-10-CM | POA: Diagnosis not present

## 2018-01-18 NOTE — Telephone Encounter (Signed)
Left message for patient to call the office to schedule nurse visit for Shingrix injection.

## 2018-03-01 ENCOUNTER — Ambulatory Visit (INDEPENDENT_AMBULATORY_CARE_PROVIDER_SITE_OTHER): Payer: 59

## 2018-03-01 ENCOUNTER — Encounter: Payer: Self-pay | Admitting: Family Medicine

## 2018-03-01 DIAGNOSIS — Z23 Encounter for immunization: Secondary | ICD-10-CM

## 2018-03-01 NOTE — Progress Notes (Signed)
Patient in today for Flu Vaccine. VIS given. Administered in left arm. Tolerated well

## 2018-03-01 NOTE — Patient Instructions (Signed)
There are no preventive care reminders to display for this patient.  Depression screen [facility] 2/9 01/04/2018 12/20/2016  Decreased Interest 0 1  Down, Depressed, Hopeless 0 0  PHQ - 2 Score 0 1

## 2018-04-09 ENCOUNTER — Other Ambulatory Visit: Payer: Self-pay

## 2018-04-09 MED ORDER — AMLODIPINE BESYLATE 10 MG PO TABS: 10.0000 mg | ORAL_TABLET | Freq: Every day | ORAL | 3 refills | Status: DC

## 2018-05-22 ENCOUNTER — Other Ambulatory Visit: Payer: Self-pay | Admitting: Family Medicine

## 2018-05-26 ENCOUNTER — Encounter: Payer: Self-pay | Admitting: Family Medicine

## 2018-05-26 ENCOUNTER — Other Ambulatory Visit: Payer: Self-pay | Admitting: Family Medicine

## 2018-05-26 DIAGNOSIS — K640 First degree hemorrhoids: Secondary | ICD-10-CM

## 2018-05-27 MED ORDER — HYDROCORTISONE ACE-PRAMOXINE 1-1 % RE CREA: 1.0000 "application " | TOPICAL_CREAM | Freq: Two times a day (BID) | RECTAL | 0 refills | Status: DC

## 2018-05-28 ENCOUNTER — Other Ambulatory Visit: Payer: Self-pay

## 2018-05-28 MED ORDER — MELOXICAM 7.5 MG PO TABS: 7.5000 mg | ORAL_TABLET | Freq: Every day | ORAL | 0 refills | Status: DC

## 2018-06-04 DIAGNOSIS — M545 Low back pain: Secondary | ICD-10-CM | POA: Diagnosis not present

## 2018-06-27 ENCOUNTER — Encounter: Payer: Self-pay | Admitting: Family Medicine

## 2018-06-28 NOTE — Telephone Encounter (Signed)
Okay for refill? Please advise 

## 2018-06-29 MED ORDER — SUMATRIPTAN SUCCINATE 50 MG PO TABS: ORAL_TABLET | ORAL | 10 refills | Status: DC

## 2018-07-11 ENCOUNTER — Encounter: Payer: Self-pay | Admitting: Family Medicine

## 2018-07-11 ENCOUNTER — Ambulatory Visit: Payer: 59 | Admitting: Family Medicine

## 2018-07-11 VITALS — BP 120/68 | HR 75 | Temp 98.1°F | Ht 66.0 in | Wt 182.6 lb

## 2018-07-11 DIAGNOSIS — G4700 Insomnia, unspecified: Secondary | ICD-10-CM | POA: Diagnosis not present

## 2018-07-11 DIAGNOSIS — D751 Secondary polycythemia: Secondary | ICD-10-CM

## 2018-07-11 DIAGNOSIS — E785 Hyperlipidemia, unspecified: Secondary | ICD-10-CM

## 2018-07-11 DIAGNOSIS — G43109 Migraine with aura, not intractable, without status migrainosus: Secondary | ICD-10-CM | POA: Diagnosis not present

## 2018-07-11 DIAGNOSIS — I10 Essential (primary) hypertension: Secondary | ICD-10-CM

## 2018-07-11 LAB — COMPREHENSIVE METABOLIC PANEL
ALT: 57 U/L — ABNORMAL HIGH (ref 0–53)
AST: 37 U/L (ref 0–37)
Albumin: 4.9 g/dL (ref 3.5–5.2)
Alkaline Phosphatase: 61 U/L (ref 39–117)
BUN: 14 mg/dL (ref 6–23)
CO2: 26 mEq/L (ref 19–32)
Calcium: 9.7 mg/dL (ref 8.4–10.5)
Chloride: 104 mEq/L (ref 96–112)
Creatinine, Ser: 1.03 mg/dL (ref 0.40–1.50)
GFR: 73.98 mL/min (ref >60.00)
Glucose, Bld: 106 mg/dL — ABNORMAL HIGH (ref 70–99)
Potassium: 3.9 mEq/L (ref 3.5–5.1)
Sodium: 139 mEq/L (ref 135–145)
Total Bilirubin: 0.6 mg/dL (ref 0.2–1.2)
Total Protein: 7.3 g/dL (ref 6.0–8.3)

## 2018-07-11 LAB — CBC WITH DIFFERENTIAL/PLATELET
Basophils Absolute: 0 10*3/uL (ref 0.0–0.1)
Basophils Relative: 0.5 % (ref 0.0–3.0)
EOS PCT: 1.7 % (ref 0.0–5.0)
Eosinophils Absolute: 0.1 10*3/uL (ref 0.0–0.7)
HCT: 50.4 % (ref 39.0–52.0)
Hemoglobin: 17.7 g/dL — ABNORMAL HIGH (ref 13.0–17.0)
Lymphocytes Relative: 27 % (ref 12.0–46.0)
Lymphs Abs: 1.7 10*3/uL (ref 0.7–4.0)
MCHC: 35.1 g/dL (ref 30.0–36.0)
MCV: 93.1 fl (ref 78.0–100.0)
Monocytes Absolute: 0.8 10*3/uL (ref 0.1–1.0)
Monocytes Relative: 12.2 % — ABNORMAL HIGH (ref 3.0–12.0)
NEUTROS ABS: 3.8 10*3/uL (ref 1.4–7.7)
Neutrophils Relative %: 58.6 % (ref 43.0–77.0)
PLATELETS: 160 10*3/uL (ref 150.0–400.0)
RBC: 5.41 Mil/uL (ref 4.22–5.81)
RDW: 12.7 % (ref 11.5–15.5)
WBC: 6.4 10*3/uL (ref 4.0–10.5)

## 2018-07-11 NOTE — Progress Notes (Signed)
Phone [phone]   Subjective:  Jordan Cantrell is a 58 y.o. year old very pleasant male patient who presents for/with See problem oriented charting ROS- sparing migraines. No chest pain or shortness of breath. No  blurry vision- outside of aura.     Past Medical History-  Patient Active Problem List   Diagnosis Date Noted  . Allergy to beef 02/17/2016    Priority: High  . Tobacco abuse 11/02/2011    Priority: High  . Insomnia 11/12/2012    Priority: Medium  . Hypertension 11/12/2012    Priority: Medium  . Migraine 09/30/2007    Priority: Medium  . Osteoarthritis 07/22/2014    Priority: Low  . History of colonic polyps 07/22/2014    Priority: Low  . Urinary frequency 09/11/2011    Priority: Low  . LOW BACK PAIN SYNDROME 09/24/2009    Priority: Low  . Dry eyes 09/30/2007    Priority: Low  . Allergic rhinitis 09/30/2007    Priority: Low  . History of shingles 09/30/2007    Priority: Low  . Hemorrhoid 10/30/2014    Medications- reviewed and updated Current Outpatient Medications  Medication Sig Dispense Refill  . amLODipine (NORVASC) 10 MG tablet Take 1 tablet (10 mg total) by mouth daily. 90 tablet 3  . Ascorbic Acid (VITAMIN C PO) Take by mouth daily.    . meloxicam (MOBIC) 7.5 MG tablet Take 1 tablet (7.5 mg total) by mouth daily. 90 tablet 0  . metoprolol succinate (TOPROL-XL) 50 MG 24 hr tablet TAKE 2 TABLETS BY MOUTH DAILY. TAKE WITH OR IMMEDIATELY FOLLOWING A MEAL. 180 tablet 1  . Multiple Vitamin (MULTI VITAMIN PO) Take by mouth.    Marland Kitchen OVER THE COUNTER MEDICATION Take 1,000 mg by mouth daily. Tumeric    . pramoxine-hydrocortisone (ANALPRAM-HC) 1-1 % rectal cream Place 1 application rectally 2 (two) times daily. 30 g 0  . SUMAtriptan (IMITREX) 50 MG tablet TAKE 1 TABLET BY MOUTH AS NEEDED MIGRAINE 6 tablet 10  . temazepam (RESTORIL) 15 MG capsule Take 1 capsule (15 mg total) by mouth at bedtime as needed. for sleep 30 capsule 5  . triamcinolone cream (KENALOG)  0.5 % Apply 1 application topically 2 (two) times daily. For 10 days to affected area 30 g 0   No current facility-administered medications for this visit.      Objective:  BP 120/68 (BP Location: Left Arm, Patient Position: Sitting, Cuff Size: Large)   Pulse 75   Temp 98.1 F (36.7 C) (Oral)   Ht 5\' 6"  (1.676 m)   Wt 182 lb 9.6 oz (82.8 kg)   SpO2 97%   BMI 29.47 kg/m  Gen: NAD, resting comfortably CV: RRR no murmurs rubs or gallops Lungs: CTAB no crackles, wheeze, rhonchi Ext: no edema Skin: warm, dry Neuro: Normal gait and speech    Assessment and Plan  #Hypertension S: Compliant with amlodipine 10 mg, metoprolol 100 mg extended release A/P: Stable. Continue current medications.  We are considering reducing amlodipine or metorpolol if #s stay as well controlled as they are or improve further- I think its doing really with his increased walking  - need to check bmp each visit given meloxicam use through Dr. Delilah Cantrell   #Hypertriglyceridemia  S:mild poor control of triglycerides last visit  A/P: suspect this will improve with his increased exercise- we opted to defer until next visit    #Insomnia S: Compliant with temazepam 15 mg - but hasnt needed since being on 1st shift-  A/P: doing well- continue temazepam if needed but luckily hasnt needed  #Migraines S: Compliant with sumatriptan-uses about twice a year . No clear trigger A/P: Stable. Continue current medications.    #Polycythemia-former smoker-has not smoked since 2015 but does get a lot of smoke exposure at work.  Other notes: 1. Went up from 2-3 miles a day to 6-7 miles a day once getting apple watch. With increase has had some burning in bottom of feet. History rush injury on right foot. Only burns with walking half mile or more. Weight largely stable. He wants to adjust socks and shoes first- ok with holding off on neuropathy workup.  2. Getting up at 2 am for work which is hard on him but getting to go to bed  closer to normal time with this adjustment though.  3. On meloxicam - so needs bmp every 6 months  Advised 43-month physical if he is due  Lab/Order associations: pop tart at 2 30 AM and cup of coffee, egg and bacon at 7, nabs at 8 30 Hyperlipidemia, unspecified hyperlipidemia type  Essential hypertension - Plan: CBC with Differential/Platelet, Comprehensive metabolic panel  Insomnia, unspecified type  Migraine with aura and without status migrainosus, not intractable  Polycythemia  Return precautions advised.  Jordan Reddish, MD

## 2018-07-11 NOTE — Patient Instructions (Addendum)
Please stop by lab before you go If you do not have mychart- we will call you about results within 5 business days of [place] receiving them.  If you have mychart- we will send your results within 3 business days of [place] receiving them.  If abnormal or we want to clarify a result, we will call or mychart you to make sure you receive the message.  If you have questions or concerns or don't hear within 5-7 days, please send [place] a message or call us.    No changes planned today- if blood pressure continues to look this good - may consider reducing dose slightly

## 2018-07-12 ENCOUNTER — Ambulatory Visit: Payer: 59 | Admitting: Family Medicine

## 2018-08-22 ENCOUNTER — Other Ambulatory Visit: Payer: Self-pay | Admitting: Family Medicine

## 2018-09-09 ENCOUNTER — Other Ambulatory Visit: Payer: Self-pay | Admitting: Family Medicine

## 2018-09-09 NOTE — Telephone Encounter (Signed)
Rx request  Last OV 07/11/18 Last filled 01/04/2018  5 refills

## 2018-11-23 ENCOUNTER — Other Ambulatory Visit: Payer: Self-pay | Admitting: Family Medicine

## 2018-12-16 ENCOUNTER — Encounter: Payer: Self-pay | Admitting: Physician Assistant

## 2018-12-16 ENCOUNTER — Other Ambulatory Visit: Payer: Self-pay

## 2018-12-16 ENCOUNTER — Ambulatory Visit (INDEPENDENT_AMBULATORY_CARE_PROVIDER_SITE_OTHER): Payer: 59 | Admitting: Physician Assistant

## 2018-12-16 DIAGNOSIS — Z7189 Other specified counseling: Secondary | ICD-10-CM

## 2018-12-16 DIAGNOSIS — J029 Acute pharyngitis, unspecified: Secondary | ICD-10-CM | POA: Diagnosis not present

## 2018-12-16 DIAGNOSIS — Z20822 Contact with and (suspected) exposure to covid-19: Secondary | ICD-10-CM

## 2018-12-16 DIAGNOSIS — Z20828 Contact with and (suspected) exposure to other viral communicable diseases: Secondary | ICD-10-CM | POA: Diagnosis not present

## 2018-12-16 MED ORDER — PREDNISONE 20 MG PO TABS: 40.0000 mg | ORAL_TABLET | Freq: Every day | ORAL | 0 refills | Status: DC

## 2018-12-16 MED ORDER — AMOXICILLIN 875 MG PO TABS: 875.0000 mg | ORAL_TABLET | Freq: Two times a day (BID) | ORAL | 0 refills | Status: DC

## 2018-12-16 NOTE — Progress Notes (Signed)
Virtual Visit via Video   I connected with Jordan Cantrell on 12/16/18 at  9:20 AM EDT by a video enabled telemedicine application and verified that I am speaking with the correct person using two identifiers. Location patient: Home Location provider: [facility], Office Persons participating in the virtual visit: [place], Hilleary PA-C.  I discussed the limitations of evaluation and management by telemedicine and the availability of in person appointments. The patient expressed understanding and agreed to proceed.  I acted as a Education administrator for [employer], PA-C [employer], LPN  Subjective:   HPI:   Sore throat Pt c/o sore throat started on Friday and getting worse. Difficulty swallowing. Doing salt water gargles and antiseptic spray. Taking Tylenol and Sudafed with little relief. Denies fever and headache.  Has noticed swollen gland on left side. Denies ear pain. Has some loss in taste and smell.  Works at a tobacco factory. Currently uses snuff.  Denies: cough, chest pain, SOB  ROS: See pertinent positives and negatives per HPI.  Patient Active Problem List   Diagnosis Date Noted  . Allergy to beef 02/17/2016  . Hemorrhoid 10/30/2014  . Osteoarthritis 07/22/2014  . History of colonic polyps 07/22/2014  . Insomnia 11/12/2012  . Hypertension 11/12/2012  . Tobacco abuse 11/02/2011  . Urinary frequency 09/11/2011  . LOW BACK PAIN SYNDROME 09/24/2009  . Migraine 09/30/2007  . Dry eyes 09/30/2007  . Allergic rhinitis 09/30/2007  . History of shingles 09/30/2007    Social History   Tobacco Use  . Smoking status: Former Smoker    Packs/day: 0.10    Types: Cigarettes    Quit date: 04/28/2014    Years since quitting: 4.6  . Smokeless tobacco: Current User    Types: Snuff  Substance Use Topics  . Alcohol use: Yes    Alcohol/week: 10.0 - 12.0 standard drinks    Types: 10 - 12 Cans of beer per week    Comment: beer/liquor    Current Outpatient  Medications:  .  amLODipine (NORVASC) 10 MG tablet, Take 1 tablet (10 mg total) by mouth daily., Disp: 90 tablet, Rfl: 3 .  meloxicam (MOBIC) 7.5 MG tablet, TAKE 1 TABLET BY MOUTH EVERY DAY, Disp: 90 tablet, Rfl: 0 .  metoprolol succinate (TOPROL-XL) 50 MG 24 hr tablet, TAKE 2 TABLETS BY MOUTH EVERY DAY WITH OR IMMEDIATELY FOLLOWING A MEAL, Disp: 180 tablet, Rfl: 1 .  Multiple Vitamin (MULTI VITAMIN PO), Take by mouth., Disp: , Rfl:  .  OVER THE COUNTER MEDICATION, Take 1,000 mg by mouth daily. Tumeric, Disp: , Rfl:  .  pramoxine-hydrocortisone (ANALPRAM-HC) 1-1 % rectal cream, Place 1 application rectally 2 (two) times daily., Disp: 30 g, Rfl: 0 .  SUMAtriptan (IMITREX) 50 MG tablet, TAKE 1 TABLET BY MOUTH AS NEEDED MIGRAINE, Disp: 6 tablet, Rfl: 10 .  temazepam (RESTORIL) 15 MG capsule, TAKE 1 CAPSULE (15 MG TOTAL) BY MOUTH AT BEDTIME AS NEEDED. FOR SLEEP, Disp: 30 capsule, Rfl: 5 .  triamcinolone cream (KENALOG) 0.5 %, Apply 1 application topically 2 (two) times daily. For 10 days to affected area (Patient taking differently: Apply 1 application topically as needed. For 10 days to affected area), Disp: 30 g, Rfl: 0 .  amoxicillin (AMOXIL) 875 MG tablet, Take 1 tablet (875 mg total) by mouth 2 (two) times daily., Disp: 20 tablet, Rfl: 0 .  predniSONE (DELTASONE) 20 MG tablet, Take 2 tablets (40 mg total) by mouth daily., Disp: 10 tablet, Rfl: 0  Allergies  Allergen Reactions  . Codeine Nausea Only    Objective:   VITALS: Per patient if applicable, see vitals. GENERAL: Alert, appears well and in no acute distress. HEENT: Atraumatic, conjunctiva clear, no obvious abnormalities on inspection of external nose and ears. NECK: Normal movements of the head and neck. CARDIOPULMONARY: No increased WOB. Speaking in clear sentences. I:E ratio WNL.  MS: Moves all visible extremities without noticeable abnormality. PSYCH: Pleasant and cooperative, well-groomed. Speech normal rate and rhythm. Affect is  appropriate. Insight and judgement are appropriate. Attention is focused, linear, and appropriate.  NEURO: CN grossly intact. Oriented as arrived to appointment on time with no prompting. Moves both UE equally.  SKIN: No obvious lesions, wounds, erythema, or cyanosis noted on face or hands.  Assessment and Plan:   Rishan was seen today for sore throat.  Diagnoses and all orders for this visit:  Sore throat; Advice Given About Covid-19 Virus Infection We are going to send patient for drive-up testing. As a precaution, they have been advised to remain home until COVID-19 results and then possible further quarantine after that based on results and symptoms. Advised if they experience a "second sickening" or worsening symptoms as the illness progresses, they are to call the office for further instructions or seek emergent evaluation for any severe symptoms.  -     Novel Coronavirus, NAA ([facility])  *Did discuss with patient that I cannot rule out strep at this time. Will empirically treat with amoxicillin. Will also start oral prednisone. I did discuss that he should stop oral mobic while on this medication, and may resume after completion if needed.  Other orders -     amoxicillin (AMOXIL) 875 MG tablet; Take 1 tablet (875 mg total) by mouth 2 (two) times daily. -     predniSONE (DELTASONE) 20 MG tablet; Take 2 tablets (40 mg total) by mouth daily.    . Reviewed expectations re: course of current medical issues. . Discussed self-management of symptoms. . Outlined signs and symptoms indicating need for more acute intervention. . Patient verbalized understanding and all questions were answered. Marland Kitchen Health Maintenance issues including appropriate healthy diet, exercise, and smoking avoidance were discussed with patient. . See orders for this visit as documented in the electronic medical record.  I discussed the assessment and treatment plan with the patient. The patient was provided an  opportunity to ask questions and all were answered. The patient agreed with the plan and demonstrated an understanding of the instructions.   The patient was advised to call back or seek an in-person evaluation if the symptoms worsen or if the condition fails to improve as anticipated.   CMA or LPN served as scribe during this visit. History, Physical, and Plan performed by medical provider. The above documentation has been reviewed and is accurate and complete.   [place], [place] 12/16/2018

## 2018-12-17 ENCOUNTER — Encounter: Payer: Self-pay | Admitting: Physician Assistant

## 2018-12-17 LAB — NOVEL CORONAVIRUS, NAA: SARS-CoV-2, NAA: NOT DETECTED

## 2018-12-18 ENCOUNTER — Encounter: Payer: Self-pay | Admitting: Physician Assistant

## 2018-12-18 ENCOUNTER — Ambulatory Visit: Payer: 59 | Admitting: Physician Assistant

## 2018-12-18 ENCOUNTER — Other Ambulatory Visit: Payer: Self-pay

## 2018-12-18 ENCOUNTER — Telehealth: Payer: Self-pay | Admitting: Family Medicine

## 2018-12-18 VITALS — BP 140/80 | HR 68 | Temp 98.1°F | Ht 66.0 in | Wt 181.0 lb

## 2018-12-18 DIAGNOSIS — J029 Acute pharyngitis, unspecified: Secondary | ICD-10-CM | POA: Diagnosis not present

## 2018-12-18 MED ORDER — MAGIC MOUTHWASH W/LIDOCAINE: 5.0000 mL | Freq: Three times a day (TID) | ORAL | 0 refills | Status: AC | PRN

## 2018-12-18 MED ORDER — AMOXICILLIN-POT CLAVULANATE 875-125 MG PO TABS: 1.0000 | ORAL_TABLET | Freq: Two times a day (BID) | ORAL | 0 refills | Status: DC

## 2018-12-18 MED ORDER — METHYLPREDNISOLONE ACETATE 80 MG/ML IJ SUSP
80.0000 mg | Freq: Once | INTRAMUSCULAR | Status: AC
  Administered 2018-12-18: 80 mg via INTRAMUSCULAR

## 2018-12-18 NOTE — Telephone Encounter (Signed)
Pt had a virtual appt with samantha on Monday and he is still having severe sore throat. Pt is still taking abx and prednisone. Pt would like to know if he can go back to work with sore throat is he contagious also is mono and strep treated with same medication

## 2018-12-18 NOTE — Telephone Encounter (Signed)
Forwarding to [employer] to advise.

## 2018-12-18 NOTE — Telephone Encounter (Signed)
Patient was seen in the office with me this afternoon to address these questions.

## 2018-12-18 NOTE — Patient Instructions (Signed)
It was great to see you!  1. Stop amoxicillin 2. Start oral augmentin 3. May resume prednisone tomorrow 4. Use magic mouthwash as needed  Please send me a message when you would like to return to work and I will release you.  Contact a health care provider if:  The glands in your neck continue to get bigger.  You develop a rash, cough, or earache.  You cough up a thick mucus that is green, yellow-brown, or bloody.  You have pain or discomfort that does not get better with medicine.  Your symptoms seem to be getting worse and not better.  You have a fever. Get help right away if:  You have new symptoms, such as vomiting, severe headache, stiff or painful neck, chest pain, or shortness of breath.  You have severe throat pain, drooling, or changes in your voice.  You have swelling of the neck, or the skin on the neck becomes red and tender.  You have signs of dehydration, such as tiredness (fatigue), dry mouth, and decreased urination.  You become increasingly sleepy, or you cannot wake up completely.  Your joints become red or painful. Summary  Strep throat is an infection in the throat that is caused by the Streptococcus pyogenes bacteria. This infection is spread from person to person (is contagious) through coughing, sneezing, or having close contact.  Take your medicines, including antibiotics, as told by your health care provider. Do not stop taking the antibiotic even if you start to feel better.  To prevent the spread of germs, wash your hands well with soap and water. Have others do the same. Do not share food, drinking cups, or personal items.  Get help right away if you have new symptoms, such as vomiting, severe headache, stiff or painful neck, chest pain, or shortness of breath.  Take care,  Inda Coke PA-C

## 2018-12-18 NOTE — Progress Notes (Signed)
Jordan Cantrell is a 58 y.o. male here for a follow up of a pre-existing problem.  I acted as a Education administrator for [employer], PA-C Anselmo Pickler, LPN  History of Present Illness:   Chief Complaint  Patient presents with  . Sore Throat    Sore Throat  This is a recurrent problem. Episode onset: Started on Friday night. The problem has been gradually improving. The pain is worse on the left side. There has been no fever. The pain is at a severity of 7/10 (when swallowing). Associated symptoms include headaches (Migraine last Thurs), neck pain (Left side) and trouble swallowing. Pertinent negatives include no congestion, coughing, diarrhea, ear pain, shortness of breath or swollen glands. He has had no exposure to strep or mono. He has tried acetaminophen and gargles for the symptoms. The treatment provided mild relief.   COVID test done earlier this week was negative.   Past Medical History:  Diagnosis Date  . Allergic rhinitis, cause unspecified    to tick bite - resolved  . Arthritis    knees, hands  . Balanoposthitis    Mild  . Chest pain, unspecified    2007- stress test normal  . Constipation 09/24/2009   Prn metamucil  - occasional  . Dry eye syndrome   . Hearing loss    slight - bilateral - no hearing aids  . HEMORRHOID, EXTERNAL, THROMBOSED 05/06/2010   Qualifier: Diagnosis of  By: Ronnald Ramp MD, Arvid Right.   . History of shingles   . Hypertension   . Migraine, unspecified, without mention of intractable migraine without mention of status migrainosus   . TINNITUS NOS 01/31/2008   Qualifier: Diagnosis of  By: Alain Marion MD, Evie Lacks      Social History   Socioeconomic History  . Marital status: Married    Spouse name: Not on file  . Number of children: 2  . Years of education: 41  . Highest education level: Not on file  Occupational History  . Occupation: Music therapist: [employer]  . Financial resource strain: Not on file  . Food  insecurity    Worry: Not on file    Inability: Not on file  . Transportation needs    Medical: Not on file    Non-medical: Not on file  Tobacco Use  . Smoking status: Former Smoker    Packs/day: 0.10    Types: Cigarettes    Quit date: 04/28/2014    Years since quitting: 4.6  . Smokeless tobacco: Current User    Types: Snuff  Substance and Sexual Activity  . Alcohol use: Yes    Alcohol/week: 10.0 - 12.0 standard drinks    Types: 10 - 12 Cans of beer per week    Comment: beer/liquor  . Drug use: No  . Sexual activity: Yes    Partners: Female  Lifestyle  . Physical activity    Days per week: Not on file    Minutes per session: Not on file  . Stress: Not on file  Relationships  . Social Herbalist on phone: Not on file    Gets together: Not on file    Attends religious service: Not on file    Active member of club or organization: Not on file    Attends meetings of clubs or organizations: Not on file    Relationship status: Not on file  . Intimate partner violence    Fear of current or ex  partner: Not on file    Emotionally abused: Not on file    Physically abused: Not on file    Forced sexual activity: Not on file  Other Topics Concern  . Not on file  Social History Narrative   Married-'81. 1 dtr - '87, 1 son - '83, 2 granddaughters   Marriage in good health.       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       Hobbies: time with grandkids, work with cows, minimal travel due to pain    Past Surgical History:  Procedure Laterality Date  . COLONOSCOPY  2013    hx polyps   . [place]   right-crushed all 4 toes reportedly  . KNEE ARTHROSCOPY  2011   Right for Condromalacia  . ORIF FEMUR FRACTURE  1993   Right  . WISDOM TOOTH EXTRACTION      Family History  Problem Relation Age of Onset  . Diabetes type II Mother   . Heart disease  Mother        CABG-died during this, smoker  . Hyperlipidemia Mother   . Hypertension Mother   . Diabetes Father   . Heart disease Father        thought death due to this, smoker  . Hypertension Father   . Schizophrenia Father   . Colon polyps Sister   . Colon cancer Neg Hx   . Stomach cancer Neg Hx   . Esophageal cancer Neg Hx   . Rectal cancer Neg Hx     Allergies  Allergen Reactions  . Codeine Nausea Only    Current Medications:   Current Outpatient Medications:  .  amLODipine (NORVASC) 10 MG tablet, Take 1 tablet (10 mg total) by mouth daily., Disp: 90 tablet, Rfl: 3 .  amoxicillin (AMOXIL) 875 MG tablet, Take 1 tablet (875 mg total) by mouth 2 (two) times daily., Disp: 20 tablet, Rfl: 0 .  meloxicam (MOBIC) 7.5 MG tablet, TAKE 1 TABLET BY MOUTH EVERY DAY, Disp: 90 tablet, Rfl: 0 .  metoprolol succinate (TOPROL-XL) 50 MG 24 hr tablet, TAKE 2 TABLETS BY MOUTH EVERY DAY WITH OR IMMEDIATELY FOLLOWING A MEAL, Disp: 180 tablet, Rfl: 1 .  Multiple Vitamin (MULTI VITAMIN PO), Take by mouth., Disp: , Rfl:  .  OVER THE COUNTER MEDICATION, Take 1,000 mg by mouth daily. Tumeric, Disp: , Rfl:  .  pramoxine-hydrocortisone (ANALPRAM-HC) 1-1 % rectal cream, Place 1 application rectally 2 (two) times daily., Disp: 30 g, Rfl: 0 .  predniSONE (DELTASONE) 20 MG tablet, Take 2 tablets (40 mg total) by mouth daily., Disp: 10 tablet, Rfl: 0 .  SUMAtriptan (IMITREX) 50 MG tablet, TAKE 1 TABLET BY MOUTH AS NEEDED MIGRAINE, Disp: 6 tablet, Rfl: 10 .  temazepam (RESTORIL) 15 MG capsule, TAKE 1 CAPSULE (15 MG TOTAL) BY MOUTH AT BEDTIME AS NEEDED. FOR SLEEP, Disp: 30 capsule, Rfl: 5 .  triamcinolone cream (KENALOG) 0.5 %, Apply 1 application topically 2 (two) times daily. For 10 days to affected area (Patient taking differently: Apply 1 application topically as needed. For 10 days to affected area), Disp: 30 g, Rfl: 0 .  amoxicillin-clavulanate (AUGMENTIN) 875-125 MG tablet, Take 1 tablet by mouth 2 (two)  times daily., Disp: 20 tablet, Rfl: 0 .  magic mouthwash w/lidocaine SOLN, Take 5 mLs by mouth 3 (three) times daily as  needed for up to 10 days for mouth pain. Swish and gargle, do not swallow., Disp: 120 mL, Rfl: 0   Review of Systems:   Review of Systems  HENT: Positive for trouble swallowing. Negative for congestion and ear pain.   Respiratory: Negative for cough and shortness of breath.   Gastrointestinal: Negative for diarrhea.  Musculoskeletal: Positive for neck pain (Left side).  Neurological: Positive for headaches (Migraine last Thurs).    Vitals:   Vitals:   12/18/18 1342  BP: 140/80  Pulse: 68  Temp: 98.1 F (36.7 C)  TempSrc: Temporal  SpO2: 94%  Weight: 181 lb (82.1 kg)  Height: 5\' 6"  (1.676 m)     Body mass index is 29.21 kg/m.  Physical Exam:   Physical Exam Vitals signs and nursing note reviewed.  Constitutional:      General: He is not in acute distress.    Appearance: He is well-developed. He is not ill-appearing or toxic-appearing.  HENT:     Head: Normocephalic and atraumatic.     Right Ear: Tympanic membrane, ear canal and external ear normal. Tympanic membrane is not erythematous, retracted or bulging.     Left Ear: Tympanic membrane, ear canal and external ear normal. Tympanic membrane is not erythematous, retracted or bulging.     Nose: Nose normal.     Right Sinus: No maxillary sinus tenderness or frontal sinus tenderness.     Left Sinus: No maxillary sinus tenderness or frontal sinus tenderness.     Mouth/Throat:     Lips: Pink.     Pharynx: Uvula midline. Posterior oropharyngeal erythema present.     Tonsils: 1+ on the right. 2+ on the left.  Eyes:     General: Lids are normal.     Conjunctiva/sclera: Conjunctivae normal.  Neck:     Trachea: Trachea normal.  Cardiovascular:     Rate and Rhythm: Normal rate and regular rhythm.     Heart sounds: Normal heart sounds, S1 normal and S2 normal.  Pulmonary:     Effort: Pulmonary effort is  normal.     Breath sounds: Normal breath sounds. No decreased breath sounds, wheezing, rhonchi or rales.  Lymphadenopathy:     Cervical: No cervical adenopathy.  Skin:    General: Skin is warm and dry.  Neurological:     Mental Status: He is alert.  Psychiatric:        Speech: Speech normal.        Behavior: Behavior normal. Behavior is cooperative.     Results for orders placed or performed in visit on 12/16/18  Novel Coronavirus, NAA ([facility])   Specimen: Oropharyngeal(OP) collection in vial transport medium   OROPHARYNGEA  TESTING  Result Value Ref Range   SARS-CoV-2, NAA Not Detected Not Detected    Assessment and Plan:   Dariel was seen today for sore throat.  Diagnoses and all orders for this visit:  Sore throat Slowly improving, will increase coverage from amoxicillin to augmentin. Depo-medrol given in office today, tolerated well. Also given prn magic mouthwash. If still no improvement, will consider imaging vs ENT. Reviewed red flags and worsening precautions. -     methylPREDNISolone acetate (DEPO-MEDROL) injection 80 mg  Other orders -     magic mouthwash w/lidocaine SOLN; Take 5 mLs by mouth 3 (three) times daily as needed for up to 10 days for mouth pain. Swish and gargle, do not swallow. -     amoxicillin-clavulanate (AUGMENTIN) 875-125 MG tablet; Take 1 tablet by mouth  2 (two) times daily.    . Reviewed expectations re: course of current medical issues. . Discussed self-management of symptoms. . Outlined signs and symptoms indicating need for more acute intervention. . Patient verbalized understanding and all questions were answered. . See orders for this visit as documented in the electronic medical record. . Patient received an After-Visit Summary.  CMA or LPN served as scribe during this visit. History, Physical, and Plan performed by medical provider. The above documentation has been reviewed and is accurate and complete.   Inda Coke, PA-C

## 2018-12-19 ENCOUNTER — Encounter: Payer: Self-pay | Admitting: Physician Assistant

## 2018-12-23 ENCOUNTER — Encounter: Payer: Self-pay | Admitting: Physician Assistant

## 2018-12-23 DIAGNOSIS — Z0279 Encounter for issue of other medical certificate: Secondary | ICD-10-CM

## 2019-03-16 ENCOUNTER — Other Ambulatory Visit: Payer: Self-pay | Admitting: Family Medicine

## 2019-03-19 ENCOUNTER — Encounter: Payer: Self-pay | Admitting: Family Medicine

## 2019-03-19 ENCOUNTER — Ambulatory Visit (INDEPENDENT_AMBULATORY_CARE_PROVIDER_SITE_OTHER): Payer: 59 | Admitting: Family Medicine

## 2019-03-19 ENCOUNTER — Other Ambulatory Visit: Payer: Self-pay

## 2019-03-19 VITALS — BP 130/74 | HR 68 | Temp 97.6°F | Ht 66.0 in | Wt 183.2 lb

## 2019-03-19 DIAGNOSIS — I10 Essential (primary) hypertension: Secondary | ICD-10-CM

## 2019-03-19 DIAGNOSIS — Z125 Encounter for screening for malignant neoplasm of prostate: Secondary | ICD-10-CM | POA: Diagnosis not present

## 2019-03-19 DIAGNOSIS — Z23 Encounter for immunization: Secondary | ICD-10-CM | POA: Diagnosis not present

## 2019-03-19 DIAGNOSIS — Z91014 Allergy to mammalian meats: Secondary | ICD-10-CM

## 2019-03-19 DIAGNOSIS — Z0000 Encounter for general adult medical examination without abnormal findings: Secondary | ICD-10-CM | POA: Diagnosis not present

## 2019-03-19 DIAGNOSIS — Z87891 Personal history of nicotine dependence: Secondary | ICD-10-CM

## 2019-03-19 DIAGNOSIS — Z91018 Allergy to other foods: Secondary | ICD-10-CM

## 2019-03-19 DIAGNOSIS — G4700 Insomnia, unspecified: Secondary | ICD-10-CM | POA: Diagnosis not present

## 2019-03-19 DIAGNOSIS — G43109 Migraine with aura, not intractable, without status migrainosus: Secondary | ICD-10-CM

## 2019-03-19 LAB — COMPREHENSIVE METABOLIC PANEL
ALT: 63 U/L — ABNORMAL HIGH (ref 0–53)
AST: 38 U/L — ABNORMAL HIGH (ref 0–37)
Albumin: 5 g/dL (ref 3.5–5.2)
Alkaline Phosphatase: 53 U/L (ref 39–117)
BUN: 12 mg/dL (ref 6–23)
CO2: 25 mEq/L (ref 19–32)
Calcium: 9.5 mg/dL (ref 8.4–10.5)
Chloride: 103 mEq/L (ref 96–112)
Creatinine, Ser: 0.92 mg/dL (ref 0.40–1.50)
GFR: 84.08 mL/min (ref >60.00)
Glucose, Bld: 107 mg/dL — ABNORMAL HIGH (ref 70–99)
Potassium: 4.1 mEq/L (ref 3.5–5.1)
Sodium: 139 mEq/L (ref 135–145)
Total Bilirubin: 0.8 mg/dL (ref 0.2–1.2)
Total Protein: 7.3 g/dL (ref 6.0–8.3)

## 2019-03-19 LAB — CBC WITH DIFFERENTIAL/PLATELET
Basophils Absolute: 0 10*3/uL (ref 0.0–0.1)
Basophils Relative: 0.5 % (ref 0.0–3.0)
Eosinophils Absolute: 0.1 10*3/uL (ref 0.0–0.7)
Eosinophils Relative: 1.5 % (ref 0.0–5.0)
HCT: 48.8 % (ref 39.0–52.0)
Hemoglobin: 16.8 g/dL (ref 13.0–17.0)
Lymphocytes Relative: 29.5 % (ref 12.0–46.0)
Lymphs Abs: 1.9 10*3/uL (ref 0.7–4.0)
MCHC: 34.4 g/dL (ref 30.0–36.0)
MCV: 95.6 fl (ref 78.0–100.0)
Monocytes Absolute: 0.8 10*3/uL (ref 0.1–1.0)
Monocytes Relative: 12.3 % — ABNORMAL HIGH (ref 3.0–12.0)
Neutro Abs: 3.6 10*3/uL (ref 1.4–7.7)
Neutrophils Relative %: 56.2 % (ref 43.0–77.0)
Platelets: 143 10*3/uL — ABNORMAL LOW (ref 150.0–400.0)
RBC: 5.1 Mil/uL (ref 4.22–5.81)
RDW: 12.8 % (ref 11.5–15.5)
WBC: 6.5 10*3/uL (ref 4.0–10.5)

## 2019-03-19 LAB — LIPID PANEL
Cholesterol: 183 mg/dL (ref 0–200)
HDL: 41.2 mg/dL (ref >39.00)
NonHDL: 141.43
Total CHOL/HDL Ratio: 4
Triglycerides: 246 mg/dL — ABNORMAL HIGH (ref 0.0–149.0)
VLDL: 49.2 mg/dL — ABNORMAL HIGH (ref 0.0–40.0)

## 2019-03-19 LAB — POC URINALSYSI DIPSTICK (AUTOMATED)
Bilirubin, UA: NEGATIVE
Blood, UA: NEGATIVE
Glucose, UA: NEGATIVE
Ketones, UA: POSITIVE
Leukocytes, UA: NEGATIVE
Nitrite, UA: NEGATIVE
Protein, UA: NEGATIVE
Spec Grav, UA: 1.01 (ref 1.010–1.025)
Urobilinogen, UA: 0.2 E.U./dL
pH, UA: 7 (ref 5.0–8.0)

## 2019-03-19 LAB — LDL CHOLESTEROL, DIRECT: Direct LDL: 112 mg/dL

## 2019-03-19 LAB — PSA: PSA: 0.17 ng/mL (ref 0.10–4.00)

## 2019-03-19 MED ORDER — AMLODIPINE BESYLATE 10 MG PO TABS: 10.0000 mg | ORAL_TABLET | Freq: Every day | ORAL | 3 refills | Status: DC

## 2019-03-19 MED ORDER — TEMAZEPAM 15 MG PO CAPS: 15.0000 mg | ORAL_CAPSULE | Freq: Every evening | ORAL | 5 refills | Status: DC | PRN

## 2019-03-19 MED ORDER — SILDENAFIL CITRATE 20 MG PO TABS: ORAL_TABLET | ORAL | 3 refills | Status: DC

## 2019-03-19 NOTE — Patient Instructions (Addendum)
Health Maintenance Due  Topic Date Due  . INFLUENZA VACCINE  Will get today  12/14/2018   . Consider Shingrix next visit at 6 month visit     His long term personal goal is in 160s. We discussed by next year working on even just 5-10 lbs off   Please stop by lab before you go If you do not have mychart- we will call you about results within 5 business days of [place] receiving them.  If you have mychart- we will send your results within 3 business days of [place] receiving them.  If abnormal or we want to clarify a result, we will call or mychart you to make sure you receive the message.  If you have questions or concerns or don't hear within 5-7 days, please send [place] a message or call us.

## 2019-03-19 NOTE — Progress Notes (Signed)
Phone: [phone]   Subjective:  Patient presents today for their annual physical. Chief complaint-noted.   See problem oriented charting- Review of Systems  Constitutional: Negative.   HENT: Negative.   Eyes: Negative.   Respiratory: Negative.   Cardiovascular: Negative.   Gastrointestinal: Negative.   Genitourinary: Negative.   Musculoskeletal: Positive for joint pain.       Left hip right shoulder for 6 months   Skin: Negative.   Neurological: Negative.   Endo/Heme/Allergies: Negative.   Psychiatric/Behavioral: Negative.   - full  review of systems was completed and negative  except for: right shoulder pain thinks may be due to sleeping and left hip pain with some movements. Both have been bothering for about 6 months. He does take the Mobic 7.5 and that helps.   The following were reviewed and entered/updated in epic: Past Medical History:  Diagnosis Date  . Allergic rhinitis, cause unspecified    to tick bite - resolved  . Arthritis    knees, hands  . Balanoposthitis    Mild  . Chest pain, unspecified    2007- stress test normal  . Constipation 09/24/2009   Prn metamucil  - occasional  . Dry eye syndrome   . Hearing loss    slight - bilateral - no hearing aids  . HEMORRHOID, EXTERNAL, THROMBOSED 05/06/2010   Qualifier: Diagnosis of  By: Ronnald Ramp MD, Arvid Right.   . History of shingles   . Hypertension   . Migraine, unspecified, without mention of intractable migraine without mention of status migrainosus   . TINNITUS NOS 01/31/2008   Qualifier: Diagnosis of  By: Plotnikov MD, Evie Lacks    Patient Active Problem List   Diagnosis Date Noted  . Allergy to beef 02/17/2016    Priority: High  . Tobacco abuse 11/02/2011    Priority: High  . Insomnia 11/12/2012    Priority: Medium  . Hypertension 11/12/2012    Priority: Medium  . Migraine 09/30/2007    Priority: Medium  . Osteoarthritis 07/22/2014    Priority: Low  . History of colonic polyps 07/22/2014   Priority: Low  . Urinary frequency 09/11/2011    Priority: Low  . LOW BACK PAIN SYNDROME 09/24/2009    Priority: Low  . Dry eyes 09/30/2007    Priority: Low  . Allergic rhinitis 09/30/2007    Priority: Low  . History of shingles 09/30/2007    Priority: Low  . Hemorrhoid 10/30/2014   Past Surgical History:  Procedure Laterality Date  . COLONOSCOPY  2013    hx polyps   . [place]   right-crushed all 4 toes reportedly  . KNEE ARTHROSCOPY  2011   Right for Condromalacia  . ORIF FEMUR FRACTURE  1993   Right  . WISDOM TOOTH EXTRACTION      Family History  Problem Relation Age of Onset  . Diabetes type II Mother   . Heart disease Mother        CABG-died during this, smoker  . Hyperlipidemia Mother   . Hypertension Mother   . Diabetes Father   . Heart disease Father        thought death due to this, smoker  . Hypertension Father   . Schizophrenia Father   . Colon polyps Sister   . Colon cancer Neg Hx   . Stomach cancer Neg Hx   . Esophageal cancer Neg Hx   . Rectal cancer Neg Hx     Medications- reviewed and updated Current  Outpatient Medications  Medication Sig Dispense Refill  . amLODipine (NORVASC) 10 MG tablet Take 1 tablet (10 mg total) by mouth daily. 90 tablet 3  . meloxicam (MOBIC) 7.5 MG tablet TAKE 1 TABLET BY MOUTH EVERY DAY 90 tablet 0  . metoprolol succinate (TOPROL-XL) 50 MG 24 hr tablet TAKE 2 TABLETS BY MOUTH EVERY DAY WITH OR IMMEDIATELY FOLLOWING A MEAL 180 tablet 1  . Multiple Vitamin (MULTI VITAMIN PO) Take by mouth.    . pramoxine-hydrocortisone (ANALPRAM-HC) 1-1 % rectal cream Place 1 application rectally 2 (two) times daily. 30 g 0  . SUMAtriptan (IMITREX) 50 MG tablet TAKE 1 TABLET BY MOUTH AS NEEDED MIGRAINE 6 tablet 10  . temazepam (RESTORIL) 15 MG capsule Take 1 capsule (15 mg total) by mouth at bedtime as needed. for sleep 30 capsule 5  . triamcinolone cream (KENALOG) 0.5 % Apply 1 application topically 2 (two) times daily.  For 10 days to affected area (Patient taking differently: Apply 1 application topically as needed. For 10 days to affected area) 30 g 0  . OVER THE COUNTER MEDICATION Take 1,000 mg by mouth daily. Tumeric     No current facility-administered medications for this visit.     Allergies-reviewed and updated Allergies  Allergen Reactions  . Codeine Nausea Only    Social History   Social History Narrative   Married-'81. 1 dtr - '87, 1 son - '83, 2 granddaughters   Marriage in good health.       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       Hobbies: time with grandkids, work with cows, minimal travel due to pain   Objective  Objective:  BP 130/74   Pulse 68   Temp 97.6 F (36.4 C) (Temporal)   Ht 5\' 6"  (1.676 m)   Wt 183 lb 3.2 oz (83.1 kg)   SpO2 97%   BMI 29.57 kg/m  Gen: NAD, resting comfortably HEENT: Mask not removed due to covid 19. TM normal. Bridge of nose normal. Eyelids normal.  Neck: no thyromegaly or cervical lymphadenopathy  CV: RRR no murmurs rubs or gallops Lungs: CTAB no crackles, wheeze, rhonchi Abdomen: soft/nontender/nondistended/normal bowel sounds. No rebound or guarding.  Ext: no edema Skin: warm, dry Neuro: grossly normal, moves all extremities, PERRLA    Assessment and Plan  58 y.o. male presenting for annual physical.  Health Maintenance counseling: 1. Anticipatory guidance: Patient counseled regarding regular dental exams q6 months, eye exams yearly,  avoiding smoking and second hand smoke , limiting alcohol to 2 beverages per day- will drink 3-4 a day on weekends, 2 a day on weekdays. We discussed total goal 14 or less per week.  2. Risk factor reduction:  Advised patient of need for regular exercise and diet rich and fruits and vegetables to reduce risk of heart attack and stroke. Exercise- walks a lot at work 5-6 miles a day. Diet-tries to have  vegetables in diet at much as possible. His long term personal goal is in 160s. We discussed by next year working on even just 5-10 lbs.  Wt Readings from Last 3 Encounters:  03/19/19 183 lb 3.2 oz (83.1 kg)  12/18/18 181 lb (82.1 kg)  07/11/18 182 lb 9.6 oz (82.8 kg)  3. Immunizations/screenings/ancillary studies-flu shot will get today  . Declines shingrix- likely next visit will do  Immunization History  Administered Date(s) Administered  .  Influenza,inj,Quad PF,6+ Mos 02/17/2016, 03/21/2017, 03/01/2018  . Influenza,inj,quad, With Preservative 02/28/2018  . Influenza-Unspecified 03/29/2014, 02/28/2018  . Tdap 11/12/2013  4. Prostate cancer screening- low risk prior PSA trend-we will trend today. Defer rectal given low risk trend.  Lab Results  Component Value Date   PSA 0.21 01/04/2018   PSA 0.30 11/02/2011   PSA 0.25 09/21/2009   5. Colon cancer screening - hyperplastic polyp 01/04/16. adenoma 2013- repeat 2022 6. Skin cancer screening-Dr. Ronnald Ramp yearly in general.advised regular sunscreen use- does try to cover up. Denies worrisome, changing, or new skin lesions.  7.  Former smoker- quit in 2015-we will get urinalysis. He does use smokeless tobacco- encouraged cessation- has cut usage in half over last 3 months.   Needs AAA scan at 27.  Does not qualify for lung cancer screening  Status of chronic or acute concerns   #Hypertension S: Compliant with amlodipine 10 mg, metoprolol 100 mg extended release A/P: Stable. Continue current medications.     #Hyperlipidemia S:10 year ascvd risk score was 9.8% using last years lipid #s and this year age.   A/P: we discussed with family history CAD and risk above 7.5% could strongly consider statin- he prefers to focus on diet/exercise and recheck next year- strongly prefers not to add another medicine- he is concerned about statin side effects.    - could consider coronary CT- discussed broaching this again next year  #Insomnia S: Compliant  with temazepam 15 mg. Reasonable control- but still not perfect. May have 2 good nights then a rough 3rd night. Some of this is related to being on 3rd shift.  A/P: Stable. Continue current medications.  -wrote letter in support of getting off 3rd shift if possible to help alleviate issue  #Migraines S: Compliant with sumatriptan-uses2-3x a year  A/P: doing well- continue sparing use   #Polycythemia-former smoker-has not smoked since 2015 but does get a lot of smoke exposure at work. Update CBC. He will check with his wife about snoring- if persists could consider sleep apnea evaluation Lab Results  Component Value Date   WBC 6.4 07/11/2018   HGB 17.7 (H) 07/11/2018   HCT 50.4 07/11/2018   MCV 93.1 07/11/2018   PLT 160.0 07/11/2018   #Osteoarthritis- Left knee. celebrex in past now meloxicam more helpful. Needs q6 month BMP. Mostly refilled through Dr. Delilah Shan but Ill refill if needed  #Allergy to beef- even with recent tick bite and has been able to eat beef recently without issues.  # Joint pain - right shoulder pain from sleeping on it. Mild left hip pain/more in groin. Hip pain can get up to 7/10 but just for a few seconds . Does not want to work up further at present   # some ED issues- trial low dose sildenafil starting 20mg   Recommended follow up: 39-month follow-up suggested  Lab/Order associations: fasting   ICD-10-CM   1. Preventative health care  Z00.00 CBC with Differential/Platelet    Comprehensive metabolic panel    Lipid panel    PSA    POCT Urinalysis Dipstick (Automated)    POCT Urinalysis Dipstick (Automated)  2. Essential hypertension  I10 CBC with Differential/Platelet    Comprehensive metabolic panel    Lipid panel  3. Migraine with aura and without status migrainosus, not intractable  G43.109   4. Insomnia, unspecified type  G47.00   5. Allergy to beef  Z91.018   6. Former smoker  Z87.891 POCT Urinalysis Dipstick (Automated)    POCT Urinalysis Dipstick  (  Automated)  7. Screening for prostate cancer  Z12.5 PSA    Meds ordered this encounter  Medications  . amLODipine (NORVASC) 10 MG tablet    Sig: Take 1 tablet (10 mg total) by mouth daily.    Dispense:  90 tablet    Refill:  3  . temazepam (RESTORIL) 15 MG capsule    Sig: Take 1 capsule (15 mg total) by mouth at bedtime as needed. for sleep    Dispense:  30 capsule    Refill:  5    This request is for a new prescription for a controlled substance as required by Federal/State law..    Return precautions advised.  [name], MD

## 2019-07-27 ENCOUNTER — Encounter (HOSPITAL_COMMUNITY): Payer: Self-pay | Admitting: Family Medicine

## 2019-07-27 ENCOUNTER — Ambulatory Visit (HOSPITAL_COMMUNITY)
Admission: EM | Admit: 2019-07-27 | Discharge: 2019-07-27 | Disposition: A | Discharge status: Home / Self Care | Payer: 59 | Attending: Family Medicine | Admitting: Family Medicine

## 2019-07-27 ENCOUNTER — Other Ambulatory Visit: Payer: Self-pay

## 2019-07-27 DIAGNOSIS — M436 Torticollis: Secondary | ICD-10-CM

## 2019-07-27 MED ORDER — PREDNISONE 50 MG PO TABS: ORAL_TABLET | ORAL | 0 refills | Status: DC

## 2019-07-27 MED ORDER — OXYCODONE-ACETAMINOPHEN 5-325 MG PO TABS: 1.0000 | ORAL_TABLET | ORAL | 0 refills | Status: DC | PRN

## 2019-07-27 NOTE — ED Triage Notes (Signed)
Pt is here with neck pain that started Tuesday, pt has taken Robaxin & some pain pills of his wife's meds.

## 2019-07-27 NOTE — ED Provider Notes (Signed)
[facility]    CSN: [id number] Arrival date & time: 07/27/19  1057      History   Chief Complaint Chief Complaint  Patient presents with  . Neck Pain    HPI Jordan Cantrell is a 59 y.o. male.   Initial [facility] visit for this 59 yo married man.  Pt is here with neck pain that started Tuesday, pt has taken Robaxin & some pain pills of his wife's meds.  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  His problem began about a week ago after his head and ligament bumpy tractor spurting maneuver.  The pain began on the left posterior neck and gradually his gotten worse and spread a little bit to the right.  He has no numbness or weakness in his arms.  The pain is becoming unbearable.  Patient said no fever.  He has not had this problem before.       Past Medical History:  Diagnosis Date  .       Marland Kitchen Arthritis    knees, hands  . Balanoposthitis    Mild  .       .       . Dry eye syndrome   . Hearing loss    slight - bilateral - no hearing aids  .           Marland Kitchen Hypertension   . Migraine, unspecified, without mention of intractable migraine without mention of status migrainosus   . TINNITUS NOS 01/31/2008   Qualifier: Diagnosis of  By: Plotnikov MD, Evie Lacks     Patient Active Problem List   Diagnosis Date Noted  . Allergy to beef 02/17/2016  . Hemorrhoid 10/30/2014  . Osteoarthritis 07/22/2014  . History of colonic polyps 07/22/2014  . Insomnia 11/12/2012  . Hypertension 11/12/2012  . Tobacco abuse 11/02/2011  . Urinary frequency 09/11/2011  . LOW BACK PAIN SYNDROME 09/24/2009  . Migraine 09/30/2007  . Dry eyes 09/30/2007  . Allergic rhinitis 09/30/2007  . History of shingles 09/30/2007    Past Surgical History:  Procedure Laterality Date  . COLONOSCOPY  2013    hx polyps   . [place]   right-crushed all 4 toes reportedly  . KNEE ARTHROSCOPY  2011   Right  for Condromalacia  . ORIF FEMUR FRACTURE  1993   Right  . WISDOM TOOTH EXTRACTION         Home Medications    Prior to Admission medications   Medication Sig Start Date End Date Taking? Authorizing Provider  amLODipine (NORVASC) 10 MG tablet Take 1 tablet (10 mg total) by mouth daily. 03/19/19   Marin Olp, MD  metoprolol succinate (TOPROL-XL) 50 MG 24 hr tablet TAKE 2 TABLETS BY MOUTH EVERY DAY WITH OR IMMEDIATELY FOLLOWING A MEAL 03/17/19   Marin Olp, MD  Multiple Vitamin (MULTI VITAMIN PO) Take by mouth.    [provider]  OVER THE COUNTER MEDICATION Take 1,000 mg by mouth daily. Tumeric    [provider]  oxyCODONE-acetaminophen (PERCOCET/ROXICET) 5-325 MG tablet Take 1-2 tablets by mouth every 4 (four) hours as needed for severe pain. 07/27/19   Robyn Haber, MD  pramoxine-hydrocortisone [facility]) 1-1 % rectal cream Place 1 application rectally 2 (two) times daily. 05/27/18   Marin Olp, MD  predniSONE (DELTASONE) 50 MG tablet One daily with food 07/27/19   Noma Quijas,  Synetta Shadow, MD  sildenafil (REVATIO) 20 MG tablet Take 1-5 tablets as needed once every 48 hours for erectile dysfunction 03/19/19   Marin Olp, MD  SUMAtriptan (IMITREX) 50 MG tablet TAKE 1 TABLET BY MOUTH AS NEEDED MIGRAINE 06/29/18   Marin Olp, MD  temazepam (RESTORIL) 15 MG capsule Take 1 capsule (15 mg total) by mouth at bedtime as needed. for sleep 03/19/19   Marin Olp, MD  triamcinolone cream (KENALOG) 0.1 % APPLY TO AFFECTED AREA TWICE A DAY 06/04/19   [provider]  triamcinolone cream (KENALOG) 0.5 % Apply 1 application topically 2 (two) times daily. For 10 days to affected area Patient taking differently: Apply 1 application topically as needed. For 10 days to affected area 10/09/17   Marin Olp, MD    Family History Family History  Problem Relation Age of Onset  . Diabetes type II Mother   . Heart disease Mother        CABG-died  during this, smoker  . Hyperlipidemia Mother   . Hypertension Mother   . Diabetes Father   . Heart disease Father        thought death due to this, smoker  . Hypertension Father   . Schizophrenia Father   . Colon polyps Sister   . Colon cancer Neg Hx   . Stomach cancer Neg Hx   . Esophageal cancer Neg Hx   . Rectal cancer Neg Hx     Social History Social History   Tobacco Use  . Smoking status: Former Smoker    Packs/day: 0.10    Types: Cigarettes    Quit date: 04/28/2014    Years since quitting: 5.2  . Smokeless tobacco: Current User    Types: Snuff  Substance Use Topics  . Alcohol use: Yes    Alcohol/week: 10.0 - 12.0 standard drinks    Types: 10 - 12 Cans of beer per week    Comment: beer/liquor  . Drug use: No     Allergies   Codeine   Review of Systems Review of Systems  Musculoskeletal: Positive for neck pain.  All other systems reviewed and are negative.    Physical Exam Triage Vital Signs ED Triage Vitals  Enc Vitals Group     BP      Pulse      Resp      Temp      Temp src      SpO2      Weight      Height      Head Circumference      Peak Flow      Pain Score      Pain Loc      Pain Edu?      Excl. in [place]?    No data found.  Updated Vital Signs BP 139/80 (BP Location: Right Arm)   Pulse 80   Temp 99.1 F (37.3 C) (Oral)   Resp 18   Wt 81.6 kg   SpO2 97%   BMI 29.05 kg/m    Physical Exam Vitals and nursing note reviewed.  Constitutional:      Appearance: Normal appearance. He is normal weight.  HENT:     Head: Normocephalic.  Eyes:     Conjunctiva/sclera: Conjunctivae normal.  Neck:     Comments: Tender left posterior neck.  He has limited range of motion because of pain but he is able to move both equally about 30 degrees and look up about  20 degrees. Cardiovascular:     Rate and Rhythm: Normal rate.  Pulmonary:     Effort: Pulmonary effort is normal.  Musculoskeletal:        General: Tenderness present. No  deformity or signs of injury.     Cervical back: Tenderness present.  Skin:    General: Skin is warm and dry.  Neurological:     General: No focal deficit present.     Mental Status: He is alert.  Psychiatric:        Mood and Affect: Mood normal.      UC Treatments / Results  Labs (all labs ordered are listed, but only abnormal results are displayed) Labs Reviewed - No data to display  EKG   Radiology No results found.  Procedures Procedures (including critical care time)  Medications Ordered in UC Medications - No data to display  Initial Impression / Assessment and Plan / UC Course  I have reviewed the triage vital signs and the nursing notes.  Pertinent labs & imaging results that were available during my care of the patient were reviewed by me and considered in my medical decision making (see chart for details).    Final Clinical Impressions(s) / UC Diagnoses   Final diagnoses:  Torticollis, acute     Discharge Instructions     Use the rolled up towel as shown to help support the neck and ease the range of motion of neck movement.    ED Prescriptions    Medication Sig Dispense Auth. Provider   oxyCODONE-acetaminophen (PERCOCET/ROXICET) 5-325 MG tablet Take 1-2 tablets by mouth every 4 (four) hours as needed for severe pain. 12 tablet Robyn Haber, MD   predniSONE (DELTASONE) 50 MG tablet One daily with food 5 tablet Robyn Haber, MD     I have reviewed the PDMP during this encounter.   Robyn Haber, MD 07/27/19 1230

## 2019-07-27 NOTE — Discharge Instructions (Addendum)
Use the rolled up towel as shown to help support the neck and ease the range of motion of neck movement.

## 2019-07-28 ENCOUNTER — Ambulatory Visit (INDEPENDENT_AMBULATORY_CARE_PROVIDER_SITE_OTHER): Payer: 59 | Admitting: Family Medicine

## 2019-07-28 ENCOUNTER — Encounter: Payer: Self-pay | Admitting: Family Medicine

## 2019-07-28 VITALS — BP 120/46 | HR 73 | Temp 97.6°F | Ht 66.0 in | Wt 185.2 lb

## 2019-07-28 DIAGNOSIS — M8949 Other hypertrophic osteoarthropathy, multiple sites: Secondary | ICD-10-CM | POA: Diagnosis not present

## 2019-07-28 DIAGNOSIS — M159 Polyosteoarthritis, unspecified: Secondary | ICD-10-CM

## 2019-07-28 DIAGNOSIS — G4700 Insomnia, unspecified: Secondary | ICD-10-CM

## 2019-07-28 DIAGNOSIS — M436 Torticollis: Secondary | ICD-10-CM

## 2019-07-28 DIAGNOSIS — I10 Essential (primary) hypertension: Secondary | ICD-10-CM | POA: Diagnosis not present

## 2019-07-28 DIAGNOSIS — R7989 Other specified abnormal findings of blood chemistry: Secondary | ICD-10-CM | POA: Diagnosis not present

## 2019-07-28 NOTE — Progress Notes (Signed)
Phone [phone] In person visit   Subjective:   Jordan Cantrell is a 59 y.o. year old very pleasant male patient who presents for/with See problem oriented charting Chief Complaint  Patient presents with  . Neck Pain   This visit occurred during the SARS-CoV-2 public health emergency.  Safety protocols were in place, including screening questions prior to the visit, additional usage of staff PPE, and extensive cleaning of exam room while observing appropriate contact time as indicated for disinfecting solutions.   Past Medical History-  Patient Active Problem List   Diagnosis Date Noted  . Allergy to beef 02/17/2016    Priority: High  . Tobacco abuse 11/02/2011    Priority: High  . Insomnia 11/12/2012    Priority: Medium  . Hypertension 11/12/2012    Priority: Medium  . Migraine 09/30/2007    Priority: Medium  . Osteoarthritis 07/22/2014    Priority: Low  . History of colonic polyps 07/22/2014    Priority: Low  . Urinary frequency 09/11/2011    Priority: Low  . LOW BACK PAIN SYNDROME 09/24/2009    Priority: Low  . Dry eyes 09/30/2007    Priority: Low  . Allergic rhinitis 09/30/2007    Priority: Low  . History of shingles 09/30/2007    Priority: Low  . Hemorrhoid 10/30/2014    Medications- reviewed and updated Current Outpatient Medications  Medication Sig Dispense Refill  . amLODipine (NORVASC) 10 MG tablet Take 1 tablet (10 mg total) by mouth daily. 90 tablet 3  . metoprolol succinate (TOPROL-XL) 50 MG 24 hr tablet TAKE 2 TABLETS BY MOUTH EVERY DAY WITH OR IMMEDIATELY FOLLOWING A MEAL 180 tablet 1  . Multiple Vitamin (MULTI VITAMIN PO) Take by mouth.    Marland Kitchen OVER THE COUNTER MEDICATION Take 1,000 mg by mouth daily. Tumeric    . oxyCODONE-acetaminophen (PERCOCET/ROXICET) 5-325 MG tablet Take 1-2 tablets by mouth every 4 (four) hours as needed for severe pain. 12 tablet 0  . pramoxine-hydrocortisone (ANALPRAM-HC) 1-1 % rectal cream Place 1 application rectally 2  (two) times daily. 30 g 0  . predniSONE (DELTASONE) 50 MG tablet One daily with food 5 tablet 0  . sildenafil (REVATIO) 20 MG tablet Take 1-5 tablets as needed once every 48 hours for erectile dysfunction 25 tablet 3  . SUMAtriptan (IMITREX) 50 MG tablet TAKE 1 TABLET BY MOUTH AS NEEDED MIGRAINE 6 tablet 10  . temazepam (RESTORIL) 15 MG capsule Take 1 capsule (15 mg total) by mouth at bedtime as needed. for sleep 30 capsule 5  . triamcinolone cream (KENALOG) 0.5 % Apply 1 application topically 2 (two) times daily. For 10 days to affected area (Patient taking differently: Apply 1 application topically as needed. For 10 days to affected area) 30 g 0   No current facility-administered medications for this visit.     Objective:  BP (!) 120/46   Pulse 73   Temp 97.6 F (36.4 C) (Temporal)   Ht 5\' 6"  (1.676 m)   Wt 185 lb 3.2 oz (84 kg)   SpO2 94%   BMI 29.89 kg/m  Gen: NAD, resting comfortably CV: RRR no murmurs rubs or gallops Lungs: CTAB no crackles, wheeze, rhonchi Ext: no edema Skin: warm, dry Neuro: grossly normal, moves all extremities, moves upper extremities without difficulty MSK: Very tight and painful to palpation in lateral neck-restricted range of motion noted-turns head about 15 or 20 degrees in each direction before reporting significant worsening of pain     Assessment and Plan  #  Neck pain  S: 59 year old male who presented to urgent care yesterday with severe neck pain.  Symptoms have begun about a week prior after working on his tractor which was very bumpy-pain started in left posterior neck and gradually worsened and spread to the right side.  No numbness/weakness into the arms.  Patient tried Robaxin and some pain pill that was at home.  Patient diagnosed with torticollis and sent home with 5-day course of prednisone as well as short course of oxycodone. Prior to that he had worked with a pole saw about a week prior and noted some mild achiness in the neck/crick in  the neck.   Pain 9-10 at its worst. No radiation of pain into arms.  Pain today is 0/10 sitting with medications (has been taking oxycodone as needed but didn't take due to traveling). With any movements pain increases from 0/10 up to 2-3/10. He is having limited mobility with movement of head and neck. He tried Flexeril, robaxin and ibuprofen with no improvement.   Urgent care wrote him a note to be out through Wednesday- he would have to go back in Tuesday night- he does not feel at all prepared to do that A/P: I agree with diagnosis of torticollis from urgent care -pain primarily in the musculature of lateral neck-no midline pain noted.  No signs of radiculopathy.  Patient still in rather severe pain and requiring narcotics-not safe to return to work at this time-we are going to continue his course of prednisone and pain medication and see if he can return to work next week.  If fails to improve in this timeframe would recommend sports medicine or orthopedic evaluation (has seen Dr. Delilah Shan in the past) and consideration of cervical spine films-would also consider if had new or worsening symptoms such as radiculopathy -Encouraged avoiding overexerting himself-for that reason I have asked for him to be out of work through the 23rd-if he were to have significant improvement in symptoms before then could let us know  #Hypertension S: Compliant with amlodipine 10 mg, metoprolol 100 mg extended release A/P: Excellent control despite increased pain-continue current medication   #Insomnia S: Compliant with temazepam 15 mg on work nights-having to work right now also not having to use this A/P: I told patient I agreed with remaining off of temazepam since he is on oxycodone at present  #Polycythemia-former smoker-has not smoked since 2015 but does get a lot of smoke exposure at work.  Normal on last check "we will update CBC  #Osteoarthritis- Left knee. celebrex in past now meloxicam more helpful. Needs  q6 month BMP. Off meloxicam 3 weeks- low back has been better-noted it was better on a day that he missed the meloxicam so he wanted to continue to trial off of it as he was worried it could have been his kidneys being affected and asked for his kidneys to be checked today.  Arthritis in low back appears to be improved without NSAIDs recently.  Could also have arthritis in cervical spine-prednisone hopefully helpful  #Elevated LFTs-patient has had elevated liver function test at last visit-discussed last visit weight loss healthy eating and alcohol avoidance.  We will go ahead and recheck today's since checking kidney function anyway per patient request Lab Results  Component Value Date   ALT 63 (H) 03/19/2019   AST 38 (H) 03/19/2019   ALKPHOS 53 03/19/2019   BILITOT 0.8 03/19/2019   Recommended follow up: Return for as needed for new or worsening symptoms.  Lab/Order  associations:   ICD-10-CM   1. Torticollis  M43.6   2. Essential hypertension  I10 CBC with Differential/Platelet    Comprehensive metabolic panel  3. Insomnia, unspecified type  G47.00   4. Primary osteoarthritis involving multiple joints  M89.49   5. Elevated LFTs  R79.89 Comprehensive metabolic panel   Time Spent: 27 minutes of total time (3:50 PM- 4:17 PM) was spent on the date of the encounter performing the following actions: chart review prior to seeing the patient, obtaining history, performing a medically necessary exam, counseling on the treatment plan, placing orders, and documenting in our EHR.   Return precautions advised.  [name], MD

## 2019-07-28 NOTE — Patient Instructions (Addendum)
I am glad you are improving- I think with continued relative rest you will continue to improve.   If you are unable to return work on 08/05/2019 then we need to see you back and determine next steps such as sports medicine referral or orthopedics referral. Or if you are not continuing to slowly improve please let us know or if symptoms worsen or if have issues like weakness in the arms.   Recommended follow up: Return for as needed for new or worsening symptoms.

## 2019-07-29 LAB — COMPREHENSIVE METABOLIC PANEL
ALT: 26 U/L (ref 0–53)
AST: 16 U/L (ref 0–37)
Albumin: 4.3 g/dL (ref 3.5–5.2)
Alkaline Phosphatase: 60 U/L (ref 39–117)
BUN: 14 mg/dL (ref 6–23)
CO2: 28 mEq/L (ref 19–32)
Calcium: 9.6 mg/dL (ref 8.4–10.5)
Chloride: 102 mEq/L (ref 96–112)
Creatinine, Ser: 0.99 mg/dL (ref 0.40–1.50)
GFR: 77.16 mL/min (ref >60.00)
Glucose, Bld: 128 mg/dL — ABNORMAL HIGH (ref 70–99)
Potassium: 4.4 mEq/L (ref 3.5–5.1)
Sodium: 137 mEq/L (ref 135–145)
Total Bilirubin: 0.4 mg/dL (ref 0.2–1.2)
Total Protein: 6.9 g/dL (ref 6.0–8.3)

## 2019-07-29 LAB — CBC WITH DIFFERENTIAL/PLATELET
Basophils Absolute: 0.1 10*3/uL (ref 0.0–0.1)
Basophils Relative: 0.5 % (ref 0.0–3.0)
Eosinophils Absolute: 0.1 10*3/uL (ref 0.0–0.7)
Eosinophils Relative: 0.7 % (ref 0.0–5.0)
HCT: 46.6 % (ref 39.0–52.0)
Hemoglobin: 15.9 g/dL (ref 13.0–17.0)
Lymphocytes Relative: 11.6 % — ABNORMAL LOW (ref 12.0–46.0)
Lymphs Abs: 1.4 10*3/uL (ref 0.7–4.0)
MCHC: 34.2 g/dL (ref 30.0–36.0)
MCV: 94.3 fl (ref 78.0–100.0)
Monocytes Absolute: 1 10*3/uL (ref 0.1–1.0)
Monocytes Relative: 8.4 % (ref 3.0–12.0)
Neutro Abs: 9.8 10*3/uL — ABNORMAL HIGH (ref 1.4–7.7)
Neutrophils Relative %: 78.8 % — ABNORMAL HIGH (ref 43.0–77.0)
Platelets: 172 10*3/uL (ref 150.0–400.0)
RBC: 4.94 Mil/uL (ref 4.22–5.81)
RDW: 12.6 % (ref 11.5–15.5)
WBC: 12.4 10*3/uL — ABNORMAL HIGH (ref 4.0–10.5)

## 2019-08-20 ENCOUNTER — Telehealth: Payer: Self-pay | Admitting: Family Medicine

## 2019-08-20 NOTE — Telephone Encounter (Signed)
I definitely wrote patient a note to be out of work-I feel like we did receive some FMLA paperwork but if we did I would have submitted that day-I do not have any paperwork left to complete in my office.  They may have to resubmit this to [place]

## 2019-08-20 NOTE — Telephone Encounter (Signed)
Patient is calling in saying he brought in FMLA paperwork on his appointment 3/15 and states his job has yet to receive it, asked for a returned call for an update.

## 2019-08-20 NOTE — Telephone Encounter (Signed)
I have looked in chart do not see do you remember this?

## 2019-08-20 NOTE — Telephone Encounter (Signed)
Update on this-I did receive a limited weight continuation plan paperwork today-Joellen looks someone just placed this in my basket

## 2019-08-21 ENCOUNTER — Encounter: Payer: Self-pay | Admitting: Family Medicine

## 2019-08-21 NOTE — Telephone Encounter (Signed)
See phone note have faxed and send copy to patient

## 2019-08-21 NOTE — Telephone Encounter (Signed)
Called patient let know faxing now.per request I have put copy in mail for his records.

## 2019-09-19 ENCOUNTER — Ambulatory Visit: Payer: 59 | Admitting: Family Medicine

## 2019-09-22 ENCOUNTER — Ambulatory Visit: Payer: 59 | Admitting: Family Medicine

## 2019-09-24 ENCOUNTER — Encounter: Payer: Self-pay | Admitting: Family Medicine

## 2019-09-24 ENCOUNTER — Ambulatory Visit (INDEPENDENT_AMBULATORY_CARE_PROVIDER_SITE_OTHER): Payer: 59 | Admitting: Family Medicine

## 2019-09-24 ENCOUNTER — Other Ambulatory Visit: Payer: Self-pay

## 2019-09-24 VITALS — BP 140/80 | HR 82 | Temp 97.9°F | Ht 66.0 in | Wt 182.5 lb

## 2019-09-24 DIAGNOSIS — I10 Essential (primary) hypertension: Secondary | ICD-10-CM | POA: Diagnosis not present

## 2019-09-24 DIAGNOSIS — G4700 Insomnia, unspecified: Secondary | ICD-10-CM

## 2019-09-24 DIAGNOSIS — Z91014 Allergy to mammalian meats: Secondary | ICD-10-CM

## 2019-09-24 DIAGNOSIS — R21 Rash and other nonspecific skin eruption: Secondary | ICD-10-CM | POA: Diagnosis not present

## 2019-09-24 DIAGNOSIS — Z91018 Allergy to other foods: Secondary | ICD-10-CM | POA: Diagnosis not present

## 2019-09-24 LAB — CBC WITH DIFFERENTIAL/PLATELET
Basophils Absolute: 0 10*3/uL (ref 0.0–0.1)
Basophils Relative: 0.4 % (ref 0.0–3.0)
Eosinophils Absolute: 0.2 10*3/uL (ref 0.0–0.7)
Eosinophils Relative: 2.8 % (ref 0.0–5.0)
HCT: 50.2 % (ref 39.0–52.0)
Hemoglobin: 17.4 g/dL — ABNORMAL HIGH (ref 13.0–17.0)
Lymphocytes Relative: 25.5 % (ref 12.0–46.0)
Lymphs Abs: 1.9 10*3/uL (ref 0.7–4.0)
MCHC: 34.7 g/dL (ref 30.0–36.0)
MCV: 94.4 fl (ref 78.0–100.0)
Monocytes Absolute: 0.9 10*3/uL (ref 0.1–1.0)
Monocytes Relative: 12.2 % — ABNORMAL HIGH (ref 3.0–12.0)
Neutro Abs: 4.4 10*3/uL (ref 1.4–7.7)
Neutrophils Relative %: 59.1 % (ref 43.0–77.0)
Platelets: 154 10*3/uL (ref 150.0–400.0)
RBC: 5.32 Mil/uL (ref 4.22–5.81)
RDW: 13.3 % (ref 11.5–15.5)
WBC: 7.5 10*3/uL (ref 4.0–10.5)

## 2019-09-24 NOTE — Progress Notes (Signed)
Phone [phone] In person visit     I,[name],acting as a scribe for Jordan Reddish, MD.,have documented all relevant documentation on the behalf of Jordan Reddish, MD,as directed by  Jordan Reddish, MD while in the presence of Jordan Reddish, MD.  Subjective:   Jordan Cantrell is a 59 y.o. year old very pleasant male patient who presents for/with See problem oriented charting Chief Complaint  Patient presents with  . Rash   This visit occurred during the SARS-CoV-2 public health emergency.  Safety protocols were in place, including screening questions prior to the visit, additional usage of staff PPE, and extensive cleaning of exam room while observing appropriate contact time as indicated for disinfecting solutions.   Past Medical History-  Patient Active Problem List   Diagnosis Date Noted  . Allergy to beef 02/17/2016    Priority: High  . Tobacco abuse 11/02/2011    Priority: High  . Insomnia 11/12/2012    Priority: Medium  . Hypertension 11/12/2012    Priority: Medium  . Migraine 09/30/2007    Priority: Medium  . Osteoarthritis 07/22/2014    Priority: Low  . History of colonic polyps 07/22/2014    Priority: Low  . Urinary frequency 09/11/2011    Priority: Low  . LOW BACK PAIN SYNDROME 09/24/2009    Priority: Low  . Dry eyes 09/30/2007    Priority: Low  . Allergic rhinitis 09/30/2007    Priority: Low  . History of shingles 09/30/2007    Priority: Low  . Hemorrhoid 10/30/2014    Medications- reviewed and updated Current Outpatient Medications  Medication Sig Dispense Refill  . amLODipine (NORVASC) 10 MG tablet Take 1 tablet (10 mg total) by mouth daily. 90 tablet 3  . diphenhydrAMINE (BENADRYL) 25 MG tablet Take 25 mg by mouth every 6 (six) hours as needed for itching.    . metoprolol succinate (TOPROL-XL) 50 MG 24 hr tablet TAKE 2 TABLETS BY MOUTH EVERY DAY WITH OR IMMEDIATELY FOLLOWING A MEAL 180 tablet 1  . Multiple Vitamin (MULTI VITAMIN PO) Take  by mouth.    Marland Kitchen OVER THE COUNTER MEDICATION Take 1,000 mg by mouth daily. Tumeric    . pramoxine-hydrocortisone (ANALPRAM-HC) 1-1 % rectal cream Place 1 application rectally 2 (two) times daily. 30 g 0  . sildenafil (REVATIO) 20 MG tablet Take 1-5 tablets as needed once every 48 hours for erectile dysfunction 25 tablet 3  . SUMAtriptan (IMITREX) 50 MG tablet TAKE 1 TABLET BY MOUTH AS NEEDED MIGRAINE 6 tablet 10  . temazepam (RESTORIL) 15 MG capsule Take 1 capsule (15 mg total) by mouth at bedtime as needed. for sleep 30 capsule 5  . triamcinolone cream (KENALOG) 0.5 % Apply 1 application topically 2 (two) times daily. For 10 days to affected area (Patient taking differently: Apply 1 application topically as needed. For 10 days to affected area) 30 g 0   No current facility-administered medications for this visit.     Objective:  BP 140/80 (BP Location: Left Arm, Patient Position: Sitting, Cuff Size: Large)   Pulse 82   Temp 97.9 F (36.6 C) (Temporal)   Ht 5\' 6"  (1.676 m)   Wt 182 lb 8 oz (82.8 kg)   SpO2 94%   BMI 29.46 kg/m  Gen: NAD, resting comfortably CV: RRR no murmurs rubs or gallops Lungs: CTAB no crackles, wheeze, rhonchi Ext: no edema, middle finger-some mild tenderness with palpation at DIP and PIP joint Skin: warm, dry, erythematous papular rash through mid to upper  back and on the shoulders.  Similar less numerous rash on bilateral lower arms.  Also has some erythema and scaling with papules on the scrotum.  Almost circular patch of erythema in left axilla       Assessment and Plan   #Rash on back S:Pt c/o red itchy rash on back started 6 months ago. He went to dermatologist and was given Triamcinolone  0.1% cream to use has not helped get rid of it but does help with the itch. Also has rash on hands off and on for the past 6 months.   Has rash on testicles as well and is itching, tried OTC Clotrimazole 1% cream little relie- last Thursday or Friday- has red bumps and  some flaking of skin on scrotum. Now he has a rash under left axilla which started on Sunday. He has changed shampoos, laundry detergents nothing has helped.  When he was on prednisone for torticollis he is not sure if symptoms improved or not.   He is concerned that this could be alpha gal.   Wife without similar rash less likely scabies. Benadryl helps  ROS-not ill appearing (tired but also worked 7 days straight for a month), no fever/chills. No new medications. Not immunocompromised. No mucus membrane involvement.  A/P: From [facility] "  Patient Instructions  Rash with unclear cause for 6 months now.  Has already seen dermatology and did not have significant relief with triamcinolone.  He also now has a similar rash in his groin which he is getting mild improvement with Lotrimin is all-he will continue course.  Also has almost circular rash in left axilla-he can try clotrimazole there.  -He has been eating beef and he does have a history of alpha gal-we are going to check his levels and if these are elevated likely stop beef -If symptoms fail to improve we could consider a course of treatment for scabies. -If that is not effective I would definitely recommend dermatology follow-up-since it can take some time to get into dermatology I encouraged him to go ahead and call to schedule this (could cancel if things get better) " -We discussed doing a biopsy of one of the lesions-for now he would like to defer this and consider doing this with dermatology -He would also like to be tested for Lyme disease-none of his rashes are typical target lesion  # Insomnia/fatigue S: with 3rd shift only able to sleep 5-6 hours most nights - most he has gotten in last year has been 7. Notes significant daytime fatigue related to poor sleep . On temazepam A/P: I wrote a letter of support of him coming off a third shift-he would like to try to come off temazepam in the long run but I think that will be very challenging  while he is still on third shift.  I also wonder if poor sleep/third shift could be affecting blood pressure  # torticollis follow up-patient diagnosed with torticollis at urgent care last month-he saw me for follow-up and I confirmed the diagnosis.  He was placed on a course of prednisone.  Had recommended orthopedic follow-up if not improving.  Fortunately patient reports-symptoms resolved -He did have an elevated white blood count but that was on prednisone-recheck CBC with labs today  #Elevated LFTs-normal last check thankfully  #Hypertension S: Compliant with amlodipine 10 mg, metoprolol 100 mg extended release BP Readings from Last 3 Encounters:  09/24/19 140/80  07/28/19 (!) 120/46  07/27/19 139/80  A/P: Slight poor control today despite  compliance with medications.  Third shift work could be contributing-I wrote a letter in support of him coming off third shift.   From [facility]:  " Blood pressure slightly high today at 140-lets have you do some home monitoring and let me know in 2 to 3 weeks-also update me with how the rash is doing at that time.  Our goal for you is to be less than 140/90 so 138/88 or less "   #Osteoarthritis of the fingers-no morning stiffness.  Recommended trial of Voltaren gel   Recommended follow up: As needed if symptoms fail to improve.  Also recommended physical November 2020-does not have to be a full 365 days as he has [place] healthcare   Lab/Order associations:   ICD-10-CM   1. Rash  R21 Alpha-Gal Panel    CBC with Differential/Platelet    B. burgdorfi antibodies  2. Allergy to beef  Z91.018 Alpha-Gal Panel    CBC with Differential/Platelet  3. Insomnia, unspecified type  G47.00   4. Essential hypertension  I10    The entirety of the information documented in the History of Present Illness, Review of Systems and Physical Exam were personally obtained by me. Portions of this information were initially documented by the CMA and reviewed by me for  thoroughness and accuracy.   Jordan Reddish, MD  Return precautions advised.  Jordan Reddish, MD

## 2019-09-24 NOTE — Patient Instructions (Addendum)
Rash with unclear cause for 6 months now.  Has already seen dermatology and did not have significant relief with triamcinolone.  He also now has a similar rash in his groin which he is getting mild improvement with Lotrimin is all-he will continue course.  Also has almost circular rash in left axilla-he can try clotrimazole there.  -He has been eating beef and he does have a history of alpha gal-we are going to check his levels and if these are elevated likely stop beef -If symptoms fail to improve we could consider a course of treatment for scabies. -If that is not effective I would definitely recommend dermatology follow-up-since it can take some time to get into dermatology I encouraged him to go ahead and call to schedule this (could cancel if things get better)  Could try voltaren gel for finger pain up to 4x a day  Blood pressure slightly high today at 140-lets have you do some home monitoring and let me know in 2 to 3 weeks-also update me with how the rash is doing at that time.  Our goal for you is to be less than 140/90 so 138/88 or less  Recommended follow up: As needed if symptoms fail to improve.  Also recommended physical November 2020-does not have to be a full 365 days as he has [place] [facility]

## 2019-09-29 ENCOUNTER — Encounter: Payer: Self-pay | Admitting: Family Medicine

## 2019-09-29 LAB — ALPHA-GAL PANEL
Beef IgE: <0.1 kU/L (ref <0.35)
Class: 0
Class: 0
Class: 0
Galactose-alpha-1,3-galactose IgE: 0.95 kU/L — ABNORMAL HIGH (ref <0.10)
LAMB/MUTTON IGE: <0.1 kU/L (ref <0.35)
Pork IgE: <0.1 kU/L (ref <0.35)

## 2019-09-29 LAB — B. BURGDORFI ANTIBODIES: B burgdorferi Ab IgG+IgM: <0.9 index

## 2019-09-30 ENCOUNTER — Other Ambulatory Visit: Payer: Self-pay

## 2019-09-30 DIAGNOSIS — R0683 Snoring: Secondary | ICD-10-CM

## 2019-11-09 ENCOUNTER — Other Ambulatory Visit: Payer: Self-pay | Admitting: Family Medicine

## 2020-02-09 ENCOUNTER — Encounter: Payer: Self-pay | Admitting: Family Medicine

## 2020-02-09 ENCOUNTER — Other Ambulatory Visit: Payer: 59

## 2020-02-09 DIAGNOSIS — Z20822 Contact with and (suspected) exposure to covid-19: Secondary | ICD-10-CM

## 2020-02-10 LAB — SARS-COV-2, NAA 2 DAY TAT

## 2020-02-10 LAB — NOVEL CORONAVIRUS, NAA: SARS-CoV-2, NAA: DETECTED — AB

## 2020-02-10 NOTE — Telephone Encounter (Signed)
Patient has already been tested on 02/09/20

## 2020-02-11 ENCOUNTER — Encounter: Payer: Self-pay | Admitting: Nurse Practitioner

## 2020-02-12 ENCOUNTER — Other Ambulatory Visit: Payer: Self-pay | Admitting: Nurse Practitioner

## 2020-02-12 ENCOUNTER — Ambulatory Visit (HOSPITAL_COMMUNITY)
Admission: RE | Admit: 2020-02-12 | Discharge: 2020-02-12 | Disposition: A | Discharge status: Home / Self Care | Payer: 59 | Source: Ambulatory Visit | Attending: Pulmonary Disease | Admitting: Pulmonary Disease

## 2020-02-12 ENCOUNTER — Telehealth: Payer: 59 | Admitting: Family Medicine

## 2020-02-12 DIAGNOSIS — Z720 Tobacco use: Secondary | ICD-10-CM

## 2020-02-12 DIAGNOSIS — R7989 Other specified abnormal findings of blood chemistry: Secondary | ICD-10-CM | POA: Diagnosis present

## 2020-02-12 DIAGNOSIS — I10 Essential (primary) hypertension: Secondary | ICD-10-CM

## 2020-02-12 DIAGNOSIS — U071 COVID-19: Secondary | ICD-10-CM | POA: Diagnosis present

## 2020-02-12 DIAGNOSIS — E785 Hyperlipidemia, unspecified: Secondary | ICD-10-CM

## 2020-02-12 MED ORDER — SODIUM CHLORIDE 0.9 % IV SOLN: INTRAVENOUS | Status: DC | PRN

## 2020-02-12 MED ORDER — FAMOTIDINE IN NACL 20-0.9 MG/50ML-% IV SOLN: 20.0000 mg | Freq: Once | INTRAVENOUS | Status: DC | PRN

## 2020-02-12 MED ORDER — DIPHENHYDRAMINE HCL 50 MG/ML IJ SOLN: 50.0000 mg | Freq: Once | INTRAMUSCULAR | Status: DC | PRN

## 2020-02-12 MED ORDER — ALBUTEROL SULFATE HFA 108 (90 BASE) MCG/ACT IN AERS: 2.0000 | INHALATION_SPRAY | Freq: Once | RESPIRATORY_TRACT | Status: DC | PRN

## 2020-02-12 MED ORDER — SODIUM CHLORIDE 0.9 % IV SOLN
1200.0000 mg | Freq: Once | INTRAVENOUS | Status: AC
  Administered 2020-02-12: 1200 mg via INTRAVENOUS

## 2020-02-12 MED ORDER — METHYLPREDNISOLONE SODIUM SUCC 125 MG IJ SOLR: 125.0000 mg | Freq: Once | INTRAMUSCULAR | Status: DC | PRN

## 2020-02-12 MED ORDER — EPINEPHRINE 0.3 MG/0.3ML IJ SOAJ: 0.3000 mg | Freq: Once | INTRAMUSCULAR | Status: DC | PRN

## 2020-02-12 NOTE — Progress Notes (Signed)
  Diagnosis: COVID-19  Physician: Asencion Noble, MD  Procedure: [facility] Med: casirivimab\imdevimab infusion - Provided patient with casirivimab\imdevimab fact sheet for patients, parents and caregivers prior to infusion.  Complications: No immediate complications noted.  Discharge: Discharged home   Jordan Cantrell 02/12/2020

## 2020-02-12 NOTE — Discharge Instructions (Signed)

## 2020-02-12 NOTE — Progress Notes (Signed)
I connected by phone with Jordan Cantrell on 02/12/2020 at 7:16 AM to discuss the potential use of a new treatment for mild to moderate COVID-19 viral infection in non-hospitalized patients.  This patient is a 59 y.o. male that meets the FDA criteria for Emergency Use Authorization of COVID monoclonal antibody casirivimab/imdevimab or bamlanivimab/eteseviamb.  Has a (+) direct SARS-CoV-2 viral test result  Has mild or moderate COVID-19   Is NOT hospitalized due to COVID-19  Is within 10 days of symptom onset  Has at least one of the high risk factor(s) for progression to severe COVID-19 and/or hospitalization as defined in EUA.  Specific high risk criteria : BMI > 25, Cardiovascular disease or hypertension, Chronic Lung Disease and Other high risk medical condition per CDC:  Smoker   I have spoken and communicated the following to the patient or parent/caregiver regarding COVID monoclonal antibody treatment:  1. FDA has authorized the emergency use for the treatment of mild to moderate COVID-19 in adults and pediatric patients with positive results of direct SARS-CoV-2 viral testing who are 9 years of age and older weighing at least 40 kg, and who are at high risk for progressing to severe COVID-19 and/or hospitalization.  2. The significant known and potential risks and benefits of COVID monoclonal antibody, and the extent to which such potential risks and benefits are unknown.  3. Information on available alternative treatments and the risks and benefits of those alternatives, including clinical trials.  4. Patients treated with COVID monoclonal antibody should continue to self-isolate and use infection control measures (e.g., wear mask, isolate, social distance, avoid sharing personal items, clean and disinfect "high touch" surfaces, and frequent handwashing) according to CDC guidelines.   5. The patient or parent/caregiver has the option to accept or refuse COVID monoclonal antibody  treatment.  After reviewing this information with the patient, the patient has agreed to receive one of the available covid 19 monoclonal antibodies and will be provided an appropriate fact sheet prior to infusion. Orma Render, NP 02/12/2020 7:16 AM

## 2020-03-04 ENCOUNTER — Encounter: Payer: Self-pay | Admitting: Family Medicine

## 2020-03-04 ENCOUNTER — Ambulatory Visit (INDEPENDENT_AMBULATORY_CARE_PROVIDER_SITE_OTHER): Payer: 59

## 2020-03-04 ENCOUNTER — Other Ambulatory Visit: Payer: Self-pay

## 2020-03-04 DIAGNOSIS — Z23 Encounter for immunization: Secondary | ICD-10-CM

## 2020-04-05 ENCOUNTER — Other Ambulatory Visit: Payer: Self-pay | Admitting: Family Medicine

## 2020-06-02 ENCOUNTER — Encounter: Payer: Self-pay | Admitting: Family Medicine

## 2020-09-12 ENCOUNTER — Other Ambulatory Visit: Payer: Self-pay | Admitting: Family Medicine

## 2020-09-29 NOTE — Patient Instructions (Addendum)
-  Patient reports doing pretty well on meloxicam recently.  We will refill at 7.5 mg.  We discussed trying Voltaren gel over-the-counter before he starts the new prescription though-it is possible to stop will be more effective and this also lowers his risk of stomach bleeding, stroke, heart attack. voltaren would not help with the back though  Please stop by lab before you go If you have mychart- we will send your results within 3 business days of [place] receiving them.  If you do not have mychart- we will call you about results within 5 business days of [place] receiving them.  *please also note that you will see labs on mychart as soon as they post. I will later go in and write notes on them- will say "notes from Dr. Yong Channel"  Recommended follow up: keep October visit but update most of your yearly labs today- likely recheck kidney function next visit if having to use meloxicam a lot

## 2020-09-29 NOTE — Progress Notes (Signed)
Phone [phone] In person visit   Subjective:   Jordan Cantrell is a 60 y.o. year old very pleasant male patient who presents for/with See problem oriented charting Chief Complaint  Patient presents with  . Labs Only    Patient is requesting labs to check alpha gal and also lime disease.   . Medication Follow Up    This visit occurred during the SARS-CoV-2 public health emergency.  Safety protocols were in place, including screening questions prior to the visit, additional usage of staff PPE, and extensive cleaning of exam room while observing appropriate contact time as indicated for disinfecting solutions.   Past Medical History-  Patient Active Problem List   Diagnosis Date Noted  . Allergy to beef 02/17/2016    Priority: High  . Tobacco abuse 11/02/2011    Priority: High  . Hyperlipidemia, unspecified 09/30/2020    Priority: Medium  . Insomnia 11/12/2012    Priority: Medium  . Hypertension 11/12/2012    Priority: Medium  . Migraine 09/30/2007    Priority: Medium  . Osteoarthritis 07/22/2014    Priority: Low  . History of colonic polyps 07/22/2014    Priority: Low  . Urinary frequency 09/11/2011    Priority: Low  . LOW BACK PAIN SYNDROME 09/24/2009    Priority: Low  . Dry eyes 09/30/2007    Priority: Low  . Allergic rhinitis 09/30/2007    Priority: Low  . History of shingles 09/30/2007    Priority: Low  . Hemorrhoid 10/30/2014    Medications- reviewed and updated Current Outpatient Medications  Medication Sig Dispense Refill  . diphenhydrAMINE (BENADRYL) 25 MG tablet Take 25 mg by mouth every 6 (six) hours as needed for itching.    . Multiple Vitamin (MULTI VITAMIN PO) Take by mouth.    Marland Kitchen OVER THE COUNTER MEDICATION Take 1,000 mg by mouth daily. Tumeric    . pramoxine-hydrocortisone (ANALPRAM-HC) 1-1 % rectal cream Place 1 application rectally 2 (two) times daily. 30 g 0  . tadalafil (CIALIS) 10 MG tablet Take 1 tablet (10 mg total) by mouth every 3  (three) days as needed for erectile dysfunction. 10 tablet 3  . amLODipine (NORVASC) 10 MG tablet Take 1 tablet (10 mg total) by mouth daily. 90 tablet 3  . meloxicam (MOBIC) 7.5 MG tablet Take 1 tablet (7.5 mg total) by mouth daily. Needs labs every 6 months to continue 90 tablet 1  . metoprolol succinate (TOPROL-XL) 50 MG 24 hr tablet Take 2 tablets (100 mg total) by mouth daily. 180 tablet 3  . SUMAtriptan (IMITREX) 50 MG tablet TAKE 1 TABLET BY MOUTH AS NEEDED MIGRAINE 6 tablet 10  . triamcinolone cream (KENALOG) 0.5 % Apply 1 application topically 2 (two) times daily. For 10 days to affected area 30 g 0   No current facility-administered medications for this visit.     Objective:  BP 130/78   Pulse 78   Temp 98.2 F (36.8 C) (Temporal)   Ht 5\' 6"  (1.676 m)   Wt 177 lb 9.6 oz (80.6 kg)   SpO2 96%   BMI 28.67 kg/m  Gen: NAD, resting comfortably CV: RRR no murmurs rubs or gallops Lungs: CTAB no crackles, wheeze, rhonchi Abdomen: soft/nontender/nondistended/normal bowel sounds.  Ext: no edema Skin: warm, dry, several papules on legs erythematous- sites of prior bites    Assessment and Plan   #Hypertension S: Compliant with amlodipine 10 mg, metoprolol 100 mg extended release -did take meloxicam 8 am - home BP: does not  check regularly  BP Readings from Last 3 Encounters:  09/30/20 130/78  02/12/20 129/79  09/24/19 140/80  A/P: Stable. Continue current medications.    #hyperlipidemia S: Medication:none  Lab Results  Component Value Date   CHOL 183 03/19/2019   HDL 41.20 03/19/2019   LDLCALC 81 07/28/2014   LDLDIRECT 112.0 03/19/2019   TRIG 246.0 (H) 03/19/2019   CHOLHDL 4 03/19/2019   A/P: mild HLD with LDL >100- updat elipids and calculate 10 year ascvd risk   # Alpha gal S:contracted around 2017 we believe. Alpha gal labs have improved but not completely normalized.  5 or 6 tick bites within the last year. He is eating all meats but less red meat A/P: he would  like to know status of alpha gal and make sure #s are not worse- it is his preference even with potential risk to continue meat products- knows there is always chance especially with recurrent tick bites of worsening.  -asks about testing for lyme but no target lesions or systemic symptoms after bites- I think low yield -prior tick bites very itchy and tend to hypertrophy- refill triamcinolone for him- helsp some but does not resolve   #Migraines with aura #ED S: Compliant with sumatriptan-uses not even monthly  -certain triggers with hot dogs -migraines a few days after sildenafil- trial cialis A/P: doing reasonably well- continue current meds  - trial cialis instead of sildenafil due to migraines  #Polycythemia-former smoker-has not smoked since 2015 but does get a lot of smoke exposure at work.  #Osteoarthritis- Left knee. celebrex in past now meloxicam more helpful (2021 trial off meloxiam doing ok). If on nsaids- Needs q6 month BMP.  -also helps with back pain -Patient reports doing pretty well on meloxicam recently.  We will refill at 7.5 mg.  We discussed trying Voltaren gel over-the-counter before he starts the new prescription though-it is possible to stop will be more effective and this also lowers his risk of stomach bleeding, stroke, heart attack. voltaren would not help with the back though  #screen prostate cancer- update psa- prior low risk trend Lab Results  Component Value Date   PSA 0.17 03/19/2019   PSA 0.21 01/04/2018   PSA 0.30 11/02/2011   Recommended follow up: keep October visit Future Appointments  Date Time Provider [place]  03/10/2021  3:40 PM Marin Olp, MD [facility]    Lab/Order associations:   ICD-10-CM   1. Allergy to beef  Z91.014 Alpha-Gal Panel  2. Primary hypertension  I10 CBC with Differential/Platelet    Comprehensive metabolic panel    Lipid panel  3. Screening for prostate cancer  Z12.5 PSA  4. Hyperlipidemia,  unspecified hyperlipidemia type  E78.5   5. Migraine with aura and without status migrainosus, not intractable  G43.109     Meds ordered this encounter  Medications  . amLODipine (NORVASC) 10 MG tablet    Sig: Take 1 tablet (10 mg total) by mouth daily.    Dispense:  90 tablet    Refill:  3  . meloxicam (MOBIC) 7.5 MG tablet    Sig: Take 1 tablet (7.5 mg total) by mouth daily. Needs labs every 6 months to continue    Dispense:  90 tablet    Refill:  1  . metoprolol succinate (TOPROL-XL) 50 MG 24 hr tablet    Sig: Take 2 tablets (100 mg total) by mouth daily.    Dispense:  180 tablet    Refill:  3  Please schedule an office visit with Dr. Yong Channel for further refills. 1st attempt.  . tadalafil (CIALIS) 10 MG tablet    Sig: Take 1 tablet (10 mg total) by mouth every 3 (three) days as needed for erectile dysfunction.    Dispense:  10 tablet    Refill:  3  . SUMAtriptan (IMITREX) 50 MG tablet    Sig: TAKE 1 TABLET BY MOUTH AS NEEDED MIGRAINE    Dispense:  6 tablet    Refill:  10  . triamcinolone cream (KENALOG) 0.5 %    Sig: Apply 1 application topically 2 (two) times daily. For 10 days to affected area    Dispense:  30 g    Refill:  0    Return precautions advised.  [name], MD

## 2020-09-30 ENCOUNTER — Encounter: Payer: Self-pay | Admitting: Family Medicine

## 2020-09-30 ENCOUNTER — Other Ambulatory Visit: Payer: Self-pay

## 2020-09-30 ENCOUNTER — Ambulatory Visit: Payer: 59 | Admitting: Family Medicine

## 2020-09-30 VITALS — BP 130/78 | HR 78 | Temp 98.2°F | Ht 66.0 in | Wt 177.6 lb

## 2020-09-30 DIAGNOSIS — E785 Hyperlipidemia, unspecified: Secondary | ICD-10-CM | POA: Diagnosis not present

## 2020-09-30 DIAGNOSIS — G43109 Migraine with aura, not intractable, without status migrainosus: Secondary | ICD-10-CM

## 2020-09-30 DIAGNOSIS — Z91014 Allergy to mammalian meats: Secondary | ICD-10-CM

## 2020-09-30 DIAGNOSIS — Z125 Encounter for screening for malignant neoplasm of prostate: Secondary | ICD-10-CM | POA: Diagnosis not present

## 2020-09-30 DIAGNOSIS — I10 Essential (primary) hypertension: Secondary | ICD-10-CM | POA: Diagnosis not present

## 2020-09-30 LAB — COMPREHENSIVE METABOLIC PANEL
ALT: 41 U/L (ref 0–53)
AST: 25 U/L (ref 0–37)
Albumin: 4.7 g/dL (ref 3.5–5.2)
Alkaline Phosphatase: 58 U/L (ref 39–117)
BUN: 12 mg/dL (ref 6–23)
CO2: 25 mEq/L (ref 19–32)
Calcium: 9.4 mg/dL (ref 8.4–10.5)
Chloride: 106 mEq/L (ref 96–112)
Creatinine, Ser: 0.89 mg/dL (ref 0.40–1.50)
GFR: 92.84 mL/min (ref >60.00)
Glucose, Bld: 122 mg/dL — ABNORMAL HIGH (ref 70–99)
Potassium: 4.2 mEq/L (ref 3.5–5.1)
Sodium: 139 mEq/L (ref 135–145)
Total Bilirubin: 0.7 mg/dL (ref 0.2–1.2)
Total Protein: 6.7 g/dL (ref 6.0–8.3)

## 2020-09-30 LAB — CBC WITH DIFFERENTIAL/PLATELET
Basophils Absolute: 0 10*3/uL (ref 0.0–0.1)
Basophils Relative: 0.7 % (ref 0.0–3.0)
Eosinophils Absolute: 0.1 10*3/uL (ref 0.0–0.7)
Eosinophils Relative: 2.6 % (ref 0.0–5.0)
HCT: 47.5 % (ref 39.0–52.0)
Hemoglobin: 16.8 g/dL (ref 13.0–17.0)
Lymphocytes Relative: 26 % (ref 12.0–46.0)
Lymphs Abs: 1.4 10*3/uL (ref 0.7–4.0)
MCHC: 35.3 g/dL (ref 30.0–36.0)
MCV: 93 fl (ref 78.0–100.0)
Monocytes Absolute: 0.7 10*3/uL (ref 0.1–1.0)
Monocytes Relative: 13.7 % — ABNORMAL HIGH (ref 3.0–12.0)
Neutro Abs: 3 10*3/uL (ref 1.4–7.7)
Neutrophils Relative %: 57 % (ref 43.0–77.0)
Platelets: 144 10*3/uL — ABNORMAL LOW (ref 150.0–400.0)
RBC: 5.11 Mil/uL (ref 4.22–5.81)
RDW: 12.6 % (ref 11.5–15.5)
WBC: 5.2 10*3/uL (ref 4.0–10.5)

## 2020-09-30 LAB — LIPID PANEL
Cholesterol: 170 mg/dL (ref 0–200)
HDL: 41.5 mg/dL (ref >39.00)
NonHDL: 128.39
Total CHOL/HDL Ratio: 4
Triglycerides: 299 mg/dL — ABNORMAL HIGH (ref 0.0–149.0)
VLDL: 59.8 mg/dL — ABNORMAL HIGH (ref 0.0–40.0)

## 2020-09-30 LAB — LDL CHOLESTEROL, DIRECT: Direct LDL: 110 mg/dL

## 2020-09-30 LAB — PSA: PSA: 0.23 ng/mL (ref 0.10–4.00)

## 2020-09-30 MED ORDER — SUMATRIPTAN SUCCINATE 50 MG PO TABS
ORAL_TABLET | ORAL | 10 refills | Status: DC
Start: 2020-09-30 — End: 2022-03-17

## 2020-09-30 MED ORDER — MELOXICAM 7.5 MG PO TABS
7.5000 mg | ORAL_TABLET | Freq: Every day | ORAL | 1 refills | Status: DC
Start: 2020-09-30 — End: 2021-09-15

## 2020-09-30 MED ORDER — METOPROLOL SUCCINATE ER 50 MG PO TB24
100.0000 mg | ORAL_TABLET | Freq: Every day | ORAL | 3 refills | Status: DC
Start: 2020-09-30 — End: 2021-12-21

## 2020-09-30 MED ORDER — TRIAMCINOLONE ACETONIDE 0.5 % EX CREA: 1.0000 "application " | TOPICAL_CREAM | Freq: Two times a day (BID) | CUTANEOUS | 0 refills | Status: AC

## 2020-09-30 MED ORDER — TADALAFIL 10 MG PO TABS: 10.0000 mg | ORAL_TABLET | ORAL | 3 refills | Status: DC | PRN

## 2020-09-30 MED ORDER — AMLODIPINE BESYLATE 10 MG PO TABS
1.0000 | ORAL_TABLET | Freq: Every day | ORAL | 3 refills | Status: DC
Start: 2020-09-30 — End: 2021-11-14

## 2020-10-05 LAB — ALPHA-GAL PANEL
Beef IgE: 0.98 kU/L — ABNORMAL HIGH (ref <0.35)
Class: 2
Galactose-alpha-1,3-galactose IgE: 35.3 kU/L — ABNORMAL HIGH (ref <0.10)
LAMB/MUTTON IGE: 0.29 kU/L (ref <0.35)
Pork IgE: 0.25 kU/L (ref <0.35)

## 2020-10-29 ENCOUNTER — Telehealth: Payer: Self-pay

## 2020-10-29 NOTE — Telephone Encounter (Signed)
Patient scheduled appt.

## 2020-10-29 NOTE — Telephone Encounter (Signed)
Nurse Assessment Nurse: Eugenio Hoes, RN, Jenny Reichmann Date/Time (Eastern Time): 10/28/2020 4:21:25 PM Confirm and document reason for call. If symptomatic, describe symptoms. ---Caller states that he thinks he has a hernia below the belt around groin area. Denies any pain. Does the patient have any new or worsening symptoms? ---Yes Will a triage be completed? ---Yes Related visit to physician within the last 2 weeks? ---No Does the PT have any chronic conditions? (i.e. diabetes, asthma, this includes High risk factors for pregnancy, etc.) ---No Is this a behavioral health or substance abuse call? ---No Guidelines Guideline Title Affirmed Question Affirmed Notes Nurse Date/Time (Eastern Time) Hernia [1] New-onset hernia suspected (reducible bulge in groin or abdomen; non-tender) AND [2] NO pain or vomiting Eugenio Hoes, RN, Jenny Reichmann 10/28/2020 4:26:53 PM Disp. Time Eilene Ghazi Time) Disposition Final User 10/28/2020 4:30:45 PM SEE PCP WITHIN 3 DAYS Yes Eugenio Hoes, RN, Jenny Reichmann PLEASE NOTE: All timestamps contained within this report are represented as [place] Standard Time. CONFIDENTIALTY NOTICE: This fax transmission is intended only for the addressee. It contains information that is legally privileged, confidential or otherwise protected from use or disclosure. If you are not the intended recipient, you are strictly prohibited from reviewing, disclosing, copying using or disseminating any of this information or taking any action in reliance on or regarding this information. If you have received this fax in error, please notify us immediately by telephone so that we can arrange for its return to [place]. Phone: [phone], Toll-Free: [phone], Fax: [phone] Page: 2 of 2 Call Id: [id number] [place] Disagree/Comply Comply Caller Understands Yes PreDisposition Call Doctor Care Advice Given Per Guideline SEE PCP WITHIN 3 DAYS: * You need to be seen within 2 or 3 days. AVOID STRAINING: * Avoid straining from  constipation. * Avoid straining from heavy lifting. * Lose weight if you are overweight. CALL BACK IF: * Lump (bulge, hernia) becomes painful or tender to touch * Abdomen pain or vomiting occur * You become worse CARE ADVICE given per Hernia (Adult) guideline Comments User: Baird Cancer, RN Date/Time (Eastern Time): 10/28/2020 4:23:58 PM 5-6/10 when he stands and only lasts a few seconds Referrals REFERRED TO PCP OFFICE

## 2020-11-01 ENCOUNTER — Ambulatory Visit: Payer: 59 | Admitting: Family Medicine

## 2020-11-01 ENCOUNTER — Other Ambulatory Visit: Payer: Self-pay

## 2020-11-01 ENCOUNTER — Encounter: Payer: Self-pay | Admitting: Family Medicine

## 2020-11-01 VITALS — BP 132/76 | HR 78 | Temp 98.3°F | Resp 16 | Ht 66.0 in | Wt 178.8 lb

## 2020-11-01 DIAGNOSIS — K4090 Unilateral inguinal hernia, without obstruction or gangrene, not specified as recurrent: Secondary | ICD-10-CM

## 2020-11-01 NOTE — Patient Instructions (Signed)
Symptoms and exam I am suspicious of a hernia in the right side.  Similar area of previous hernia repair based on the scar.  See information below on hernias but as we discussed if any acute worsening pain that does not improve with movement, be seen through urgent care or ER.  I will refer you to general surgery.  They can decide if imaging such as CT scan is needed.  Let me know if there are questions.  Thank you for coming in today and take care.   Return to the clinic or go to the nearest emergency room if any of your symptoms worsen or new symptoms occur.   Hernia, Adult     A hernia is the bulging of an organ or tissue through a weak spot in the muscles of the abdomen (abdominal wall). Hernias develop most often near the belly button (navel) or the area where the leg meets the lower abdomen (groin). Common types of hernias include: Incisional hernia. This type bulges through a scar from an abdominal surgery. Umbilical hernia. This type develops near the navel. Inguinal hernia. This type develops in the groin or scrotum. Femoral hernia. This type develops under the groin, in the upper thigh area. Hiatal hernia. This type occurs when part of the stomach slides above the muscle that separates the abdomen from the chest (diaphragm). What are the causes? This condition may be caused by: Heavy lifting. Coughing over a long period of time. Straining to have a bowel movement. Constipation can lead to straining. An incision made during an abdominal surgery. A physical problem that is present at birth (congenital defect). Being overweight or obese. Smoking. Excess fluid in the abdomen. Undescended testicles in males. What are the signs or symptoms? The main symptom is a skin-colored, rounded bulge in the area of the hernia. However, a bulge may not always be present. It may grow bigger or be morevisible when you cough or strain (such as when lifting something heavy). A hernia that can be  pushed back into the area (is reducible) rarely causes pain. A hernia that cannot be pushed back into the area (is incarcerated) may lose its blood supply (become strangulated). A hernia that is incarcerated may cause: Pain. Fever. Nausea and vomiting. Swelling. Constipation. How is this diagnosed? A hernia may be diagnosed based on: Your symptoms and medical history. A physical exam. Your health care provider may ask you to cough or move in certain ways to see if the hernia becomes visible. Imaging tests, such as: X-rays. Ultrasound. CT scan. How is this treated? A hernia that is small and painless may not need to be treated. A hernia that is large or painful may be treated with surgery. Inguinal hernias may be treated with surgery to prevent incarceration or strangulation. Strangulated hernias are always treated with surgery because a lack of blood supply to thetrapped organ or tissue can cause it to die. Surgery to treat a hernia involves pushing the bulge back into place andrepairing the weak area of the muscle or abdominal wall. Follow these instructions at home: Activity Avoid straining. Do not lift anything that is heavier than 10 lb (4.5 kg), or the limit that you are told, until your health care provider says that it is safe. When lifting heavy objects, lift with your leg muscles, not your back muscles. Preventing constipation Take actions to prevent constipation. Constipation leads to straining with bowel movements, which can make a hernia worse or cause a hernia repair to break  down. Your health care provider may recommend that you: Drink enough fluid to keep your urine pale yellow. Eat foods that are high in fiber, such as fresh fruits and vegetables, whole grains, and beans. Limit foods that are high in fat and processed sugars, such as fried or sweet foods. Take an over-the-counter or prescription medicine for constipation. General instructions When coughing, try to cough  gently. You may try to push the hernia back in place by very gently pressing on it while lying down. Do not try to force the bulge back in if it will not push in easily. If you are overweight, work with your health care provider to lose weight safely. Do not use any products that contain nicotine or tobacco, such as cigarettes and e-cigarettes. If you need help quitting, ask your health care provider. If you are scheduled for hernia repair, watch your hernia for any changes in shape, size, or color. Tell your health care provider about any changes or new symptoms. Take over-the-counter and prescription medicines only as told by your health care provider. Keep all follow-up visits as told by your health care provider. This is important. Contact a health care provider if: You develop new pain, swelling, or redness around your hernia. You have signs of constipation, such as: Fewer bowel movements in a week than normal. Difficulty having a bowel movement. Stools that are dry, hard, or larger than normal. Get help right away if: You have a fever. You have abdomen pain that gets worse. You feel nauseous or you vomit. You cannot push the hernia back in place by very gently pressing on it while lying down. Do not try to force the bulge back in if it will not push in easily. The hernia: Changes in shape, size, or color. Feels hard or tender. These symptoms may represent a serious problem that is an emergency. Do not wait to see if the symptoms will go away. Get medical help right away. Call your local emergency services (911 in the [place] Summary A hernia is the bulging of an organ or tissue through a weak spot in the muscles of the abdomen (abdominal wall). The main symptom is a skin-colored, rounded lump (bulge) in the hernia area. However, a bulge may not always be present. It may grow bigger or more visible when you cough or strain (such as when having a bowel movement). A hernia that is small  and painless may not need to be treated. A hernia that is large or painful may be treated with surgery. Surgery to treat a hernia involves pushing the bulge back into place and repairing the weak part of the abdomen. This information is not intended to replace advice given to you by your health care provider. Make sure you discuss any questions you have with your healthcare provider. Document Revised: 08/22/2018 Document Reviewed: 01/31/2017 Elsevier Patient Education  [place].

## 2020-11-01 NOTE — Progress Notes (Signed)
Subjective:  Patient ID: Jordan Cantrell, male    DOB: 03/11/1961  Age: 60 y.o. MRN: [id number]  CC:  Chief Complaint  Patient presents with   Hernia    Pt reports possible hernia for 3 months, notes felt as a warm spot on occasion, sneezed last week and feels this shifted the possible hernia, notes when he stands from sitting it is painful for a few seconds, along Rt side of groin, notes old scar in this area.     HPI Jordan Cantrell presents for   R sided hernia: Warm/burning sensation R groin past 3 months. Sneezed when driving few weeks ago- felt balloon sensation in R groin, uncomfortable for 15 seconds then improved. No visible bulge.  Still episodic discomfort with standing to walk  - improves with walking. No n/v/fever or skin redness/changes. No rash.  No bowel changes.   Had hernia repair as a child - thinks was on R side.   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  No straining.   History Patient Active Problem List   Diagnosis Date Noted   Hyperlipidemia, unspecified 09/30/2020   Allergy to beef 02/17/2016   Hemorrhoid 10/30/2014   Osteoarthritis 07/22/2014   History of colonic polyps 07/22/2014   Insomnia 11/12/2012   Hypertension 11/12/2012   Tobacco abuse 11/02/2011   Urinary frequency 09/11/2011   LOW BACK PAIN SYNDROME 09/24/2009   Migraine 09/30/2007   Dry eyes 09/30/2007   Allergic rhinitis 09/30/2007   History of shingles 09/30/2007   Past Medical History:  Diagnosis Date   Allergic rhinitis, cause unspecified    to tick bite - resolved   Arthritis    knees, hands   Balanoposthitis    Mild   Chest pain, unspecified    2007- stress test normal   Constipation 09/24/2009   Prn metamucil  - occasional   Dry eye syndrome    Hearing loss    slight - bilateral - no hearing aids   HEMORRHOID, EXTERNAL, THROMBOSED 05/06/2010   Qualifier: Diagnosis of  By: Ronnald Ramp MD, Arvid Right.    History of shingles    Hypertension    Migraine,  unspecified, without mention of intractable migraine without mention of status migrainosus    TINNITUS NOS 01/31/2008   Qualifier: Diagnosis of  By: Plotnikov MD, Evie Lacks    Past Surgical History:  Procedure Laterality Date   COLONOSCOPY  2013    hx polyps    FOOT FRACTURE SURGERY  1993   right-crushed all 4 toes reportedly   KNEE ARTHROSCOPY  2011   Right for Condromalacia   ORIF FEMUR FRACTURE  1993   Right   WISDOM TOOTH EXTRACTION     Allergies  Allergen Reactions   Codeine Nausea Only   Prior to Admission medications   Medication Sig Start Date End Date Taking? Authorizing Provider  amLODipine (NORVASC) 10 MG tablet Take 1 tablet (10 mg total) by mouth daily. 09/30/20  Yes Marin Olp, MD  diphenhydrAMINE (BENADRYL) 25 MG tablet Take 25 mg by mouth every 6 (six) hours as needed for itching.   Yes [provider]  meloxicam (MOBIC) 7.5 MG tablet Take 1 tablet (7.5 mg total) by mouth daily. Needs labs every 6 months to continue 09/30/20  Yes Marin Olp, MD  metoprolol succinate (TOPROL-XL) 50 MG 24 hr tablet Take 2 tablets (100 mg total) by mouth daily. 09/30/20  Yes Marin Olp, MD  Multiple Vitamin ([place] VITAMIN  PO) Take by mouth.   Yes [provider]  OVER THE COUNTER MEDICATION Take 1,000 mg by mouth daily. Tumeric   Yes [provider]  pramoxine-hydrocortisone (ANALPRAM-HC) 1-1 % rectal cream Place 1 application rectally 2 (two) times daily. 05/27/18  Yes Marin Olp, MD  SUMAtriptan (IMITREX) 50 MG tablet TAKE 1 TABLET BY MOUTH AS NEEDED MIGRAINE 09/30/20  Yes Marin Olp, MD  tadalafil (CIALIS) 10 MG tablet Take 1 tablet (10 mg total) by mouth every 3 (three) days as needed for erectile dysfunction. 09/30/20  Yes Marin Olp, MD  triamcinolone cream (KENALOG) 0.5 % Apply 1 application topically 2 (two) times daily. For 10 days to affected area 09/30/20  Yes Hunter, Brayton Mars, MD   Social History   Socioeconomic  History   Marital status: Married    Spouse name: Not on file   Number of children: 2   Years of education: 16   Highest education level: Not on file  Occupational History   Occupation: Dealer    Employer: [employer]  Tobacco Use   Smoking status: Former    Packs/day: 0.10    Pack years: 0.00    Types: Cigarettes    Quit date: 04/28/2014    Years since quitting: 6.5   Smokeless tobacco: Current    Types: Snuff  Substance and Sexual Activity   Alcohol use: Yes    Alcohol/week: 10.0 - 12.0 standard drinks    Types: 10 - 12 Cans of beer per week    Comment: beer/liquor   Drug use: No   Sexual activity: Yes    Partners: Female  Other Topics Concern   Not on file  Social History Narrative   Married-'81. 1 dtr - '87, 1 son - '83, 2 granddaughters   Marriage in good health.       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       Hobbies: time with grandkids, work with cows, minimal travel due to pain   Social Determinants of Health   Financial Resource Strain: Not on file  Food Insecurity: Not on file  Transportation Needs: Not on file  Physical Activity: Not on file  Stress: Not on file  Social Connections: Not on file  Intimate Partner Violence: Not on file    Review of Systems  Per HPI.   Objective:   Vitals:   11/01/20 1322  BP: 132/76  Pulse: 78  Resp: 16  Temp: 98.3 F (36.8 C)  TempSrc: Temporal  SpO2: 95%  Weight: 178 lb 12.8 oz (81.1 kg)  Height: 5\' 6"  (1.676 m)    Physical Exam Constitutional:      General: He is not in acute distress.    Appearance: Normal appearance. He is well-developed.  HENT:     Head: Normocephalic and atraumatic.  Cardiovascular:     Rate and Rhythm: Normal rate.  Pulmonary:     Effort: Pulmonary effort is normal.  Genitourinary:      Comments: Suspected right inguinal hernia, just medial to well-healed scar.   Slight firmness, possible bulge with cough/Valsalva. Neurological:     Mental Status: He is alert and oriented to person, place, and time.  Psychiatric:        Mood and Affect: Mood normal.       Assessment & Plan:  Jordan Cantrell is a 60 y.o.  male . Right inguinal hernia - Plan: Ambulatory referral to General Surgery  -Suspected right inguinal hernia, minimal on exam.  Potentially may need imaging but will refer to general surgery first.  Hernia precautions/ER precautions discussed and avoid heavy lifting, straining for now.  No orders of the defined types were placed in this encounter.  Patient Instructions  Symptoms and exam I am suspicious of a hernia in the right side.  Similar area of previous hernia repair based on the scar.  See information below on hernias but as we discussed if any acute worsening pain that does not improve with movement, be seen through urgent care or ER.  I will refer you to general surgery.  They can decide if imaging such as CT scan is needed.  Let me know if there are questions.  Thank you for coming in today and take care.   Return to the clinic or go to the nearest emergency room if any of your symptoms worsen or new symptoms occur.   Hernia, Adult     A hernia is the bulging of an organ or tissue through a weak spot in the muscles of the abdomen (abdominal wall). Hernias develop most often near the belly button (navel) or the area where the leg meets the lower abdomen (groin). Common types of hernias include: Incisional hernia. This type bulges through a scar from an abdominal surgery. Umbilical hernia. This type develops near the navel. Inguinal hernia. This type develops in the groin or scrotum. Femoral hernia. This type develops under the groin, in the upper thigh area. Hiatal hernia. This type occurs when part of the stomach slides above the muscle that separates the abdomen from the chest (diaphragm). What are the causes? This condition may  be caused by: Heavy lifting. Coughing over a long period of time. Straining to have a bowel movement. Constipation can lead to straining. An incision made during an abdominal surgery. A physical problem that is present at birth (congenital defect). Being overweight or obese. Smoking. Excess fluid in the abdomen. Undescended testicles in males. What are the signs or symptoms? The main symptom is a skin-colored, rounded bulge in the area of the hernia. However, a bulge may not always be present. It may grow bigger or be morevisible when you cough or strain (such as when lifting something heavy). A hernia that can be pushed back into the area (is reducible) rarely causes pain. A hernia that cannot be pushed back into the area (is incarcerated) may lose its blood supply (become strangulated). A hernia that is incarcerated may cause: Pain. Fever. Nausea and vomiting. Swelling. Constipation. How is this diagnosed? A hernia may be diagnosed based on: Your symptoms and medical history. A physical exam. Your health care provider may ask you to cough or move in certain ways to see if the hernia becomes visible. Imaging tests, such as: X-rays. Ultrasound. CT scan. How is this treated? A hernia that is small and painless may not need to be treated. A hernia that is large or painful may be treated with surgery. Inguinal hernias may be treated with surgery to prevent incarceration or strangulation. Strangulated hernias are always treated with surgery because a lack of blood supply to thetrapped organ or tissue can cause it to die. Surgery to treat a hernia involves pushing the bulge back into place andrepairing the weak area of the muscle or abdominal wall. Follow these instructions at home: Activity Avoid straining. Do not lift anything that is heavier than 10  lb (4.5 kg), or the limit that you are told, until your health care provider says that it is safe. When lifting heavy objects, lift with  your leg muscles, not your back muscles. Preventing constipation Take actions to prevent constipation. Constipation leads to straining with bowel movements, which can make a hernia worse or cause a hernia repair to break down. Your health care provider may recommend that you: Drink enough fluid to keep your urine pale yellow. Eat foods that are high in fiber, such as fresh fruits and vegetables, whole grains, and beans. Limit foods that are high in fat and processed sugars, such as fried or sweet foods. Take an over-the-counter or prescription medicine for constipation. General instructions When coughing, try to cough gently. You may try to push the hernia back in place by very gently pressing on it while lying down. Do not try to force the bulge back in if it will not push in easily. If you are overweight, work with your health care provider to lose weight safely. Do not use any products that contain nicotine or tobacco, such as cigarettes and e-cigarettes. If you need help quitting, ask your health care provider. If you are scheduled for hernia repair, watch your hernia for any changes in shape, size, or color. Tell your health care provider about any changes or new symptoms. Take over-the-counter and prescription medicines only as told by your health care provider. Keep all follow-up visits as told by your health care provider. This is important. Contact a health care provider if: You develop new pain, swelling, or redness around your hernia. You have signs of constipation, such as: Fewer bowel movements in a week than normal. Difficulty having a bowel movement. Stools that are dry, hard, or larger than normal. Get help right away if: You have a fever. You have abdomen pain that gets worse. You feel nauseous or you vomit. You cannot push the hernia back in place by very gently pressing on it while lying down. Do not try to force the bulge back in if it will not push in easily. The  hernia: Changes in shape, size, or color. Feels hard or tender. These symptoms may represent a serious problem that is an emergency. Do not wait to see if the symptoms will go away. Get medical help right away. Call your local emergency services (911 in the [place] Summary A hernia is the bulging of an organ or tissue through a weak spot in the muscles of the abdomen (abdominal wall). The main symptom is a skin-colored, rounded lump (bulge) in the hernia area. However, a bulge may not always be present. It may grow bigger or more visible when you cough or strain (such as when having a bowel movement). A hernia that is small and painless may not need to be treated. A hernia that is large or painful may be treated with surgery. Surgery to treat a hernia involves pushing the bulge back into place and repairing the weak part of the abdomen. This information is not intended to replace advice given to you by your health care provider. Make sure you discuss any questions you have with your healthcare provider. Document Revised: 08/22/2018 Document Reviewed: 01/31/2017 Elsevier Patient Education  2021 [place],   Merri Ray, MD [facility] 11/01/20 1:54 PM

## 2020-11-16 ENCOUNTER — Encounter: Payer: Self-pay | Admitting: Family

## 2020-11-16 ENCOUNTER — Ambulatory Visit: Payer: 59 | Admitting: Family

## 2020-11-16 ENCOUNTER — Other Ambulatory Visit: Payer: Self-pay

## 2020-11-16 VITALS — BP 142/82 | HR 73 | Temp 97.6°F | Wt 177.2 lb

## 2020-11-16 DIAGNOSIS — S91109A Unspecified open wound of unspecified toe(s) without damage to nail, initial encounter: Secondary | ICD-10-CM | POA: Diagnosis not present

## 2020-11-16 DIAGNOSIS — R0789 Other chest pain: Secondary | ICD-10-CM | POA: Diagnosis not present

## 2020-11-16 DIAGNOSIS — R200 Anesthesia of skin: Secondary | ICD-10-CM

## 2020-11-16 NOTE — Patient Instructions (Addendum)
Nonspecific Chest Pain Chest pain can be caused by many different conditions. Some causes of chest pain can be life-threatening. These will require treatment right away. Serious causes of chest pain include: Heart attack. A tear in the body's main blood vessel. Redness and swelling (inflammation) around your heart. Blood clot in your lungs. Other causes of chest pain may not be so serious. These include: Heartburn. Anxiety or stress.  Damage to bones or muscles in your chest. Lung infections. Chest pain can feel like: Pain or discomfort in your chest. Crushing, pressure, aching, or squeezing pain. Burning or tingling. Dull or sharp pain that is worse when you move, cough, or take a deep breath. Pain or discomfort that is also felt in your back, neck, jaw, shoulder, or arm, or pain that spreads to any of these areas. It is hard to know whether your pain is caused by something that is serious or something that is not so serious. So it is important to see your doctor rightaway if you have chest pain. Follow these instructions at home: Medicines Take over-the-counter and prescription medicines only as told by your doctor. If you were prescribed an antibiotic medicine, take it as told by your doctor. Do not stop taking the antibiotic even if you start to feel better. Lifestyle  Rest as told by your doctor. Do not use any products that contain nicotine or tobacco, such as cigarettes, e-cigarettes, and chewing tobacco. If you need help quitting, ask your doctor. Do not drink alcohol. Make lifestyle changes as told by your doctor. These may include: Getting regular exercise. Ask your doctor what activities are safe for you. Eating a heart-healthy diet. A diet and nutrition specialist (dietitian) can help you to learn healthy eating options. Staying at a healthy weight. Treating diabetes or high blood pressure, if needed. Lowering your stress. Activities such as yoga and relaxation techniques  can help.  General instructions Pay attention to any changes in your symptoms. Tell your doctor about them or any new symptoms. Avoid any activities that cause chest pain. Keep all follow-up visits as told by your doctor. This is important. You may need more testing if your chest pain does not go away. Contact a doctor if: Your chest pain does not go away. You feel depressed. You have a fever. Get help right away if: Your chest pain is worse. You have a cough that gets worse, or you cough up blood. You have very bad (severe) pain in your belly (abdomen). You pass out (faint). You have either of these for no clear reason: Sudden chest discomfort. Sudden discomfort in your arms, back, neck, or jaw. You have shortness of breath at any time. You suddenly start to sweat, or your skin gets clammy. You feel sick to your stomach (nauseous). You throw up (vomit). You suddenly feel lightheaded or dizzy. You feel very weak or tired. Your heart starts to beat fast, or it feels like it is skipping beats. These symptoms may be an emergency. Do not wait to see if the symptoms will go away. Get medical help right away. Call your local emergency services (911 in the [place] Do not drive yourself to the hospital. Summary Chest pain can be caused by many different conditions. The cause may be serious and need treatment right away. If you have chest pain, see your doctor right away. Follow your doctor's instructions for taking medicines and making lifestyle changes. Keep all follow-up visits as told by your doctor. This includes visits for any  further testing if your chest pain does not go away. Be sure to know the signs that show that your condition has become worse. Get help right away if you have these symptoms. This information is not intended to replace advice given to you by your health care provider. Make sure you discuss any questions you have with your healthcare provider. Document Revised:  11/01/2017 Document Reviewed: 11/01/2017 Elsevier Patient Education  2022 [employer].

## 2020-11-17 ENCOUNTER — Other Ambulatory Visit: Payer: Self-pay | Admitting: Family

## 2020-11-17 ENCOUNTER — Ambulatory Visit (INDEPENDENT_AMBULATORY_CARE_PROVIDER_SITE_OTHER): Payer: 59

## 2020-11-17 DIAGNOSIS — R9431 Abnormal electrocardiogram [ECG] [EKG]: Secondary | ICD-10-CM

## 2020-11-17 DIAGNOSIS — R0789 Other chest pain: Secondary | ICD-10-CM

## 2020-11-17 LAB — CBC WITH DIFFERENTIAL/PLATELET
Basophils Absolute: 0 10*3/uL (ref 0.0–0.1)
Basophils Relative: 0.4 % (ref 0.0–3.0)
Eosinophils Absolute: 0.2 10*3/uL (ref 0.0–0.7)
Eosinophils Relative: 2.5 % (ref 0.0–5.0)
HCT: 48.5 % (ref 39.0–52.0)
Hemoglobin: 17.4 g/dL — ABNORMAL HIGH (ref 13.0–17.0)
Lymphocytes Relative: 26.8 % (ref 12.0–46.0)
Lymphs Abs: 2.4 10*3/uL (ref 0.7–4.0)
MCHC: 35.9 g/dL (ref 30.0–36.0)
MCV: 91.7 fl (ref 78.0–100.0)
Monocytes Absolute: 1.1 10*3/uL — ABNORMAL HIGH (ref 0.1–1.0)
Monocytes Relative: 12.2 % — ABNORMAL HIGH (ref 3.0–12.0)
Neutro Abs: 5.1 10*3/uL (ref 1.4–7.7)
Neutrophils Relative %: 58.1 % (ref 43.0–77.0)
Platelets: 143 10*3/uL — ABNORMAL LOW (ref 150.0–400.0)
RBC: 5.29 Mil/uL (ref 4.22–5.81)
RDW: 12.7 % (ref 11.5–15.5)
WBC: 8.8 10*3/uL (ref 4.0–10.5)

## 2020-11-17 LAB — COMPREHENSIVE METABOLIC PANEL
ALT: 47 U/L (ref 0–53)
AST: 31 U/L (ref 0–37)
Albumin: 4.8 g/dL (ref 3.5–5.2)
Alkaline Phosphatase: 61 U/L (ref 39–117)
BUN: 13 mg/dL (ref 6–23)
CO2: 24 mEq/L (ref 19–32)
Calcium: 10 mg/dL (ref 8.4–10.5)
Chloride: 104 mEq/L (ref 96–112)
Creatinine, Ser: 1.02 mg/dL (ref 0.40–1.50)
GFR: 79.55 mL/min (ref >60.00)
Glucose, Bld: 93 mg/dL (ref 70–99)
Potassium: 4 mEq/L (ref 3.5–5.1)
Sodium: 139 mEq/L (ref 135–145)
Total Bilirubin: 0.7 mg/dL (ref 0.2–1.2)
Total Protein: 7.3 g/dL (ref 6.0–8.3)

## 2020-11-17 NOTE — Progress Notes (Signed)
Acute Office Visit  Subjective:    Patient ID: Jordan Cantrell, male    DOB: [birth year], 60 y.o.   MRN: [id number]  Chief Complaint  Patient presents with  . Nail Problem    Right big toe, worsening over the past month. C/o constant itching and numbness. States he has had several tick bites this season - does not know if this could be causing the fungus  . Chest Pain    Random episodes, states it may be indigestion. Has been going on for months      HPI Patient is in today of random, sharp chest discomfort that is been ongoing for several weeks.  Reports the pain typically occurs when he is sitting doing nothing.  He has no pain or shortness of breath with activity or with going up and down steps.  He is not able to reproduce the pain when he touches it.  Denies any cough or congestion.  The pain is about a 6 out of 10 lasting 1 to 2 seconds and then going away.  He has these episodes a couple times throughout the day.  Patient has concerns of a sore on his right big toe that he reports popping and is now scabbing over.  Reports itching and numbness of the right big toe.  Admits to calluses on his feet.  Is concerned because he has had several tick bites this season and wants to know if it could be related at all.  He reports a history of a crush fracture in to his right foot many years ago.  Denies any swelling or redness.  Past Medical History:  Diagnosis Date  . Allergic rhinitis, cause unspecified    to tick bite - resolved  . Arthritis    knees, hands  . Balanoposthitis    Mild  . Chest pain, unspecified    2007- stress test normal  . Constipation 09/24/2009   Prn metamucil  - occasional  . Dry eye syndrome   . Hearing loss    slight - bilateral - no hearing aids  . HEMORRHOID, EXTERNAL, THROMBOSED 05/06/2010   Qualifier: Diagnosis of  By: Ronnald Ramp MD, Arvid Right.   . History of shingles   . Hypertension   . Migraine, unspecified, without mention of intractable migraine  without mention of status migrainosus   . TINNITUS NOS 01/31/2008   Qualifier: Diagnosis of  By: Plotnikov MD, Evie Lacks     Past Surgical History:  Procedure Laterality Date  . COLONOSCOPY  2013    hx polyps   . [place]   right-crushed all 4 toes reportedly  . KNEE ARTHROSCOPY  2011   Right for Condromalacia  . ORIF FEMUR FRACTURE  1993   Right  . WISDOM TOOTH EXTRACTION      Family History  Problem Relation Age of Onset  . Diabetes type II Mother   . Heart disease Mother        CABG-died during this, smoker  . Hyperlipidemia Mother   . Hypertension Mother   . Diabetes Father   . Heart disease Father        thought death due to this, smoker  . Hypertension Father   . Schizophrenia Father   . Colon polyps Sister   . Colon cancer Neg Hx   . Stomach cancer Neg Hx   . Esophageal cancer Neg Hx   . Rectal cancer Neg Hx     Social History   Socioeconomic  History  . Marital status: Married    Spouse name: Not on file  . Number of children: 2  . Years of education: 69  . Highest education level: Not on file  Occupational History  . Occupation: Music therapist: [employer]  Tobacco Use  . Smoking status: Former    Packs/day: 0.10    Pack years: 0.00    Types: Cigarettes    Quit date: 04/28/2014    Years since quitting: 6.5  . Smokeless tobacco: Current    Types: Snuff  Substance and Sexual Activity  . Alcohol use: Yes    Alcohol/week: 10.0 - 12.0 standard drinks    Types: 10 - 12 Cans of beer per week    Comment: beer/liquor  . Drug use: No  . Sexual activity: Yes    Partners: Female  Other Topics Concern  . Not on file  Social History Narrative   Married-'81. 1 dtr - '87, 1 son - '83, 2 granddaughters   Marriage in good health.       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       Hobbies: time with grandkids, work with cows,  minimal travel due to pain   Social Determinants of Health   Financial Resource Strain: Not on file  Food Insecurity: Not on file  Transportation Needs: Not on file  Physical Activity: Not on file  Stress: Not on file  Social Connections: Not on file  Intimate Partner Violence: Not on file    Outpatient Medications Prior to Visit  Medication Sig Dispense Refill  . amLODipine (NORVASC) 10 MG tablet Take 1 tablet (10 mg total) by mouth daily. 90 tablet 3  . diphenhydrAMINE (BENADRYL) 25 MG tablet Take 25 mg by mouth every 6 (six) hours as needed for itching.    . meloxicam (MOBIC) 7.5 MG tablet Take 1 tablet (7.5 mg total) by mouth daily. Needs labs every 6 months to continue 90 tablet 1  . metoprolol succinate (TOPROL-XL) 50 MG 24 hr tablet Take 2 tablets (100 mg total) by mouth daily. 180 tablet 3  . Multiple Vitamin (MULTI VITAMIN PO) Take by mouth.    Marland Kitchen OVER THE COUNTER MEDICATION Take 1,000 mg by mouth daily. Tumeric    . pramoxine-hydrocortisone (ANALPRAM-HC) 1-1 % rectal cream Place 1 application rectally 2 (two) times daily. 30 g 0  . SUMAtriptan (IMITREX) 50 MG tablet TAKE 1 TABLET BY MOUTH AS NEEDED MIGRAINE 6 tablet 10  . tadalafil (CIALIS) 10 MG tablet Take 1 tablet (10 mg total) by mouth every 3 (three) days as needed for erectile dysfunction. 10 tablet 3  . triamcinolone cream (KENALOG) 0.5 % Apply 1 application topically 2 (two) times daily. For 10 days to affected area 30 g 0   No facility-administered medications prior to visit.    Allergies  Allergen Reactions  . Codeine Nausea Only    Review of Systems  Constitutional: Negative.   Respiratory: Negative.  Negative for shortness of breath.   Cardiovascular:  Positive for chest pain. Negative for palpitations and leg swelling.  Gastrointestinal: Negative.   Endocrine: Negative.   Genitourinary: Negative.   Musculoskeletal: Negative.   Skin:  Positive for wound.       Right great toe wound   Allergic/Immunologic: Negative.   Neurological: Negative.   Psychiatric/Behavioral: Negative.    All other systems reviewed  and are negative.     Objective:    Physical Exam Vitals and nursing note reviewed.  Constitutional:      Appearance: He is well-developed and normal weight.  Cardiovascular:     Rate and Rhythm: Normal rate and regular rhythm.     Pulses:          Carotid pulses are 2+ on the right side and 2+ on the left side.      Radial pulses are 2+ on the right side and 2+ on the left side.       Dorsalis pedis pulses are 2+ on the right side and 2+ on the left side.       Posterior tibial pulses are 2+ on the right side and 2+ on the left side.     Heart sounds: Normal heart sounds. No murmur heard. Pulmonary:     Effort: Pulmonary effort is normal.     Breath sounds: Normal breath sounds.  Chest:     Chest wall: No deformity or tenderness.  Abdominal:     General: Bowel sounds are normal.     Palpations: Abdomen is soft.  Musculoskeletal:        General: Normal range of motion.     Cervical back: Normal range of motion and neck supple.     Comments: Right great toe: Healing superficial abrasion noted to the cuticle area.  No redness, no swelling, no drainage.  Has a callus adjacent to the right great toe.  Skin:    General: Skin is warm and dry.  Neurological:     General: No focal deficit present.     Mental Status: He is alert. He is disoriented.  Psychiatric:        Mood and Affect: Mood normal.        Behavior: Behavior normal.   BP (!) 142/82   Pulse 73   Temp 97.6 F (36.4 C) (Temporal)   Wt 177 lb 3.2 oz (80.4 kg)   SpO2 95%   BMI 28.60 kg/m  Wt Readings from Last 3 Encounters:  11/16/20 177 lb 3.2 oz (80.4 kg)  11/01/20 178 lb 12.8 oz (81.1 kg)  09/30/20 177 lb 9.6 oz (80.6 kg)    There are no preventive care reminders to display for this patient.  There are no preventive care reminders to display for this patient.   Lab Results   Component Value Date   TSH 2.83 07/28/2014   Lab Results  Component Value Date   WBC 8.8 11/16/2020   HGB 17.4 (H) 11/16/2020   HCT 48.5 11/16/2020   MCV 91.7 11/16/2020   PLT 143.0 (L) 11/16/2020   Lab Results  Component Value Date   NA 139 11/16/2020   K 4.0 11/16/2020   CO2 24 11/16/2020   GLUCOSE 93 11/16/2020   BUN 13 11/16/2020   CREATININE 1.02 11/16/2020   BILITOT 0.7 11/16/2020   ALKPHOS 61 11/16/2020   AST 31 11/16/2020   ALT 47 11/16/2020   PROT 7.3 11/16/2020   ALBUMIN 4.8 11/16/2020   CALCIUM 10.0 11/16/2020   GFR 79.55 11/16/2020   Lab Results  Component Value Date   CHOL 170 09/30/2020   Lab Results  Component Value Date   HDL 41.50 09/30/2020   Lab Results  Component Value Date   LDLCALC 81 07/28/2014   Lab Results  Component Value Date   TRIG 299.0 (H) 09/30/2020   Lab Results  Component Value Date   CHOLHDL 4 09/30/2020  No results found for: HGBA1C     Assessment & Plan:   Problem List Items Addressed This Visit   None Visit Diagnoses     Chest tightness    -  Primary   Relevant Orders   EKG 12-Lead (Completed)   Ambulatory referral to Cardiology   Comp Met (CMET) (Completed)   CBC w/Diff (Completed)   DG Chest 2 View   Numbness of toes       Relevant Orders   Ambulatory referral to Podiatry   Open wound of toe, initial encounter       Relevant Orders   Ambulatory referral to Podiatry       Office visit today was primarily about the right great toe.  However patient complains of intermittent sharp bursts of chest pain.  EKG consistent with previous from 2017.  Chest x-ray ordered.  Referral to cardiology given his family history of cardiovascular disease.  48-hour Holter monitor also ordered.  He has not established with a cardiologist at this point.  I seriously doubt that this has any cardiac etiology.  Referral to podiatry also completed.  We will follow-up patient pending the results of his labs and sooner as  needed.   Kennyth Arnold, FNP

## 2020-11-17 NOTE — Progress Notes (Unsigned)
Patient enrolled for [employer] to mail a 2-3 day ZIO XT monitor to address on file.

## 2020-11-18 ENCOUNTER — Other Ambulatory Visit: Payer: Self-pay

## 2020-11-18 ENCOUNTER — Ambulatory Visit
Admission: RE | Admit: 2020-11-18 | Discharge: 2020-11-18 | Disposition: A | Discharge status: Home / Self Care | Payer: 59 | Source: Ambulatory Visit | Attending: Family | Admitting: Family

## 2020-11-18 DIAGNOSIS — R0789 Other chest pain: Secondary | ICD-10-CM

## 2020-11-19 DIAGNOSIS — R0789 Other chest pain: Secondary | ICD-10-CM | POA: Diagnosis not present

## 2020-11-19 DIAGNOSIS — R9431 Abnormal electrocardiogram [ECG] [EKG]: Secondary | ICD-10-CM | POA: Diagnosis not present

## 2020-11-24 ENCOUNTER — Ambulatory Visit: Payer: 59 | Admitting: Podiatry

## 2020-11-24 ENCOUNTER — Encounter: Payer: Self-pay | Admitting: Podiatry

## 2020-11-24 ENCOUNTER — Other Ambulatory Visit: Payer: Self-pay | Admitting: Podiatry

## 2020-11-24 ENCOUNTER — Other Ambulatory Visit: Payer: Self-pay

## 2020-11-24 ENCOUNTER — Ambulatory Visit (INDEPENDENT_AMBULATORY_CARE_PROVIDER_SITE_OTHER): Payer: 59

## 2020-11-24 DIAGNOSIS — M79671 Pain in right foot: Secondary | ICD-10-CM

## 2020-11-24 DIAGNOSIS — D169 Benign neoplasm of bone and articular cartilage, unspecified: Secondary | ICD-10-CM

## 2020-11-24 DIAGNOSIS — B351 Tinea unguium: Secondary | ICD-10-CM | POA: Diagnosis not present

## 2020-11-24 NOTE — Progress Notes (Signed)
Subjective:   Patient ID: Jordan Cantrell, male   DOB: 60 y.o.   MRN: [id number]   HPI Patient presents concerned about changes in his right big toenail with 2 different infections which are partially resolved now with loss of his left hallux nail with history of fracture of the right foot.  Patient does not smoke likes to be active   Review of Systems  All other systems reviewed and are negative.      Objective:  Physical Exam Vitals and nursing note reviewed.  Constitutional:      Appearance: He is well-developed.  Pulmonary:     Effort: Pulmonary effort is normal.  Musculoskeletal:        General: Normal range of motion.  Skin:    General: Skin is warm.  Neurological:     Mental Status: He is alert.    Neurovascular status intact muscle strength adequate range of motion adequate with patient found to have a right hallux nail on the proximal portion that has history of drainage with some slight redness noted localized with no proximal edema or edema drainage noted loss of the left hallux nail bed and structural changes right foot secondary to previous fracture with dorsal spur formation.  Good digital perfusion well oriented x3    Assessment:  Patient who may have a fungal infection of the right hallux systemically or possible bacterial infection or other nail pathology with loss of nail left and bone spur formation right with history of fracture     Plan:  H&P x-rays right reviewed all conditions discussed explained and we will begin soaks of the right I discussed oral antifungal pulse therapy if the symptoms recur again and I explained what to do in that sense.  Do not recommend removal of bone spur currently fractures have healed in a position its not significantly good but does not give him problems so will be ignored at this point  X-rays indicate severe fractures fourth and fifth metatarsal right that have healed with bone spur formation dorsally

## 2020-12-24 ENCOUNTER — Encounter: Payer: Self-pay | Admitting: Gastroenterology

## 2021-03-03 NOTE — Progress Notes (Signed)
CARDIOLOGY CONSULT NOTE       Patient ID: Jordan Cantrell MRN: [id number] DOB/AGE: [birth year] 60 y.o.  Admit date: (Not on file) Referring Physician: Yong Channel Primary Physician: Marin Olp, MD Primary Cardiologist: New Reason for Consultation: Chest Pain  Active Problems:   * No active hospital problems. *   HPI:  60 y.o. referred by Dr Yong Channel for chest pain Quit smoking in 2015 and Rx for HTN He has had alpha gal, migraines polycythemia and osteoarthritis in knees  Seen by Dutch Quint FNP 11/16/20 for months of random pain in chest ? Indigestion Pain is sharp mostly resting only lasts seconds and goes away spontaneously Palpitations benign Monitor 11/29/20 with NSR average HR 75 bpm rare PAC/PVC;s    ECG done 11/16/20 NSR poor R wave progression no change from 2017  He is retired from [employer] Married two children and 3 grand babies  Pain is very atypical mostly resting , random and relieved with belching   ROS All other systems reviewed and negative except as noted above  Past Medical History:  Diagnosis Date   Allergic rhinitis, cause unspecified    to tick bite - resolved   Arthritis    knees, hands   Balanoposthitis    Mild   Chest pain, unspecified    2007- stress test normal   Constipation 09/24/2009   Prn metamucil  - occasional   Dry eye syndrome    Hearing loss    slight - bilateral - no hearing aids   HEMORRHOID, EXTERNAL, THROMBOSED 05/06/2010   Qualifier: Diagnosis of  By: Ronnald Ramp MD, Arvid Right.    History of shingles    Hypertension    Migraine, unspecified, without mention of intractable migraine without mention of status migrainosus    TINNITUS NOS 01/31/2008   Qualifier: Diagnosis of  By: Plotnikov MD, Evie Lacks     Family History  Problem Relation Age of Onset   Diabetes type II Mother    Heart disease Mother        CABG-died during this, smoker   Hyperlipidemia Mother    Hypertension Mother    Diabetes Father    Heart  disease Father        thought death due to this, smoker   Hypertension Father    Schizophrenia Father    Colon polyps Sister    Colon cancer Neg Hx    Stomach cancer Neg Hx    Esophageal cancer Neg Hx    Rectal cancer Neg Hx     Social History   Socioeconomic History   Marital status: Married    Spouse name: Not on file   Number of children: 2   Years of education: 16   Highest education level: Not on file  Occupational History   Occupation: Music therapist: [employer]  Tobacco Use   Smoking status: Former    Packs/day: 0.10    Types: Cigarettes    Quit date: 04/28/2014    Years since quitting: 6.8   Smokeless tobacco: Current    Types: Snuff  Substance and Sexual Activity   Alcohol use: Yes    Alcohol/week: 10.0 - 12.0 standard drinks    Types: 10 - 12 Cans of beer per week    Comment: beer/liquor   Drug use: No   Sexual activity: Yes    Partners: Female  Other Topics Concern   Not on file  Social History Narrative   Married-'81. 1 dtr - '87, 1  son - '83, 2 granddaughters   Marriage in good health.       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       Hobbies: time with grandkids, work with cows, minimal travel due to pain   Social Determinants of Health   Financial Resource Strain: Not on file  Food Insecurity: Not on file  Transportation Needs: Not on file  Physical Activity: Not on file  Stress: Not on file  Social Connections: Not on file  Intimate Partner Violence: Not on file    Past Surgical History:  Procedure Laterality Date   COLONOSCOPY  2013    hx polyps    FOOT FRACTURE SURGERY  1993   right-crushed all 4 toes reportedly   KNEE ARTHROSCOPY  2011   Right for Condromalacia   ORIF FEMUR FRACTURE  1993   Right   WISDOM TOOTH EXTRACTION        Current Outpatient Medications:    amLODipine (NORVASC) 10 MG tablet, Take 1 tablet (10  mg total) by mouth daily., Disp: 90 tablet, Rfl: 3   diclofenac (VOLTAREN) 75 MG EC tablet, diclofenac sodium 75 mg tablet,delayed release  TAKE 1 TABLET BY MOUTH TWICE A DAY, Disp: , Rfl:    diphenhydrAMINE (BENADRYL) 25 MG tablet, Take 25 mg by mouth every 6 (six) hours as needed for itching., Disp: , Rfl:    Green Tea 150 MG CAPS, , Disp: , Rfl:    hydrocortisone 2.5 % cream, PLEASE SEE ATTACHED FOR DETAILED DIRECTIONS, Disp: , Rfl:    metoprolol succinate (TOPROL-XL) 50 MG 24 hr tablet, Take 2 tablets (100 mg total) by mouth daily., Disp: 180 tablet, Rfl: 3   metroNIDAZOLE (METROGEL) 0.75 % gel, Apply topically daily., Disp: , Rfl:    Multiple Vitamin (MULTI VITAMIN PO), Take by mouth., Disp: , Rfl:    OVER THE COUNTER MEDICATION, Take 1,000 mg by mouth daily. Tumeric, Disp: , Rfl:    pramoxine-hydrocortisone (ANALPRAM-HC) 1-1 % rectal cream, Place 1 application rectally 2 (two) times daily., Disp: 30 g, Rfl: 0   SUMAtriptan (IMITREX) 50 MG tablet, TAKE 1 TABLET BY MOUTH AS NEEDED MIGRAINE, Disp: 6 tablet, Rfl: 10   tadalafil (CIALIS) 10 MG tablet, Take 1 tablet (10 mg total) by mouth every 3 (three) days as needed for erectile dysfunction., Disp: 10 tablet, Rfl: 3   triamcinolone cream (KENALOG) 0.5 %, Apply 1 application topically 2 (two) times daily. For 10 days to affected area, Disp: 30 g, Rfl: 0   meloxicam (MOBIC) 7.5 MG tablet, Take 1 tablet (7.5 mg total) by mouth daily. Needs labs every 6 months to continue (Patient not taking: Reported on 03/16/2021), Disp: 90 tablet, Rfl: 1    Physical Exam: Blood pressure 126/76, pulse 74, height 5\' 6"  (1.676 m), weight 175 lb 12.8 oz (79.7 kg), SpO2 98 %.   Affect appropriate Healthy:  appears stated age 60: normal Neck supple with no adenopathy JVP normal no bruits no thyromegaly Lungs clear with no wheezing and good diaphragmatic motion Heart:  S1/S2 no murmur, no rub, gallop or click PMI normal Abdomen: benighn, BS positve, no  tenderness, no AAA no bruit.  No HSM or HJR Distal pulses intact with no bruits No edema Neuro non-focal Skin warm and dry No muscular weakness   Labs:   Lab Results  Component Value Date   WBC 8.8  11/16/2020   HGB 17.4 (H) 11/16/2020   HCT 48.5 11/16/2020   MCV 91.7 11/16/2020   PLT 143.0 (L) 11/16/2020   No results for input(s): NA, K, CL, CO2, BUN, CREATININE, CALCIUM, PROT, BILITOT, ALKPHOS, ALT, AST, GLUCOSE in the last 168 hours.  Invalid input(s): LABALBU No results found for: CKTOTAL, CKMB, CKMBINDEX, TROPONINI  Lab Results  Component Value Date   CHOL 170 09/30/2020   CHOL 183 03/19/2019   CHOL 181 01/04/2018   Lab Results  Component Value Date   HDL 41.50 09/30/2020   HDL 41.20 03/19/2019   HDL 42.40 01/04/2018   Lab Results  Component Value Date   LDLCALC 81 07/28/2014   Lab Results  Component Value Date   TRIG 299.0 (H) 09/30/2020   TRIG 246.0 (H) 03/19/2019   TRIG 294.0 (H) 01/04/2018   Lab Results  Component Value Date   CHOLHDL 4 09/30/2020   CHOLHDL 4 03/19/2019   CHOLHDL 4 01/04/2018   Lab Results  Component Value Date   LDLDIRECT 110.0 09/30/2020   LDLDIRECT 112.0 03/19/2019   LDLDIRECT 114.0 01/04/2018      Radiology: No results found.  EKG: See HPI   ASSESSMENT AND PLAN:   Chest pain:  non cardiac sounding sharp and resting No acute ECG changes CXR 11/18/20 NAD. Family history premature CAD, HTN , LDL 110 Normal ST segments on ECG Shared decision making favor  POET to further risk stratify HTN:  Well controlled.  Continue current medications and low sodium Dash type diet.   HLD:  LDL 110 need for Rx based on calcium score Alpha Gal:  less red meat IGE levels still elevated per primary  Palpitations: benign ECG and monitor on beta blocker observe  ETT F/U PRN if normal  Signed: Jenkins Rouge 03/16/2021, 7:59 AM

## 2021-03-10 ENCOUNTER — Encounter: Payer: 59 | Admitting: Family Medicine

## 2021-03-16 ENCOUNTER — Encounter: Payer: Self-pay | Admitting: Cardiovascular Disease

## 2021-03-16 ENCOUNTER — Ambulatory Visit: Payer: 59 | Admitting: Cardiovascular Disease

## 2021-03-16 ENCOUNTER — Other Ambulatory Visit: Payer: Self-pay

## 2021-03-16 VITALS — BP 126/76 | HR 74 | Ht 66.0 in | Wt 175.8 lb

## 2021-03-16 DIAGNOSIS — R002 Palpitations: Secondary | ICD-10-CM | POA: Diagnosis not present

## 2021-03-16 DIAGNOSIS — I10 Essential (primary) hypertension: Secondary | ICD-10-CM

## 2021-03-16 DIAGNOSIS — R079 Chest pain, unspecified: Secondary | ICD-10-CM

## 2021-03-16 NOTE — Patient Instructions (Signed)
Medication Instructions:  *If you need a refill on your cardiac medications before your next appointment, please call your pharmacy*  Lab Work: If you have labs (blood work) drawn today and your tests are completely normal, you will receive your results only by: [place] (if you have MyChart) OR A paper copy in the mail If you have any lab test that is abnormal or we need to change your treatment, we will call you to review the results.  Testing/Procedures: Your physician has requested that you have an exercise tolerance test. For further information please visit [email or web address]. Please also follow instruction sheet, as given.  Follow-Up: At [facility], you and your health needs are our priority.  As part of our continuing mission to provide you with exceptional heart care, we have created designated Provider Care Teams.  These Care Teams include your primary Cardiologist (physician) and Advanced Practice Providers (APPs -  Physician Assistants and Nurse Practitioners) who all work together to provide you with the care you need, when you need it.  We recommend signing up for the patient portal called "MyChart".  Sign up information is provided on this After Visit Summary.  MyChart is used to connect with patients for Virtual Visits (Telemedicine).  Patients are able to view lab/test results, encounter notes, upcoming appointments, etc.  Non-urgent messages can be sent to your provider as well.   To learn more about what you can do with MyChart, go to [email or web address].    Your next appointment:   As needed  The format for your next appointment:   In Person  Provider:   You may see Dr. Johnsie Cancel or one of the following Advanced Practice Providers on your designated Care Team:   Cecilie Kicks, NP

## 2021-03-24 ENCOUNTER — Ambulatory Visit (INDEPENDENT_AMBULATORY_CARE_PROVIDER_SITE_OTHER): Payer: 59

## 2021-03-24 ENCOUNTER — Other Ambulatory Visit: Payer: Self-pay

## 2021-03-24 DIAGNOSIS — R002 Palpitations: Secondary | ICD-10-CM

## 2021-03-24 DIAGNOSIS — I10 Essential (primary) hypertension: Secondary | ICD-10-CM

## 2021-03-24 DIAGNOSIS — R079 Chest pain, unspecified: Secondary | ICD-10-CM

## 2021-03-24 LAB — EXERCISE TOLERANCE TEST
Angina Index: 0
Duke Treadmill Score: 10
Estimated workload: 10.8
Exercise duration (min): 10 min
Exercise duration (sec): 0 s
MPHR: 159 {beats}/min
Peak HR: 137 {beats}/min
Percent HR: 86 %
RPE: 17
Rest HR: 78 {beats}/min
ST Depression (mm): 0 mm

## 2021-06-28 ENCOUNTER — Encounter: Payer: Self-pay | Admitting: Family Medicine

## 2021-06-28 ENCOUNTER — Other Ambulatory Visit: Payer: Self-pay

## 2021-06-28 MED ORDER — PRAMOXINE-HC 1-1 % EX CREA: TOPICAL_CREAM | Freq: Two times a day (BID) | CUTANEOUS | 3 refills | Status: AC

## 2021-07-07 ENCOUNTER — Encounter: Payer: Self-pay | Admitting: Cardiovascular Disease

## 2021-08-10 NOTE — Progress Notes (Signed)
? ?Phone: [phone] ?  ?Subjective:  ?Patient presents today for their annual physical. Chief complaint-noted.  ? ?See problem oriented charting- ?ROS- full  review of systems was completed and negative  ?except for: light sensitivity(not new), indigestion, anal bleeding (thinks hemorrhoids of fissure- but not too much pain- plus has colonoscopy upcoming, cold intolerance (will check TSH), slightly thinner stool- trying metamucil, urinary frequency and urgency, joint pain, back pian, neck pain ? ? ?The following were reviewed and entered/updated in epic: ?Past Medical History:  ?Diagnosis Date  ? Allergic rhinitis, cause unspecified   ? to tick bite - resolved  ? Arthritis   ? knees, hands  ? Balanoposthitis   ? Mild  ? Chest pain, unspecified   ? 2007- stress test normal  ? Constipation 09/24/2009  ? Prn metamucil  - occasional  ? Dry eye syndrome   ? Hearing loss   ? slight - bilateral - no hearing aids  ? HEMORRHOID, EXTERNAL, THROMBOSED 05/06/2010  ? Qualifier: Diagnosis of  By: Ronnald Ramp MD, Arvid Right.   ? History of shingles   ? Hypertension   ? Migraine, unspecified, without mention of intractable migraine without mention of status migrainosus   ? TINNITUS NOS 01/31/2008  ? Qualifier: Diagnosis of  By: Plotnikov MD, Evie Lacks   ? ?Patient Active Problem List  ? Diagnosis Date Noted  ? Allergy to beef 02/17/2016  ?  Priority: High  ? Tobacco abuse 11/02/2011  ?  Priority: High  ? Hyperlipidemia, unspecified 09/30/2020  ?  Priority: Medium   ? Insomnia 11/12/2012  ?  Priority: Medium   ? Hypertension 11/12/2012  ?  Priority: Medium   ? Migraine 09/30/2007  ?  Priority: Medium   ? Osteoarthritis 07/22/2014  ?  Priority: Low  ? History of colonic polyps 07/22/2014  ?  Priority: Low  ? Urinary frequency 09/11/2011  ?  Priority: Low  ? LOW BACK PAIN SYNDROME 09/24/2009  ?  Priority: Low  ? Dry eyes 09/30/2007  ?  Priority: Low  ? Allergic rhinitis 09/30/2007  ?  Priority: Low  ? History of shingles 09/30/2007  ?   Priority: Low  ? Hemorrhoid 10/30/2014  ? ?Past Surgical History:  ?Procedure Laterality Date  ? COLONOSCOPY  2013  ?  hx polyps   ? [place]  ? right-crushed all 4 toes reportedly  ? KNEE ARTHROSCOPY  2011  ? Right for Condromalacia  ? ORIF FEMUR FRACTURE  1993  ? Right  ? WISDOM TOOTH EXTRACTION    ? ? ?Family History  ?Problem Relation Age of Onset  ? Diabetes type II Mother   ? Heart disease Mother   ?     CABG-died during this, smoker  ? Hyperlipidemia Mother   ? Hypertension Mother   ? Diabetes Father   ? Heart disease Father   ?     thought death due to this, smoker  ? Hypertension Father   ? Schizophrenia Father   ? Colon polyps Sister   ? Colon cancer Neg Hx   ? Stomach cancer Neg Hx   ? Esophageal cancer Neg Hx   ? Rectal cancer Neg Hx   ? ? ?Medications- reviewed and updated ?Current Outpatient Medications  ?Medication Sig Dispense Refill  ? amLODipine (NORVASC) 10 MG tablet Take 1 tablet (10 mg total) by mouth daily. 90 tablet 3  ? diclofenac (VOLTAREN) 75 MG EC tablet diclofenac sodium 75 mg tablet,delayed release ? TAKE 1 TABLET BY  MOUTH TWICE A DAY    ? diphenhydrAMINE (BENADRYL) 25 MG tablet Take 25 mg by mouth every 6 (six) hours as needed for itching.    ? Green Tea 150 MG CAPS     ? hydrocortisone 2.5 % cream PLEASE SEE ATTACHED FOR DETAILED DIRECTIONS    ? metoprolol succinate (TOPROL-XL) 50 MG 24 hr tablet Take 2 tablets (100 mg total) by mouth daily. 180 tablet 3  ? metroNIDAZOLE (METROGEL) 0.75 % gel Apply topically daily.    ? Multiple Vitamin (MULTI VITAMIN PO) Take by mouth.    ? OVER THE COUNTER MEDICATION Take 1,000 mg by mouth daily. Tumeric    ? pramoxine-hydrocortisone (ANALPRAM HC) cream Apply topically in the morning and at bedtime. 57 g 3  ? pramoxine-hydrocortisone (ANALPRAM-HC) 1-1 % rectal cream Place 1 application rectally 2 (two) times daily. 30 g 0  ? SUMAtriptan (IMITREX) 50 MG tablet TAKE 1 TABLET BY MOUTH AS NEEDED MIGRAINE 6 tablet 10  ? tadalafil  (CIALIS) 10 MG tablet Take 1 tablet (10 mg total) by mouth every 3 (three) days as needed for erectile dysfunction. 10 tablet 3  ? triamcinolone cream (KENALOG) 0.5 % Apply 1 application topically 2 (two) times daily. For 10 days to affected area 30 g 0  ? ?No current facility-administered medications for this visit.  ? ? ?Allergies-reviewed and updated ?Allergies  ?Allergen Reactions  ? Codeine Nausea Only  ? ? ?Social History  ? ?Social History Narrative  ? Married-'81. 1 dtr - '87, 1 son - '83, 2 granddaughters  ? Marriage in good health.   ?   ? 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 Retired Teacher, adult education at [employer]   ? HSG. 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   ?   ? Hobbies: time with grandkids, work with cows, minimal travel due to pain  ? ?Objective  ?Objective:  ?BP 130/70   Pulse 73   Temp 98.2 ?F (36.8 ?C)   Ht '5\' 6"'$  (1.676 m)   Wt 176 lb 9.6 oz (80.1 kg)   SpO2 93%   BMI 28.50 kg/m?  ?Gen: NAD, resting comfortably ?HEENT: Mucous membranes are moist. Oropharynx normal ?Neck: no thyromegaly ?CV: RRR no murmurs rubs or gallops ?Lungs: CTAB no crackles, wheeze, rhonchi ?Abdomen: soft/nontender/nondistended/normal bowel sounds. No rebound or guarding.  ?Ext: no edema ?Skin: warm, dry ?Neuro: grossly normal, moves all extremities, PERRLA ?Stable hernia- does better with laying  ?  ?Assessment and Plan  ?61 y.o. Cantrell presenting for annual physical.  ?Health Maintenance counseling: ?1. Anticipatory guidance: Patient counseled regarding regular dental exams -q6 months, eye exams -yearly ,  avoiding smoking and second hand smoke- is dipping and encouraged cessation , limiting alcohol to 2 beverages per day - strongly encouraged to limit. No illicit drugs.    ?2. Risk factor reduction:  Advised patient of need for regular exercise and diet rich and fruits and vegetables to reduce risk of heart attack and stroke.  ?Exercise- less walking as not  working. Encouraged consider either low impact options like bike or water- or could do some weight lifting as well- feels like he is losing some muscle mass after retirement ?Diet/weight management--down 1 lb form last year- congratulated on improvements ?Wt Readings from Last 3 Encounters:  ?09/15/21 176 lb 9.6 oz (80.1 kg)  ?03/16/21 175 lb 12.8 oz (79.7 kg)  ?11/16/20 177 lb 3.2 oz (80.4 kg)  ?3. Immunizations/screenings/ancillary  studies ?DISCUSSED:  ?-COVID - declines ?-Shingrix - wants to hold off ?Immunization History  ?Administered Date(s) Administered  ? Influenza,inj,Quad PF,6+ Mos 02/17/2016, 03/21/2017, 03/01/2018, 03/19/2019, 03/04/2020  ? Influenza,inj,quad, With Preservative 02/28/2018  ? Influenza-Unspecified 03/29/2014, 02/28/2018  ? Tdap 11/12/2013  ?4. Prostate cancer screening- will update PSA. Rectal exam low risk in the past- some BPH. Nocturia at least once a night stable. Daytime urgency and frequency ?Lab Results  ?Component Value Date  ? PSA 0.23 09/30/2020  ? PSA 0.17 03/19/2019  ? PSA 0.21 01/04/2018  ? 5. Colon cancer screening -12/17/15 with 5 year repeat planned due to adenoma 2013-scheduled on June 27 of this year ?6. Skin cancer screening- Dr. Ronnald Ramp has seen him yearly. advised regular sunscreen use. Denies worrisome, changing, or new skin lesions.  ?7. Smoking associated screening (lung cancer screening, AAA screen 65-75, UA)- former smoker- quit 2015. Will get UA.  ?8. STD screening - monogamous with wife- opts out ? ?Status of chronic or acute concerns  ? ?#Hypertension ?S: Compliant with amlodipine 10 mg, metoprolol 100 mg extended release ?BP Readings from Last 3 Encounters:  ?09/15/21 130/70  ?03/16/21 126/76  ?11/16/20 (!) 142/82  ?A/P: Controlled. Continue current medications.  ? ?#Hyperlipidemia ?S: Medication: None ?Lab Results  ?Component Value Date  ? CHOL 170 09/30/2020  ? HDL 41.50 09/30/2020  ? [place] 81 07/28/2014  ? LDLDIRECT 110.0 09/30/2020  ? TRIG 299.0 (H)  09/30/2020  ? CHOLHDL 4 09/30/2020  ? A/P: We will update lipid panel and ASCVD risk-based on last year's labs was at 11%-we discussed option of CT cardiac scoring if still in this range-did have normal exercise tolerance test

## 2021-09-02 ENCOUNTER — Encounter: Payer: Self-pay | Admitting: Gastroenterology

## 2021-09-15 ENCOUNTER — Ambulatory Visit (INDEPENDENT_AMBULATORY_CARE_PROVIDER_SITE_OTHER): Payer: 59 | Admitting: Family Medicine

## 2021-09-15 ENCOUNTER — Encounter: Payer: Self-pay | Admitting: Family Medicine

## 2021-09-15 VITALS — BP 130/70 | HR 73 | Temp 98.2°F | Ht 66.0 in | Wt 176.6 lb

## 2021-09-15 DIAGNOSIS — R739 Hyperglycemia, unspecified: Secondary | ICD-10-CM | POA: Diagnosis not present

## 2021-09-15 DIAGNOSIS — E785 Hyperlipidemia, unspecified: Secondary | ICD-10-CM

## 2021-09-15 DIAGNOSIS — Z125 Encounter for screening for malignant neoplasm of prostate: Secondary | ICD-10-CM | POA: Diagnosis not present

## 2021-09-15 DIAGNOSIS — G4700 Insomnia, unspecified: Secondary | ICD-10-CM | POA: Diagnosis not present

## 2021-09-15 DIAGNOSIS — Z0000 Encounter for general adult medical examination without abnormal findings: Secondary | ICD-10-CM | POA: Diagnosis not present

## 2021-09-15 DIAGNOSIS — Z91014 Allergy to mammalian meats: Secondary | ICD-10-CM

## 2021-09-15 DIAGNOSIS — I10 Essential (primary) hypertension: Secondary | ICD-10-CM | POA: Diagnosis not present

## 2021-09-15 DIAGNOSIS — G43109 Migraine with aura, not intractable, without status migrainosus: Secondary | ICD-10-CM

## 2021-09-15 LAB — CBC WITH DIFFERENTIAL/PLATELET
Basophils Absolute: 0 10*3/uL (ref 0.0–0.1)
Basophils Relative: 0.5 % (ref 0.0–3.0)
Eosinophils Absolute: 0.2 10*3/uL (ref 0.0–0.7)
Eosinophils Relative: 2.7 % (ref 0.0–5.0)
HCT: 52.4 % — ABNORMAL HIGH (ref 39.0–52.0)
Hemoglobin: 17.7 g/dL — ABNORMAL HIGH (ref 13.0–17.0)
Lymphocytes Relative: 27.5 % (ref 12.0–46.0)
Lymphs Abs: 1.6 10*3/uL (ref 0.7–4.0)
MCHC: 33.7 g/dL (ref 30.0–36.0)
MCV: 94.8 fl (ref 78.0–100.0)
Monocytes Absolute: 0.8 10*3/uL (ref 0.1–1.0)
Monocytes Relative: 13.1 % — ABNORMAL HIGH (ref 3.0–12.0)
Neutro Abs: 3.3 10*3/uL (ref 1.4–7.7)
Neutrophils Relative %: 56.2 % (ref 43.0–77.0)
Platelets: 145 10*3/uL — ABNORMAL LOW (ref 150.0–400.0)
RBC: 5.52 Mil/uL (ref 4.22–5.81)
RDW: 12.5 % (ref 11.5–15.5)
WBC: 5.8 10*3/uL (ref 4.0–10.5)

## 2021-09-15 LAB — POC URINALSYSI DIPSTICK (AUTOMATED)
Bilirubin, UA: NEGATIVE
Blood, UA: NEGATIVE
Glucose, UA: NEGATIVE
Ketones, UA: NEGATIVE
Leukocytes, UA: NEGATIVE
Nitrite, UA: NEGATIVE
Protein, UA: NEGATIVE
Spec Grav, UA: 1.015 (ref 1.010–1.025)
Urobilinogen, UA: 0.2 E.U./dL
pH, UA: 6.5 (ref 5.0–8.0)

## 2021-09-15 LAB — COMPREHENSIVE METABOLIC PANEL
ALT: 77 U/L — ABNORMAL HIGH (ref 0–53)
AST: 44 U/L — ABNORMAL HIGH (ref 0–37)
Albumin: 4.9 g/dL (ref 3.5–5.2)
Alkaline Phosphatase: 56 U/L (ref 39–117)
BUN: 13 mg/dL (ref 6–23)
CO2: 27 mEq/L (ref 19–32)
Calcium: 9.8 mg/dL (ref 8.4–10.5)
Chloride: 101 mEq/L (ref 96–112)
Creatinine, Ser: 0.99 mg/dL (ref 0.40–1.50)
GFR: 81.97 mL/min (ref >60.00)
Glucose, Bld: 107 mg/dL — ABNORMAL HIGH (ref 70–99)
Potassium: 4.1 mEq/L (ref 3.5–5.1)
Sodium: 137 mEq/L (ref 135–145)
Total Bilirubin: 0.9 mg/dL (ref 0.2–1.2)
Total Protein: 7.3 g/dL (ref 6.0–8.3)

## 2021-09-15 LAB — HEMOGLOBIN A1C: Hgb A1c MFr Bld: 5.8 % (ref 4.6–6.5)

## 2021-09-15 LAB — LIPID PANEL
Cholesterol: 194 mg/dL (ref 0–200)
HDL: 43.5 mg/dL (ref >39.00)
NonHDL: 150.79
Total CHOL/HDL Ratio: 4
Triglycerides: 260 mg/dL — ABNORMAL HIGH (ref 0.0–149.0)
VLDL: 52 mg/dL — ABNORMAL HIGH (ref 0.0–40.0)

## 2021-09-15 LAB — TSH: TSH: 1.63 u[IU]/mL (ref 0.35–5.50)

## 2021-09-15 LAB — LDL CHOLESTEROL, DIRECT: Direct LDL: 126 mg/dL

## 2021-09-15 LAB — PSA: PSA: 0.17 ng/mL (ref 0.10–4.00)

## 2021-09-15 NOTE — Patient Instructions (Addendum)
Jordan Cantrell with Colonoscopy next month.  ? ?I think pepcid once a day before dinner over the counter would probably help sufficiently for reflux. I am thrilled he has had reassuring cardiac workup  ?  ?Let us know when you get the [facility] vaccine ? ?Please stop by lab before you go ?If you have mychart- we will send your results within 3 business days of [place] receiving them.  ?If you do not have mychart- we will call you about results within 5 business days of [place] receiving them.  ?*please also note that you will see labs on mychart as soon as they post. I will later go in and write notes on them- will say "notes from Dr. Yong Channel"  ? ?Recommended follow up: Return in about 6 months (around 03/18/2022) for followup or sooner if needed.Schedule b4 you leave.  ?

## 2021-09-20 LAB — ALPHA-GAL PANEL
Allergen, Mutton, f88: 0.56 kU/L — ABNORMAL HIGH
Allergen, Pork, f26: 0.13 kU/L — ABNORMAL HIGH
Beef: 0.48 kU/L — ABNORMAL HIGH
CLASS: 1
Class: 1
GALACTOSE-ALPHA-1,3-GALACTOSE IGE*: 6.99 kU/L — ABNORMAL HIGH (ref <0.10)

## 2021-09-20 LAB — INTERPRETATION:

## 2021-10-03 ENCOUNTER — Ambulatory Visit (AMBULATORY_SURGERY_CENTER): Payer: 59 | Admitting: *Deleted

## 2021-10-03 VITALS — Ht 66.0 in | Wt 173.0 lb

## 2021-10-03 DIAGNOSIS — Z86010 Personal history of colonic polyps: Secondary | ICD-10-CM

## 2021-10-03 MED ORDER — NA SULFATE-K SULFATE-MG SULF 17.5-3.13-1.6 GM/177ML PO SOLN: 1.0000 | Freq: Once | ORAL | 0 refills | Status: AC

## 2021-10-03 NOTE — Progress Notes (Signed)

## 2021-11-02 ENCOUNTER — Encounter: Payer: Self-pay | Admitting: Gastroenterology

## 2021-11-03 ENCOUNTER — Telehealth: Payer: Self-pay | Admitting: Gastroenterology

## 2021-11-03 NOTE — Telephone Encounter (Signed)
Called pt- no answer- answering machine identifies pt by first and last name - left message Informed pt we will not cancel his colon due to Voltaren - stop use as of today and keep colon appt 6-27 as scheduled

## 2021-11-03 NOTE — Telephone Encounter (Signed)
Patient called this morning stating he was supposed to stop taking his Voltaren on Monday; however, he has been taking it, with the last time being this morning.  He has only been taking one daily.  He wants to know if he will still be able to do the colonoscopy.  Please call patient and advise.  Thank you.

## 2021-11-08 ENCOUNTER — Ambulatory Visit (AMBULATORY_SURGERY_CENTER): Payer: 59 | Admitting: Gastroenterology

## 2021-11-08 ENCOUNTER — Encounter: Payer: Self-pay | Admitting: Gastroenterology

## 2021-11-08 VITALS — BP 113/66 | HR 70 | Temp 97.8°F | Resp 15 | Ht 66.0 in | Wt 173.0 lb

## 2021-11-08 DIAGNOSIS — Z09 Encounter for follow-up examination after completed treatment for conditions other than malignant neoplasm: Secondary | ICD-10-CM

## 2021-11-08 DIAGNOSIS — K633 Ulcer of intestine: Secondary | ICD-10-CM

## 2021-11-08 DIAGNOSIS — Z86010 Personal history of colonic polyps: Secondary | ICD-10-CM

## 2021-11-08 DIAGNOSIS — K5289 Other specified noninfective gastroenteritis and colitis: Secondary | ICD-10-CM

## 2021-11-08 MED ORDER — SODIUM CHLORIDE 0.9 % IV SOLN: 500.0000 mL | Freq: Once | INTRAVENOUS | Status: DC

## 2021-11-09 ENCOUNTER — Telehealth: Payer: Self-pay

## 2021-11-09 NOTE — Telephone Encounter (Signed)
  Follow up Call-     11/08/2021    7:20 AM  Call back number  Post procedure Call Back phone  # [phone]  Permission to leave phone message Yes     Patient questions:  Do you have a fever, pain , or abdominal swelling? No. Pain Score  0 *  Have you tolerated food without any problems? Yes.    Have you been able to return to your normal activities? Yes.    Do you have any questions about your discharge instructions: Diet   No. Medications  No. Follow up visit  No.  Do you have questions or concerns about your Care? No.  Actions: * If pain score is 4 or above: No action needed, pain <4.

## 2021-11-13 ENCOUNTER — Other Ambulatory Visit: Payer: Self-pay | Admitting: Family Medicine

## 2021-11-20 ENCOUNTER — Encounter: Payer: Self-pay | Admitting: Gastroenterology

## 2021-12-21 ENCOUNTER — Other Ambulatory Visit: Payer: Self-pay | Admitting: Family Medicine

## 2022-02-06 ENCOUNTER — Encounter: Payer: Self-pay | Admitting: *Deleted

## 2022-03-17 ENCOUNTER — Ambulatory Visit: Payer: 59 | Admitting: Family Medicine

## 2022-03-17 ENCOUNTER — Encounter: Payer: Self-pay | Admitting: Family Medicine

## 2022-03-17 VITALS — BP 138/70 | HR 63 | Temp 97.6°F | Ht 66.0 in | Wt 173.4 lb

## 2022-03-17 DIAGNOSIS — R0683 Snoring: Secondary | ICD-10-CM | POA: Diagnosis not present

## 2022-03-17 DIAGNOSIS — D751 Secondary polycythemia: Secondary | ICD-10-CM

## 2022-03-17 DIAGNOSIS — Z91014 Allergy to mammalian meats: Secondary | ICD-10-CM | POA: Diagnosis not present

## 2022-03-17 DIAGNOSIS — E785 Hyperlipidemia, unspecified: Secondary | ICD-10-CM | POA: Diagnosis not present

## 2022-03-17 DIAGNOSIS — I10 Essential (primary) hypertension: Secondary | ICD-10-CM | POA: Diagnosis not present

## 2022-03-17 DIAGNOSIS — R739 Hyperglycemia, unspecified: Secondary | ICD-10-CM

## 2022-03-17 LAB — COMPREHENSIVE METABOLIC PANEL
ALT: 40 U/L (ref 0–53)
AST: 24 U/L (ref 0–37)
Albumin: 4.9 g/dL (ref 3.5–5.2)
Alkaline Phosphatase: 50 U/L (ref 39–117)
BUN: 12 mg/dL (ref 6–23)
CO2: 29 mEq/L (ref 19–32)
Calcium: 9.4 mg/dL (ref 8.4–10.5)
Chloride: 103 mEq/L (ref 96–112)
Creatinine, Ser: 0.92 mg/dL (ref 0.40–1.50)
GFR: 89.2 mL/min (ref >60.00)
Glucose, Bld: 103 mg/dL — ABNORMAL HIGH (ref 70–99)
Potassium: 4.2 mEq/L (ref 3.5–5.1)
Sodium: 141 mEq/L (ref 135–145)
Total Bilirubin: 0.7 mg/dL (ref 0.2–1.2)
Total Protein: 7.3 g/dL (ref 6.0–8.3)

## 2022-03-17 LAB — CBC WITH DIFFERENTIAL/PLATELET
Basophils Absolute: 0 10*3/uL (ref 0.0–0.1)
Basophils Relative: 0.3 % (ref 0.0–3.0)
Eosinophils Absolute: 0.1 10*3/uL (ref 0.0–0.7)
Eosinophils Relative: 1.2 % (ref 0.0–5.0)
HCT: 48.5 % (ref 39.0–52.0)
Hemoglobin: 16.7 g/dL (ref 13.0–17.0)
Lymphocytes Relative: 20.5 % (ref 12.0–46.0)
Lymphs Abs: 1.4 10*3/uL (ref 0.7–4.0)
MCHC: 34.4 g/dL (ref 30.0–36.0)
MCV: 94.5 fl (ref 78.0–100.0)
Monocytes Absolute: 0.8 10*3/uL (ref 0.1–1.0)
Monocytes Relative: 11.9 % (ref 3.0–12.0)
Neutro Abs: 4.4 10*3/uL (ref 1.4–7.7)
Neutrophils Relative %: 66.1 % (ref 43.0–77.0)
Platelets: 152 10*3/uL (ref 150.0–400.0)
RBC: 5.13 Mil/uL (ref 4.22–5.81)
RDW: 12.6 % (ref 11.5–15.5)
WBC: 6.7 10*3/uL (ref 4.0–10.5)

## 2022-03-17 LAB — HEMOGLOBIN A1C: Hgb A1c MFr Bld: 6.1 % (ref 4.6–6.5)

## 2022-03-17 MED ORDER — SUMATRIPTAN SUCCINATE 50 MG PO TABS
ORAL_TABLET | ORAL | 10 refills | Status: AC
Start: 2022-03-17 — End: ?

## 2022-03-17 NOTE — Progress Notes (Signed)
Phone [phone] In person visit   Subjective:   Jordan Cantrell is a 61 y.o. year old very pleasant male patient who presents for/with See problem oriented charting Chief Complaint  Patient presents with   Follow-up   Hypertension   Hyperlipidemia    Past Medical History-  Patient Active Problem List   Diagnosis Date Noted   Allergy to beef 02/17/2016    Priority: High   Tobacco abuse 11/02/2011    Priority: High   Hyperglycemia 03/17/2022    Priority: Medium    Hyperlipidemia, unspecified 09/30/2020    Priority: Medium    Insomnia 11/12/2012    Priority: Medium    Hypertension 11/12/2012    Priority: Medium    Migraine 09/30/2007    Priority: Medium    Osteoarthritis 07/22/2014    Priority: Low   History of colonic polyps 07/22/2014    Priority: Low   Urinary frequency 09/11/2011    Priority: Low   LOW BACK PAIN SYNDROME 09/24/2009    Priority: Low   Dry eyes 09/30/2007    Priority: Low   Allergic rhinitis 09/30/2007    Priority: Low   History of shingles 09/30/2007    Priority: Low   Hemorrhoid 10/30/2014    Medications- reviewed and updated Current Outpatient Medications  Medication Sig Dispense Refill   amLODipine (NORVASC) 10 MG tablet TAKE 1 TABLET BY MOUTH EVERY DAY 90 tablet 3   betamethasone dipropionate 0.05 % cream APPLY SPARINGLY TO AFFECTED AREA TWICE A DAY     diclofenac (VOLTAREN) 75 MG EC tablet diclofenac sodium 75 mg tablet,delayed release  TAKE 1 TABLET BY MOUTH TWICE A DAY     diphenhydrAMINE (BENADRYL) 25 MG tablet Take 25 mg by mouth every 6 (six) hours as needed for itching.     Green Tea 150 MG CAPS      hydrocortisone 2.5 % cream PLEASE SEE ATTACHED FOR DETAILED DIRECTIONS     metoprolol succinate (TOPROL-XL) 50 MG 24 hr tablet TAKE 2 TABLETS BY MOUTH EVERY DAY 180 tablet 3   metroNIDAZOLE (METROGEL) 0.75 % gel Apply topically daily.     Multiple Vitamin (MULTI VITAMIN PO) Take by mouth.     OVER THE COUNTER MEDICATION Take  1,000 mg by mouth daily. Tumeric     pramoxine-hydrocortisone (ANALPRAM HC) cream Apply topically in the morning and at bedtime. 57 g 3   tadalafil (CIALIS) 10 MG tablet Take 1 tablet (10 mg total) by mouth every 3 (three) days as needed for erectile dysfunction. 10 tablet 3   triamcinolone cream (KENALOG) 0.5 % Apply 1 application topically 2 (two) times daily. For 10 days to affected area 30 g 0   SUMAtriptan (IMITREX) 50 MG tablet TAKE 1 TABLET BY MOUTH AS NEEDED MIGRAINE 6 tablet 10   No current facility-administered medications for this visit.     Objective:  BP 138/70   Pulse 63   Temp 97.6 F (36.4 C)   Ht _0  (1.676 m)   Wt 173 lb 6.4 oz (78.7 kg)   SpO2 95%   BMI 27.99 kg/m  Gen: NAD, resting comfortably CV: RRR no murmurs rubs or gallops Lungs: CTAB no crackles, wheeze, rhonchi Abdomen: soft/nontender/nondistended. . No rebound or guarding.  Ext: no edema Skin: warm, dry     Assessment and Plan    #Mild LFT elevations-update with labs today-May need further work-up if trends up - can be over 20 alcoholic beverages per week- he knows needs to pull this back-  max 14 per week. Tough year- aunt and mother in law passed this year- having to clean out their homes.     Latest Ref Rng & Units 09/15/2021   10:52 AM 11/16/2020    4:36 PM 09/30/2020   10:33 AM  Hepatic Function  Total Protein 6.0 - 8.3 g/dL 7.3  7.3  6.7   Albumin 3.5 - 5.2 g/dL 4.9  4.8  4.7   AST 0 - 37 U/L 44  31  25   ALT 0 - 53 U/L 77  47  41   Alk Phosphatase 39 - 117 U/L 56  61  58   Total Bilirubin 0.2 - 1.2 mg/dL 0.9  0.7  0.7     #MSK issues- had seen Dr. Gladstone Lighter for neck and helped 80%. Back bothering him some more. He took tizanidine but caused dry mouth. Gave prednisone for spraing use for stiffness. Was told arthritis in the neck.  -taking diclofenac regularly  #Hypertension S: Compliant with amlodipine 10 mg, metoprolol 100 mg extended release -no recent home checks- when has had  migraines has been low 100s thorugh BP Readings from Last 3 Encounters:  03/17/22 138/70  11/08/21 113/66  09/15/21 130/70  A/P: blood pressure high acceptable range- continue current meds for now    #Hyperlipidemia-normal exercise tolerance test 03/24/2021.   S: Medication: None   CT calcium scoring declined so far-but agrees to today Lab Results  Component Value Date   CHOL 194 09/15/2021   HDL 43.50 09/15/2021   LDLCALC 81 07/28/2014   LDLDIRECT 126.0 09/15/2021   TRIG 260.0 (H) 09/15/2021   CHOLHDL 4 09/15/2021  A/P: mild poor control- get more info with ct calcium scoring  # Alpha gal S:contracted around 2017 we believe. Alpha gal labs have improved but not completely normalized.   A/P: strongly encouraged avoiding beef/pork but he continues to partake and levels remain slightly elevated- no itching or rashes- still advised to not partake- he declines   #Insomnia S: Medication: None-temazepam 15 mg in the past pre Retirement. Sleep2 3-4 hours at a time but does feel rested most of time A/P:  feeling rested- remain off medicine  #Migraines S: Compliant with sumatriptan-uses 1-2 in last 6 months needed a pill daily for 4-5 days but otherwise does well  -cut out cashews and chocolate and helped. Bright light exposure trigger A/P: overall stable other than flares- wife wonders if stress- refill sumatriptan   #Polycythemia-former smoker-has not smoked since 2015 but did  get a lot of smoke exposure at work. Does snore and poor sleep- offered referral for sleep study- was tested years ago- reasonable to recheck Lab Results  Component Value Date   WBC 5.8 09/15/2021   HGB 17.7 (H) 09/15/2021   HCT 52.4 (H) 09/15/2021   MCV 94.8 09/15/2021   PLT 145.0 (L) 09/15/2021   # Hyperglycemia/insulin resistance/prediabetes- a1c 5.19 Sep 2021 S:  Medication: none Lab Results  Component Value Date   HGBA1C 5.8 09/15/2021   A/P: hopefully stable or improved- update a1c today. Advised  healthy eating/regular exercise/mild weight loss- at least active with work  #Tobacco abuse- still dips- former smoker- encouraged cessation . Urine next visit  #Hernia-recurrent right inguinal hernia-follows with Dr. Lorrene Reid December 2023 planned   - easily climbs 2 flights of stairs without chest pain or shortness of breath- would not need further cardiac workup. Plus reassuring stress test 2022.   #Gerd- sparing pepcid very helpful maybe 4x a week   Recommended follow  up: Return in about 6 months (around 09/15/2022) for physical or sooner if needed.Schedule b4 you leave.  Lab/Order associations:   ICD-10-CM   1. Primary hypertension  I10 CBC with Differential/Platelet    Comprehensive metabolic panel    2. Hyperlipidemia, unspecified hyperlipidemia type  E78.5 CT CARDIAC SCORING ([facility] LOCATIONS ONLY)    3. Allergy to beef  Z91.014     4. Snoring  R06.83 Ambulatory referral to Pulmonology    5. Polycythemia  D75.1 Ambulatory referral to Pulmonology    6. Hyperglycemia  R73.9 HgB A1c      Meds ordered this encounter  Medications   SUMAtriptan (IMITREX) 50 MG tablet    Sig: TAKE 1 TABLET BY MOUTH AS NEEDED MIGRAINE    Dispense:  6 tablet    Refill:  10    Return precautions advised.  [name], MD

## 2022-03-17 NOTE — Patient Instructions (Addendum)
Please stop by lab before you go If you have mychart- we will send your results within 3 business days of [place] receiving them.  If you do not have mychart- we will call you about results within 5 business days of [place] receiving them.  *please also note that you will see labs on mychart as soon as they post. I will later go in and write notes on them- will say "notes from Dr. Yong Channel"   We will call you within two weeks about your referral to pulmonology for sleep study. If you do not hear within 2 weeks, give [place] a call.   We will call you within two weeks about your referral to ct calcium scoring through [facility].  Their phone number is [phone].  Please call them if you have not heard in 1-2 weeks   Recommended follow up: Return in about 6 months (around 09/15/2022) for physical or sooner if needed.Schedule b4 you leave.

## 2022-04-05 ENCOUNTER — Ambulatory Visit (INDEPENDENT_AMBULATORY_CARE_PROVIDER_SITE_OTHER): Payer: 59 | Admitting: Primary Care

## 2022-04-05 ENCOUNTER — Encounter: Payer: Self-pay | Admitting: Primary Care

## 2022-04-05 VITALS — BP 124/68 | HR 84 | Temp 98.6°F | Ht 66.0 in | Wt 173.0 lb

## 2022-04-05 DIAGNOSIS — G4700 Insomnia, unspecified: Secondary | ICD-10-CM

## 2022-04-05 DIAGNOSIS — R0683 Snoring: Secondary | ICD-10-CM | POA: Diagnosis not present

## 2022-04-05 NOTE — Patient Instructions (Addendum)
Sleep apnea is defined as period of 10 seconds or longer when you stop breathing at night. This can happen multiple times a night. Dx sleep apnea is when this occurs more than 5 times an hour.    Mild OSA 5-15 apneic events an hour Moderate OSA 15-30 apneic events an hour Severe OSA > 30 apneic events an hour   Untreated sleep apnea puts you at higher risk for cardiac arrhythmias, pulmonary HTN, stroke and diabetes  Treatment options include weight loss, side sleeping position, oral appliance, CPAP therapy or referral to ENT for possible surgical options    Recommendations: Focus on side sleeping position or elevate head with wedge pillow 30 degrees Do not drive if experiencing excessive daytime sleepiness of fatigue  Try magnesium lotion or biofreeze for muscle cramp/nerve pain - we can possibly try gabapentin in the future    Orders: Home sleep study re: snoring    Follow-up: Please call to schedule follow-up 1-2 weeks after completing home sleep study to review results and treatment if needed (can be virtual)  Sleep Apnea Sleep apnea affects breathing during sleep. It causes breathing to stop for 10 seconds or more, or to become shallow. People with sleep apnea usually snore loudly. It can also increase the risk of: Heart attack. Stroke. Being very overweight (obese). Diabetes. Heart failure. Irregular heartbeat. High blood pressure. The goal of treatment is to help you breathe normally again. What are the causes?  The most common cause of this condition is a collapsed or blocked airway. There are three kinds of sleep apnea: Obstructive sleep apnea. This is caused by a blocked or collapsed airway. Central sleep apnea. This happens when the brain does not send the right signals to the muscles that control breathing. Mixed sleep apnea. This is a combination of obstructive and central sleep apnea. What increases the risk? Being overweight. Smoking. Having a small  airway. Being older. Being male. Drinking alcohol. Taking medicines to calm yourself (sedatives or tranquilizers). Having family members with the condition. Having a tongue or tonsils that are larger than normal. What are the signs or symptoms? Trouble staying asleep. Loud snoring. Headaches in the morning. Waking up gasping. Dry mouth or sore throat in the morning. Being sleepy or tired during the day. If you are sleepy or tired during the day, you may also: Not be able to focus your mind (concentrate). Forget things. Get angry a lot and have mood swings. Feel sad (depressed). Have changes in your personality. Have less interest in sex, if you are male. Be unable to have an erection, if you are male. How is this treated?  Sleeping on your side. Using a medicine to get rid of mucus in your nose (decongestant). Avoiding the use of alcohol, medicines to help you relax, or certain pain medicines (narcotics). Losing weight, if needed. Changing your diet. Quitting smoking. Using a machine to open your airway while you sleep, such as: An oral appliance. This is a mouthpiece that shifts your lower jaw forward. A CPAP device. This device blows air through a mask when you breathe out (exhale). An EPAP device. This has valves that you put in each nostril. A BIPAP device. This device blows air through a mask when you breathe in (inhale) and breathe out. Having surgery if other treatments do not work. Follow these instructions at home: Lifestyle Make changes that your doctor recommends. Eat a healthy diet. Lose weight if needed. Avoid alcohol, medicines to help you relax, and some pain  medicines. Do not smoke or use any products that contain nicotine or tobacco. If you need help quitting, ask your doctor. General instructions Take over-the-counter and prescription medicines only as told by your doctor. If you were given a machine to use while you sleep, use it only as told by your  doctor. If you are having surgery, make sure to tell your doctor you have sleep apnea. You may need to bring your device with you. Keep all follow-up visits. Contact a doctor if: The machine that you were given to use during sleep bothers you or does not seem to be working. You do not get better. You get worse. Get help right away if: Your chest hurts. You have trouble breathing in enough air. You have an uncomfortable feeling in your back, arms, or stomach. You have trouble talking. One side of your body feels weak. A part of your face is hanging down. These symptoms may be an emergency. Get help right away. Call your local emergency services (911 in the [place] Do not wait to see if the symptoms will go away. Do not drive yourself to the hospital. Summary This condition affects breathing during sleep. The most common cause is a collapsed or blocked airway. The goal of treatment is to help you breathe normally while you sleep. This information is not intended to replace advice given to you by your health care provider. Make sure you discuss any questions you have with your health care provider. Document Revised: 12/08/2020 Document Reviewed: 04/09/2020 Elsevier Patient Education  [place].   Neuropathic Pain Neuropathic pain is pain caused by damage to the nerves that are responsible for certain sensations in your body (sensory nerves). Neuropathic pain can make you more sensitive to pain. Even a minor sensation can feel very painful. This is usually a long-term (chronic) condition that can be difficult to treat. The type of pain differs from person to person. It may: Start suddenly (acute), or it may develop slowly and become chronic. Come and go as damaged nerves heal, or it may stay at the same level for years. Cause emotional distress, loss of sleep, and a lower quality of life. What are the causes? The most common cause of this condition is diabetes. Many other  diseases and conditions can also cause neuropathic pain. Causes of neuropathic pain can be classified as: Toxic. This is caused by medicines and chemicals. The most common causes of toxic neuropathic pain is damage from medicines that kill cancer cells (chemotherapy) or alcohol abuse. Metabolic. This can be caused by: Diabetes. Lack of vitamins like B12. Traumatic. Any injury that cuts, crushes, or stretches a nerve can cause damage and pain. Compression-related. If a sensory nerve gets trapped or compressed for a long period of time, the blood supply to the nerve can be cut off. Vascular. Many blood vessel diseases can cause neuropathic pain by decreasing blood supply and oxygen to nerves. Autoimmune. This type of pain results from diseases in which the body's defense system (immune system) mistakenly attacks sensory nerves. Examples of autoimmune diseases that can cause neuropathic pain include lupus and multiple sclerosis. Infectious. Many types of viral infections can damage sensory nerves and cause pain. Shingles infection is a common cause of this type of pain. Inherited. Neuropathic pain can be a symptom of many diseases that are passed down through families (genetic). What increases the risk? You are more likely to develop this condition if: You have diabetes. You smoke. You drink too much alcohol.  You are taking certain medicines, including chemotherapy or medicines that treat immune system disorders. What are the signs or symptoms? The main symptom is pain. Neuropathic pain is often described as: Burning. Shock-like. Stinging. Hot or cold. Itching. How is this diagnosed? No single test can diagnose neuropathic pain. It is diagnosed based on: A physical exam and your symptoms. Your health care provider will ask you about your pain. You may be asked to use a pain scale to describe how bad your pain is. Tests. These may be done to see if you have a cause and location of any nerve  damage. They include: Nerve conduction studies and electromyography to test how well nerve signals travel through your nerves and muscles (electrodiagnostic testing). Skin biopsy to evaluate for small fiber neuropathy. Imaging studies, such as: X-rays. CT scan. MRI. How is this treated? Treatment for neuropathic pain may change over time. You may need to try different treatment options or a combination of treatments. Some options include: Treating the underlying cause of the neuropathy, such as diabetes, kidney disease, or vitamin deficiencies. Stopping medicines that can cause neuropathy, such as chemotherapy. Medicine to relieve pain. Medicines may include: Prescription or over-the-counter pain medicine. Anti-seizure medicine. Antidepressant medicines. Pain-relieving patches or creams that are applied to painful areas of skin. A medicine to numb the area (local anesthetic), which can be injected as a nerve block. Transcutaneous nerve stimulation. This uses electrical currents to block painful nerve signals. The treatment is painless. Alternative treatments, such as: Acupuncture. Meditation. Massage. Occupational or physical therapy. Pain management programs. Counseling. Follow these instructions at home: Medicines  Take over-the-counter and prescription medicines only as told by your health care provider. Ask your health care provider if the medicine prescribed to you: Requires you to avoid driving or using machinery. Can cause constipation. You may need to take these actions to prevent or treat constipation: Drink enough fluid to keep your urine pale yellow. Take over-the-counter or prescription medicines. Eat foods that are high in fiber, such as beans, whole grains, and fresh fruits and vegetables. Limit foods that are high in fat and processed sugars, such as fried or sweet foods. Lifestyle  Have a good support system at home. Consider joining a chronic pain support  group. Do not use any products that contain nicotine or tobacco. These products include cigarettes, chewing tobacco, and vaping devices, such as e-cigarettes. If you need help quitting, ask your health care provider. Do not drink alcohol. General instructions Learn as much as you can about your condition. Work closely with all your health care providers to find the treatment plan that works best for you. Ask your health care provider what activities are safe for you. Keep all follow-up visits. This is important. Contact a health care provider if: Your pain treatments are not working. You are having side effects from your medicines. You are struggling with tiredness (fatigue), mood changes, depression, or anxiety. Get help right away if: You have thoughts of hurting yourself. Get help right away if you feel like you may hurt yourself or others, or have thoughts about taking your own life. Go to your nearest emergency room or: Call 911. Call the [place] at ([phone] or 988. This is open 24 hours a day. Text the Crisis Text Line at (832) 606-5690. Summary Neuropathic pain is pain caused by damage to the nerves that are responsible for certain sensations in your body (sensory nerves). Neuropathic pain may come and go as damaged nerves heal, or  it may stay at the same level for years. Neuropathic pain is usually a long-term condition that can be difficult to treat. Consider joining a chronic pain support group. This information is not intended to replace advice given to you by your health care provider. Make sure you discuss any questions you have with your health care provider. Document Revised: 12/27/2020 Document Reviewed: 12/27/2020 Elsevier Patient Education  [place].

## 2022-04-05 NOTE — Progress Notes (Signed)
$'@Patient'B$  ID: Jordan Cantrell, male    DOB: March 30, 1961, 61 y.o.   MRN: [id number]  Chief Complaint  Patient presents with   Consult    Referring provider: Marin Olp, MD  HPI: 61 year old male, former light smoker quit in 2015 (uses snuff).  Past medical history significant for hypertension, allergic rhinitis, osteoarthritis, hyperlipidemia, insomnia, urinary frequency, tobacco abuse.  04/05/2022  Patient presents today for sleep consult. Patient has symptoms of insomnia and disrupted sleep.  His wife has told him that he snores.  He has a history of sleep apnea but never been on CPAP.  Sleep study was greater than 60 years old, results are not in chart for review.  He works as a Conservation officer, historic buildings. He has worked third shift his whole career, typical hours were form 11pm-8am. He has taken sleep aids in the past. Tried melatonin and temazepam.  He takes over the counter medication called deep sleep 1 hour prior to bedtime which seems to help maintain sleep better.  Typical bedtime is between 9 and 10 PM.  It takes him on average 10 to 30 minutes to fall asleep.  He wakes up 3-5 times a night.  He will start his day between 430 and 5:30 AM.  He gets woken up in the morning by leg camps and also describes burning sensation in his feet at night.  He falls asleep easily when in active more so in the afternoon.  Epworth score 13.  Denies symptoms of narcolepsy, cataplexy or sleepwalking.  Sleep questionnaire Symptoms-  Waking up at night  Prior sleep study- > 10 years  Bedtime- 9-10pm  Time to fall asleep- 10-76mns  Nocturnal awakenings- 3-5 times  Out of bed/start of day- 4:30-5:30am  Weight changes- stable  Do you operate heavy machinery- no  Do you currently wear CPAP- no Do you current wear oxygen- no Epworth- 13   Allergies  Allergen Reactions   Codeine Nausea Only    Immunization History  Administered Date(s) Administered   Influenza,inj,Quad PF,6+ Mos 02/17/2016,  03/21/2017, 03/01/2018, 03/19/2019, 03/04/2020   Influenza,inj,quad, With Preservative 02/28/2018   Influenza-Unspecified 03/29/2014, 02/28/2018   Tdap 11/12/2013    Past Medical History:  Diagnosis Date   Allergic rhinitis, cause unspecified    to tick bite - resolved   Arthritis    knees, hands   Balanoposthitis    Mild   Chest pain, unspecified    2007- stress test normal   Constipation 09/24/2009   Prn metamucil  - occasional   Dry eye syndrome    GERD (gastroesophageal reflux disease)    Hearing loss    slight - bilateral - no hearing aids   HEMORRHOID, EXTERNAL, THROMBOSED 05/06/2010   Qualifier: Diagnosis of  By: JRonnald RampMD, TArvid Right    History of shingles    Hypertension    Migraine, unspecified, without mention of intractable migraine without mention of status migrainosus    TINNITUS NOS 01/31/2008   Qualifier: Diagnosis of  By: Plotnikov MD, AEvie Lacks    Tobacco History: Social History   Tobacco Use  Smoking Status Former   Packs/day: 0.10   Types: Cigarettes   Quit date: 04/28/2014   Years since quitting: 7.9   Passive exposure: Past ("PARENTS SMOKED")  Smokeless Tobacco Current   Types: Snuff   Ready to quit: Not Answered Counseling given: Not Answered   Outpatient Medications Prior to Visit  Medication Sig Dispense Refill   amLODipine (NORVASC) 10 MG tablet TAKE 1 TABLET  BY MOUTH EVERY DAY 90 tablet 3   betamethasone dipropionate 0.05 % cream APPLY SPARINGLY TO AFFECTED AREA TWICE A DAY     diclofenac (VOLTAREN) 75 MG EC tablet diclofenac sodium 75 mg tablet,delayed release  TAKE 1 TABLET BY MOUTH TWICE A DAY     diphenhydrAMINE (BENADRYL) 25 MG tablet Take 25 mg by mouth every 6 (six) hours as needed for itching.     Green Tea 150 MG CAPS      hydrocortisone 2.5 % cream PLEASE SEE ATTACHED FOR DETAILED DIRECTIONS     metoprolol succinate (TOPROL-XL) 50 MG 24 hr tablet TAKE 2 TABLETS BY MOUTH EVERY DAY 180 tablet 3   metroNIDAZOLE (METROGEL) 0.75  % gel Apply topically daily.     Multiple Vitamin (MULTI VITAMIN PO) Take by mouth.     OVER THE COUNTER MEDICATION Take 1,000 mg by mouth daily. Tumeric     pramoxine-hydrocortisone (ANALPRAM HC) cream Apply topically in the morning and at bedtime. 57 g 3   SUMAtriptan (IMITREX) 50 MG tablet TAKE 1 TABLET BY MOUTH AS NEEDED MIGRAINE 6 tablet 10   tadalafil (CIALIS) 10 MG tablet Take 1 tablet (10 mg total) by mouth every 3 (three) days as needed for erectile dysfunction. 10 tablet 3   triamcinolone cream (KENALOG) 0.5 % Apply 1 application topically 2 (two) times daily. For 10 days to affected area 30 g 0   No facility-administered medications prior to visit.    Review of Systems  Review of Systems  Constitutional:  Positive for fatigue.  Respiratory: Negative.    Psychiatric/Behavioral:  Positive for sleep disturbance.     Physical Exam  BP 124/68 (BP Location: Left Arm, Patient Position: Sitting, Cuff Size: Normal)   Pulse 84   Temp 98.6 F (37 C) (Oral)   Ht '5\' 6"'$  (1.676 m)   Wt 173 lb (78.5 kg)   SpO2 95%   BMI 27.92 kg/m  Physical Exam Constitutional:      Appearance: Normal appearance.  HENT:     Head: Normocephalic and atraumatic.     Mouth/Throat:     Mouth: Mucous membranes are moist.     Pharynx: Oropharynx is clear.     Comments: Mallampati class I Cardiovascular:     Rate and Rhythm: Normal rate and regular rhythm.  Pulmonary:     Effort: Pulmonary effort is normal.     Breath sounds: Normal breath sounds. No wheezing, rhonchi or rales.  Musculoskeletal:        General: Normal range of motion.  Skin:    General: Skin is warm and dry.  Neurological:     General: No focal deficit present.     Mental Status: He is alert and oriented to person, place, and time. Mental status is at baseline.  Psychiatric:        Mood and Affect: Mood normal.        Behavior: Behavior normal.        Thought Content: Thought content normal.        Judgment: Judgment normal.       Lab Results:  CBC    Component Value Date/Time   WBC 6.7 03/17/2022 0939   RBC 5.13 03/17/2022 0939   HGB 16.7 03/17/2022 0939   HCT 48.5 03/17/2022 0939   PLT 152.0 03/17/2022 0939   MCV 94.5 03/17/2022 0939   MCHC 34.4 03/17/2022 0939   RDW 12.6 03/17/2022 0939   LYMPHSABS 1.4 03/17/2022 0939   MONOABS 0.8 03/17/2022 0939  EOSABS 0.1 03/17/2022 0939   BASOSABS 0.0 03/17/2022 0939    BMET    Component Value Date/Time   NA 141 03/17/2022 0939   K 4.2 03/17/2022 0939   CL 103 03/17/2022 0939   CO2 29 03/17/2022 0939   GLUCOSE 103 (H) 03/17/2022 0939   BUN 12 03/17/2022 0939   CREATININE 0.92 03/17/2022 0939   CALCIUM 9.4 03/17/2022 0939   GFRNONAA 83.12 09/21/2009 0847    BNP No results found for: "BNP"  ProBNP No results found for: "PROBNP"  Imaging: No results found.   Assessment & Plan:   Loud snoring - Hx sleep apnea, never on CPAP. Sleep study > 10 years- results are not in chart. Patient has symptoms of loud snoring and disrupted sleep, associated daytime sleepiness. Epworth score 13. Concern patient has underlying obstructive sleep apnea, needs home sleep study to evaluate.  We reviewed risks of untreated sleep apnea including cardiac events, pulmonary hypertension, diabetes and stroke.  We discussed treatment options including weight loss, oral appliance, CPAP therapy or referral to ENT for possible surgical options.  Patient is hesitant to be started on CPAP, he does not think he will tolerate.  He may potentially be open to at least trying PAP therapy before considering alternative treatment options such as Inspire device.  Encourage side sleeping position.  Advised against driving if experiencing excessive daytime sleepiness fatigue.  Follow-up 1 to 2 weeks after sleep study review results and treatment options as needed.  Insomnia - Difficulty maintaining sleep, waking up 3-4 times a night. Sleep disruption likely from underlying OSA. Needs repeat  sleep testing. Ok to continue over the counter sleep aid. May benefit from gabapentin at bedtime for peripheral nerve pain.    Martyn Ehrich, NP 04/05/2022

## 2022-04-05 NOTE — Assessment & Plan Note (Signed)
-   Difficulty maintaining sleep, waking up 3-4 times a night. Sleep disruption likely from underlying OSA. Needs repeat sleep testing. Ok to continue over the counter sleep aid. May benefit from gabapentin at bedtime for peripheral nerve pain.

## 2022-04-05 NOTE — Assessment & Plan Note (Signed)
-   Hx sleep apnea, never on CPAP. Sleep study > 10 years- results are not in chart. Patient has symptoms of loud snoring and disrupted sleep, associated daytime sleepiness. Epworth score 13. Concern patient has underlying obstructive sleep apnea, needs home sleep study to evaluate.  We reviewed risks of untreated sleep apnea including cardiac events, pulmonary hypertension, diabetes and stroke.  We discussed treatment options including weight loss, oral appliance, CPAP therapy or referral to ENT for possible surgical options.  Patient is hesitant to be started on CPAP, he does not think he will tolerate.  He may potentially be open to at least trying PAP therapy before considering alternative treatment options such as Inspire device.  Encourage side sleeping position.  Advised against driving if experiencing excessive daytime sleepiness fatigue.  Follow-up 1 to 2 weeks after sleep study review results and treatment options as needed.

## 2022-04-06 NOTE — Progress Notes (Signed)
Reviewed and agree with assessment/plan.   Charae Depaolis, MD [facility] 04/06/2022, 8:15 PM Pager:  [phone]  

## 2022-05-01 ENCOUNTER — Other Ambulatory Visit: Payer: 59

## 2022-06-26 ENCOUNTER — Ambulatory Visit: Payer: 59

## 2022-06-26 DIAGNOSIS — G4733 Obstructive sleep apnea (adult) (pediatric): Secondary | ICD-10-CM

## 2022-06-26 DIAGNOSIS — R0683 Snoring: Secondary | ICD-10-CM

## 2022-06-29 DIAGNOSIS — G4733 Obstructive sleep apnea (adult) (pediatric): Secondary | ICD-10-CM | POA: Diagnosis not present

## 2022-07-03 ENCOUNTER — Telehealth: Payer: Self-pay | Admitting: Primary Care

## 2022-07-03 NOTE — Telephone Encounter (Signed)
HST 06/26/22 showed severe OSA, AHI 36/hour.  Needs visit in person or virtual to review results and treatment

## 2022-07-03 NOTE — Telephone Encounter (Signed)
Called and spoke with pt letting him know the results of HST and recs per BW and he verbalized understanding. Appt scheduled for pt. Nothing further needed.

## 2022-07-07 ENCOUNTER — Encounter: Payer: Self-pay | Admitting: Primary Care

## 2022-07-07 ENCOUNTER — Ambulatory Visit: Payer: 59 | Admitting: Primary Care

## 2022-07-07 VITALS — BP 124/80 | HR 76 | Ht 66.0 in | Wt 175.2 lb

## 2022-07-07 DIAGNOSIS — H9313 Tinnitus, bilateral: Secondary | ICD-10-CM | POA: Diagnosis not present

## 2022-07-07 DIAGNOSIS — G4733 Obstructive sleep apnea (adult) (pediatric): Secondary | ICD-10-CM | POA: Diagnosis not present

## 2022-07-07 DIAGNOSIS — G4730 Sleep apnea, unspecified: Secondary | ICD-10-CM

## 2022-07-07 HISTORY — DX: Sleep apnea, unspecified: G47.30

## 2022-07-07 NOTE — Patient Instructions (Addendum)
Home sleep study showed evidence of severe obstructive sleep apnea, you had on average 36 apneas an hour the lowest your oxygen went was 85% with an average of 93%.  Due to the severity of your sleep apnea recommend you be started on CPAP.  If unable to tolerate we can always consider either an oral appliance or referring you to ENT for possible inspire  Recommendations: Once you get CPAP you need to aim to wear every night for minimum 4-6 hours   Orders Auto CPAP 5 to 15 cm H2O with mask of choice  Referral ENT re: tinnitus   Follow-up: 2 months with Beth NP    CPAP and BIPAP Information CPAP and BIPAP are methods that use air pressure to keep your airways open and to help you breathe well. CPAP and BIPAP use different amounts of pressure. Your health care provider will tell you whether CPAP or BIPAP would be more helpful for you. CPAP stands for "continuous positive airway pressure." With CPAP, the amount of pressure stays the same while you breathe in (inhale) and out (exhale). BIPAP stands for "bi-level positive airway pressure." With BIPAP, the amount of pressure will be higher when you inhale and lower when you exhale. This allows you to take larger breaths. CPAP or BIPAP may be used in the hospital, or your health care provider may want you to use it at home. You may need to have a sleep study before your health care provider can order a machine for you to use at home. What are the advantages? CPAP or BIPAP can be helpful if you have: Sleep apnea. Chronic obstructive pulmonary disease (COPD). Heart failure. Medical conditions that cause muscle weakness, including muscular dystrophy or amyotrophic lateral sclerosis (ALS). Other problems that cause breathing to be shallow, weak, abnormal, or difficult. CPAP and BIPAP are most commonly used for obstructive sleep apnea (OSA) to keep the airways from collapsing when the muscles relax during sleep. What are the risks? Generally, this is  a safe treatment. However, problems may occur, including: Irritated skin or skin sores if the mask does not fit properly. Dry or stuffy nose or nosebleeds. Dry mouth. Feeling gassy or bloated. Sinus or lung infection if the equipment is not cleaned properly. When should CPAP or BIPAP be used? In most cases, the mask only needs to be worn during sleep. Generally, the mask needs to be worn throughout the night and during any daytime naps. People with certain medical conditions may also need to wear the mask at other times, such as when they are awake. Follow instructions from your health care provider about when to use the machine. What happens during CPAP or BIPAP?  Both CPAP and BIPAP are provided by a small machine with a flexible plastic tube that attaches to a plastic mask that you wear. Air is blown through the mask into your nose or mouth. The amount of pressure that is used to blow the air can be adjusted on the machine. Your health care provider will set the pressure setting and help you find the best mask for you. Tips for using the mask Because the mask needs to be snug, some people feel trapped or closed-in (claustrophobic) when first using the mask. If you feel this way, you may need to get used to the mask. One way to do this is to hold the mask loosely over your nose or mouth and then gradually apply the mask more snugly. You can also gradually increase the amount  of time that you use the mask. Masks are available in various types and sizes. If your mask does not fit well, talk with your health care provider about getting a different one. Some common types of masks include: Full face masks, which fit over the mouth and nose. Nasal masks, which fit over the nose. Nasal pillow or prong masks, which fit into the nostrils. If you are using a mask that fits over your nose and you tend to breathe through your mouth, a chin strap may be applied to help keep your mouth closed. Use a skin  barrier to protect your skin as told by your health care provider. Some CPAP and BIPAP machines have alarms that may sound if the mask comes off or develops a leak. If you have trouble with the mask, it is very important that you talk with your health care provider about finding a way to make the mask easier to tolerate. Do not stop using the mask. There could be a negative impact on your health if you stop using the mask. Tips for using the machine Place your CPAP or BIPAP machine on a secure table or stand near an electrical outlet. Know where the on/off switch is on the machine. Follow instructions from your health care provider about how to set the pressure on your machine and when you should use it. Do not eat or drink while the CPAP or BIPAP machine is on. Food or fluids could get pushed into your lungs by the pressure of the CPAP or BIPAP. For home use, CPAP and BIPAP machines can be rented or purchased through home health care companies. Many different brands of machines are available. Renting a machine before purchasing may help you find out which particular machine works well for you. Your health insurance company may also decide which machine you may get. Keep the CPAP or BIPAP machine and attachments clean. Ask your health care provider for specific instructions. Check the humidifier if you have a dry stuffy nose or nosebleeds. Make sure it is working correctly. Follow these instructions at home: Take over-the-counter and prescription medicines only as told by your health care provider. Ask if you can take sinus medicine if your sinuses are blocked. Do not use any products that contain nicotine or tobacco. These products include cigarettes, chewing tobacco, and vaping devices, such as e-cigarettes. If you need help quitting, ask your health care provider. Keep all follow-up visits. This is important. Contact a health care provider if: You have redness or pressure sores on your head, face,  mouth, or nose from the mask or head gear. You have trouble using the CPAP or BIPAP machine. You cannot tolerate wearing the CPAP or BIPAP mask. Someone tells you that you snore even when wearing your CPAP or BIPAP. Get help right away if: You have trouble breathing. You feel confused. Summary CPAP and BIPAP are methods that use air pressure to keep your airways open and to help you breathe well. If you have trouble with the mask, it is very important that you talk with your health care provider about finding a way to make the mask easier to tolerate. Do not stop using the mask. There could be a negative impact to your health if you stop using the mask. Follow instructions from your health care provider about when to use the machine. This information is not intended to replace advice given to you by your health care provider. Make sure you discuss any questions you have  with your health care provider. Document Revised: 12/08/2020 Document Reviewed: 04/09/2020 Elsevier Patient Education  [place].

## 2022-07-07 NOTE — Assessment & Plan Note (Signed)
Refer to ENT 

## 2022-07-07 NOTE — Progress Notes (Signed)
$'@Patient'x$  ID: Dub Mikes, male    DOB: Dec 03, 1960, 62 y.o.   MRN: [id number]  Chief Complaint  Patient presents with   Follow-up    HST 06/26/2022    Referring provider: Marin Olp, MD  HPI: 62 year old male, former light smoker quit in 2015 (uses snuff).  Past medical history significant for hypertension, allergic rhinitis, osteoarthritis, hyperlipidemia, insomnia, urinary frequency, tobacco abuse.  Previous [facility] pulmonary encounter:  04/05/2022  Patient presents today for sleep consult. Patient has symptoms of insomnia and disrupted sleep.  His wife has told him that he snores.  He has a history of sleep apnea but never been on CPAP.  Sleep study was greater than 33 years old, results are not in chart for review.  He works as a Conservation officer, historic buildings. He has worked third shift his whole career, typical hours were form 11pm-8am. He has taken sleep aids in the past. Tried melatonin and temazepam.  He takes over the counter medication called deep sleep 1 hour prior to bedtime which seems to help maintain sleep better.  Typical bedtime is between 9 and 10 PM.  It takes him on average 10 to 30 minutes to fall asleep.  He wakes up 3-5 times a night.  He will start his day between 430 and 5:30 AM.  He gets woken up in the morning by leg camps and also describes burning sensation in his feet at night.  He falls asleep easily when inactive more so in the afternoon.  Epworth score 13.  Denies symptoms of narcolepsy, cataplexy or sleepwalking.  Sleep questionnaire Symptoms-  Waking up at night  Prior sleep study- > 10 years  Bedtime- 9-10pm  Time to fall asleep- 10-14mns  Nocturnal awakenings- 3-5 times  Out of bed/start of day- 4:30-5:30am  Weight changes- stable  Do you operate heavy machinery- no  Do you currently wear CPAP- no Do you current wear oxygen- no Epworth- 13    07/07/2022 Patient presents today to review home sleep study.  Patient had HST on 06/26/2022 that showed  evidence of severe obstructive sleep apnea, AHI 36.1 an hour with SpO2 low 85% (average 93%).  We discussed risks of untreated sleep apnea and treatment options.  Patient is hesitant to start CPAP but willing to try.  He is unsure he is can be able to tolerate having something on his face at night while sleeping. He also reports having symptoms of tinnitus, we discussed seeing an ear nose and throat doctor to evaluate.    Allergies  Allergen Reactions   Codeine Nausea Only    Immunization History  Administered Date(s) Administered   Influenza,inj,Quad PF,6+ Mos 02/17/2016, 03/21/2017, 03/01/2018, 03/19/2019, 03/04/2020   Influenza,inj,quad, With Preservative 02/28/2018   Influenza-Unspecified 03/29/2014, 02/28/2018   Tdap 11/12/2013    Past Medical History:  Diagnosis Date   Allergic rhinitis, cause unspecified    to tick bite - resolved   Arthritis    knees, hands   Balanoposthitis    Mild   Chest pain, unspecified    2007- stress test normal   Constipation 09/24/2009   Prn metamucil  - occasional   Dry eye syndrome    GERD (gastroesophageal reflux disease)    Hearing loss    slight - bilateral - no hearing aids   HEMORRHOID, EXTERNAL, THROMBOSED 05/06/2010   Qualifier: Diagnosis of  By: JRonnald RampMD, TArvid Right    History of shingles    Hypertension    Migraine, unspecified, without mention of intractable  migraine without mention of status migrainosus    Severe sleep apnea 07/07/2022   TINNITUS NOS 01/31/2008   Qualifier: Diagnosis of  By: Plotnikov MD, Evie Lacks     Tobacco History: Social History   Tobacco Use  Smoking Status Former   Packs/day: 0.10   Types: Cigarettes   Quit date: 04/28/2014   Years since quitting: 8.1   Passive exposure: Past ("PARENTS SMOKED")  Smokeless Tobacco Current   Types: Snuff   Ready to quit: Not Answered Counseling given: Not Answered   Outpatient Medications Prior to Visit  Medication Sig Dispense Refill   amLODipine (NORVASC)  10 MG tablet TAKE 1 TABLET BY MOUTH EVERY DAY 90 tablet 3   betamethasone dipropionate 0.05 % cream APPLY SPARINGLY TO AFFECTED AREA TWICE A DAY     diclofenac (VOLTAREN) 75 MG EC tablet diclofenac sodium 75 mg tablet,delayed release  TAKE 1 TABLET BY MOUTH TWICE A DAY     diphenhydrAMINE (BENADRYL) 25 MG tablet Take 25 mg by mouth every 6 (six) hours as needed for itching.     Green Tea 150 MG CAPS      hydrocortisone 2.5 % cream PLEASE SEE ATTACHED FOR DETAILED DIRECTIONS     metoprolol succinate (TOPROL-XL) 50 MG 24 hr tablet TAKE 2 TABLETS BY MOUTH EVERY DAY 180 tablet 3   metroNIDAZOLE (METROGEL) 0.75 % gel Apply topically daily.     Multiple Vitamin (MULTI VITAMIN PO) Take by mouth.     OVER THE COUNTER MEDICATION Take 1,000 mg by mouth daily. Tumeric     pramoxine-hydrocortisone (ANALPRAM HC) cream Apply topically in the morning and at bedtime. 57 g 3   SUMAtriptan (IMITREX) 50 MG tablet TAKE 1 TABLET BY MOUTH AS NEEDED MIGRAINE 6 tablet 10   tadalafil (CIALIS) 10 MG tablet Take 1 tablet (10 mg total) by mouth every 3 (three) days as needed for erectile dysfunction. 10 tablet 3   triamcinolone cream (KENALOG) 0.5 % Apply 1 application topically 2 (two) times daily. For 10 days to affected area 30 g 0   No facility-administered medications prior to visit.   Review of Systems  Review of Systems  Constitutional: Negative.   HENT:  Positive for tinnitus.   Respiratory: Negative.    Cardiovascular: Negative.    Physical Exam  BP 124/80 (BP Location: Left Arm, Patient Position: Sitting, Cuff Size: Normal)   Pulse 76   Ht '5\' 6"'$  (1.676 m)   Wt 175 lb 3.2 oz (79.5 kg)   SpO2 100%   BMI 28.28 kg/m  Physical Exam Constitutional:      Appearance: Normal appearance.  HENT:     Head: Normocephalic and atraumatic.     Right Ear: Tympanic membrane normal. There is no impacted cerumen.     Left Ear: Tympanic membrane normal. There is no impacted cerumen.     Nose: No congestion.      Mouth/Throat:     Mouth: Mucous membranes are moist.     Pharynx: Oropharynx is clear.     Comments: Mallampati class I Cardiovascular:     Rate and Rhythm: Normal rate and regular rhythm.  Pulmonary:     Effort: Pulmonary effort is normal.     Breath sounds: Normal breath sounds.  Skin:    General: Skin is warm and dry.  Neurological:     General: No focal deficit present.     Mental Status: He is alert and oriented to person, place, and time. Mental status is at baseline.  Psychiatric:        Mood and Affect: Mood normal.        Behavior: Behavior normal.        Thought Content: Thought content normal.        Judgment: Judgment normal.      Lab Results:  CBC    Component Value Date/Time   WBC 6.7 03/17/2022 0939   RBC 5.13 03/17/2022 0939   HGB 16.7 03/17/2022 0939   HCT 48.5 03/17/2022 0939   PLT 152.0 03/17/2022 0939   MCV 94.5 03/17/2022 0939   MCHC 34.4 03/17/2022 0939   RDW 12.6 03/17/2022 0939   LYMPHSABS 1.4 03/17/2022 0939   MONOABS 0.8 03/17/2022 0939   EOSABS 0.1 03/17/2022 0939   BASOSABS 0.0 03/17/2022 0939    BMET    Component Value Date/Time   NA 141 03/17/2022 0939   K 4.2 03/17/2022 0939   CL 103 03/17/2022 0939   CO2 29 03/17/2022 0939   GLUCOSE 103 (H) 03/17/2022 0939   BUN 12 03/17/2022 0939   CREATININE 0.92 03/17/2022 0939   CALCIUM 9.4 03/17/2022 0939   GFRNONAA 83.12 09/21/2009 0847    BNP No results found for: "BNP"  ProBNP No results found for: "PROBNP"  Imaging: No results found.   Assessment & Plan:   Severe sleep apnea - Hx sleep apnea, never tried CPAP. HST on 06/26/22 that showed evidence of severe OSA, AHI 36/hour with SpO2 low 85%.  We reviewed risks of untreated sleep apnea including cardiac arrhythmias, pulmonary hypertension, diabetes and stroke.  We also discussed treatment options including weight loss, oral appliance, CPAP therapy or referral to ENT for possible surgical options.  If his sleep apnea recommend  patient be started on auto CPAP.  Patient is hesitant to start CPAP but is open to trying.  If unable to tolerate consider oral appliance or hypoglossal nerve stimulator.  Advised patient aim to wear CPAP every night for minimum 4 to 6 hours or longer.  Encourage patient focus on side sleeping position.  Follow-up in 31 to 90 days for CPAP compliance.   Tinnitus, bilateral - Refer to ENT   Martyn Ehrich, NP 07/07/2022

## 2022-07-07 NOTE — Progress Notes (Signed)
Reviewed and agree with assessment/plan.   Chesley Mires, MD [facility] Pulmonary/Critical Care 07/07/2022, 10:50 AM Pager:  [phone]

## 2022-07-07 NOTE — Assessment & Plan Note (Signed)
-   Hx sleep apnea, never tried CPAP. HST on 06/26/22 that showed evidence of severe OSA, AHI 36/hour with SpO2 low 85%.  We reviewed risks of untreated sleep apnea including cardiac arrhythmias, pulmonary hypertension, diabetes and stroke.  We also discussed treatment options including weight loss, oral appliance, CPAP therapy or referral to ENT for possible surgical options.  If his sleep apnea recommend patient be started on auto CPAP.  Patient is hesitant to start CPAP but is open to trying.  If unable to tolerate consider oral appliance or hypoglossal nerve stimulator.  Advised patient aim to wear CPAP every night for minimum 4 to 6 hours or longer.  Encourage patient focus on side sleeping position.  Follow-up in 31 to 90 days for CPAP compliance.

## 2022-07-25 ENCOUNTER — Ambulatory Visit
Admission: RE | Admit: 2022-07-25 | Discharge: 2022-07-25 | Disposition: A | Discharge status: Home / Self Care | Payer: 59 | Source: Ambulatory Visit | Attending: Family Medicine | Admitting: Family Medicine

## 2022-07-25 DIAGNOSIS — E785 Hyperlipidemia, unspecified: Secondary | ICD-10-CM

## 2022-07-26 ENCOUNTER — Encounter: Payer: Self-pay | Admitting: Family Medicine

## 2022-07-26 DIAGNOSIS — R931 Abnormal findings on diagnostic imaging of heart and coronary circulation: Secondary | ICD-10-CM | POA: Insufficient documentation

## 2022-07-26 DIAGNOSIS — I700 Atherosclerosis of aorta: Secondary | ICD-10-CM | POA: Insufficient documentation

## 2022-07-27 ENCOUNTER — Other Ambulatory Visit: Payer: Self-pay

## 2022-07-27 DIAGNOSIS — I2510 Atherosclerotic heart disease of native coronary artery without angina pectoris: Secondary | ICD-10-CM

## 2022-07-27 MED ORDER — ROSUVASTATIN CALCIUM 20 MG PO TABS: 20.0000 mg | ORAL_TABLET | Freq: Every day | ORAL | 3 refills | Status: DC

## 2022-07-28 ENCOUNTER — Telehealth: Payer: Self-pay | Admitting: Cardiovascular Disease

## 2022-07-28 NOTE — Telephone Encounter (Signed)
Patient had a noraml ETT on 03/24/21, and was PRN follow-up at last office visit. Patient had recent CT calcium sore on 07/25/22, and his PCP Dr. Yong Channel started him on rosuvastatin 20 mg due to the results. Patient is worried about taking medications and having to deal with side effects. Patient was waning to  get Dr. Kyla Balzarine take on if he really needs to take the cholesterol medication.

## 2022-07-28 NOTE — Telephone Encounter (Signed)
Pt c/o medication issue:  1. Name of Medication: rosuvastatin (CRESTOR) 20 MG tablet   2. How are you currently taking this medication (dosage and times per day)? Has not started yet  3. Are you having a reaction (difficulty breathing--STAT)? no  4. What is your medication issue? Patient was prescribed Rosuvastatin by his PCP but he wants to hear what this cardiologist has to say regarding starting this medication. He is scheduled for an appt on 04/01 at 10:55am with Christen Bame, NP but wants to know before then. Please advise.

## 2022-07-31 NOTE — Telephone Encounter (Signed)
Per Dr. Johnsie Cancel, Calcium score high for age should take statin and have f/u ETT in 2 years. Patient verbalized understanding.

## 2022-08-09 NOTE — Progress Notes (Signed)
Cardiology Office Note:    Date:  08/14/2022   ID:  Jordan Cantrell, DOB [birth year], MRN [id number]  PCP:  Marin Olp, MD   [facility] Providers Cardiologist:  None     Referring MD: Marin Olp, MD   Chief Complaint: elevated coronary artery calcium score  History of Present Illness:    Jordan Cantrell is a very pleasant 62 y.o. male with a hx of elevated coronary calcium score, OSA, HTN, former tobacco use, polycythemia, and OSA on CPAP.   Referred to cardiology by PCP for evaluation of chest pain and seen by Dr. Johnsie Cancel 03/16/2021. He quit smoking in 2015. History of alpha gal, migraines, polycythemia and osteo arthritis in his knees. Reported chest pain 11/2020 for several months. Mostly sharp pain at rest, lasting only a few seconds and resolving on its own. Cardiac monitor revealed NSR with average HR 75 bpm, rare PACs/PVCs.  History of poor R wave progression on EKG no change from 2017.  Retired from Librarian, academic. Pain mostly at rest, random, and relieved with belching.  ETT ordered for evaluation of chest pain, revealed normal results. He was advised to follow-up as needed.  Today, he is here for evaluation of CT calcium score ordered by PCP which revealed scattered atherosclerotic calcifications of the thoracic aorta, three-vessel coronary atherosclerotic calcifications with calcium score of 400 (74th percentile for age/sex matched controls). He called to request Dr. Kyla Balzarine recommendations and was advised to start rosuvastatin 20 mg daily. Soon after starting rosuvastatin, he had 2 migraines within close proximity to one another which is unusual for him, so he stopped it. He denies chest pain, shortness of breath, lower extremity edema, fatigue, palpitations, melena, hematuria, hemoptysis, diaphoresis, weakness, presyncope, syncope, orthopnea, and PND. Is working to get appropriate PAP equipment for OSA, mask causes nasal congestion.   Past Medical  History:  Diagnosis Date   Allergic rhinitis, cause unspecified    to tick bite - resolved   Arthritis    knees, hands   Balanoposthitis    Mild   Chest pain, unspecified    2007- stress test normal   Constipation 09/24/2009   Prn metamucil  - occasional   Dry eye syndrome    GERD (gastroesophageal reflux disease)    Hearing loss    slight - bilateral - no hearing aids   HEMORRHOID, EXTERNAL, THROMBOSED 05/06/2010   Qualifier: Diagnosis of  By: Ronnald Ramp MD, Arvid Right.    History of shingles    Hypertension    Migraine, unspecified, without mention of intractable migraine without mention of status migrainosus    Severe sleep apnea 07/07/2022   TINNITUS NOS 01/31/2008   Qualifier: Diagnosis of  By: Alain Marion MD, Evie Lacks     Past Surgical History:  Procedure Laterality Date   COLONOSCOPY  05/16/2011    hx polyps    FOOT FRACTURE SURGERY  05/16/1991   right-crushed all 4 toes reportedly   KNEE ARTHROSCOPY  05/15/2009   Right for Condromalacia   ORIF FEMUR FRACTURE  05/16/1991   Right   POLYPECTOMY     WISDOM TOOTH EXTRACTION      Current Medications: Current Meds  Medication Sig   amLODipine (NORVASC) 10 MG tablet TAKE 1 TABLET BY MOUTH EVERY DAY   atorvastatin (LIPITOR) 40 MG tablet Take 1 tablet (40 mg total) by mouth every evening.   betamethasone dipropionate 0.05 % cream PRN   diclofenac (VOLTAREN) 75 MG EC tablet DAILY   diphenhydrAMINE (BENADRYL)  25 MG tablet Take 25 mg by mouth every 6 (six) hours as needed for itching.   hydrocortisone 2.5 % cream PLEASE SEE ATTACHED FOR DETAILED DIRECTIONS   metoprolol succinate (TOPROL-XL) 50 MG 24 hr tablet TAKE 2 TABLETS BY MOUTH EVERY DAY   metroNIDAZOLE (METROGEL) 0.75 % gel Apply topically daily. PRN   Multiple Vitamin (MULTI VITAMIN PO) Take by mouth.   pramoxine-hydrocortisone (ANALPRAM HC) cream Apply topically in the morning and at bedtime. (Patient taking differently: Apply topically in the morning and at bedtime.  PRN)   SUMAtriptan (IMITREX) 50 MG tablet TAKE 1 TABLET BY MOUTH AS NEEDED MIGRAINE   tadalafil (CIALIS) 10 MG tablet Take 1 tablet (10 mg total) by mouth every 3 (three) days as needed for erectile dysfunction.   triamcinolone cream (KENALOG) 0.5 % Apply 1 application topically 2 (two) times daily. For 10 days to affected area (Patient taking differently: Apply 1 application  topically 2 (two) times daily. 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 1 dtr - '87, 1 son - '83, 2 granddaughters   Marriage in good health.       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       Hobbies: time with grandkids, work with cows, minimal travel due to pain   Social Determinants of Health   Financial Resource Strain: Not on file  Food Insecurity: Not on file  Transportation Needs: Not on file  Physical Activity: Not on file  Stress: Not on file  Social Connections: Not on file     Family History: The patient's family  history includes Colon polyps in his sister; Diabetes in his father; Diabetes type II in his mother; Heart disease in his father and mother; Hyperlipidemia in his mother; Hypertension in his father and mother; Schizophrenia in his father. There is no history of Colon cancer, Stomach cancer, Esophageal cancer, Rectal cancer, or Crohn's disease.  ROS:   Please see the history of present illness.  All other systems reviewed and are negative.  Labs/Other Studies Reviewed:    The following studies were reviewed today:  CT Cardiac Score 07/26/22 IMPRESSION: 1. Three-vessel coronary atherosclerotic calcifications. The observed calcium score of 400 is at the seventy-fourth percentile for subjects of the same  age, gender, and race/ethnicity who are free of clinical cardiovascular disease and treated diabetes. 2. Aortic valvular calcifications. Consider correlation with echocardiography as clinically indicated. 3.  Aortic Atherosclerosis (ICD10-I70.0).  ETT 03/24/21  No ST deviation was noted.   ETT with normal exercise tolerance (10:00); no chest pain; normal blood pressure response; no ST changes; negative adequate exercise treadmill; Duke treadmill score 10.  Cardiac monitor 11/29/20 Patient had a min HR of 48 bpm, max HR of 126 bpm, and avg HR of 75 bpm. Predominant underlying rhythm was Sinus Rhythm. Isolated SVEs were rare (<1.0%), SVE Couplets were rare (<1.0%), and no SVE Triplets were present. Isolated VEs were rare (<1.0%), VE  Couplets were rare (<1.0%), and no VE Triplets were present. Ventricular Trigeminy was present.    Summary:  NSR average HR 75 bpm  PAC <1% and PVC < 1% total beats No significant arrhythmias  Recent Labs: 09/15/2021: TSH 1.63 03/17/2022: ALT 40; BUN 12; Creatinine, Ser 0.92; Hemoglobin 16.7; Platelets 152.0; Potassium 4.2; Sodium 141  Recent Lipid Panel    Component Value Date/Time   CHOL 194 09/15/2021 1052   TRIG 260.0 (H) 09/15/2021 1052   HDL 43.50  09/15/2021 1052   CHOLHDL 4 09/15/2021 1052   VLDL 52.0 (H) 09/15/2021 1052   LDLCALC 81 07/28/2014 0836   LDLDIRECT 126.0 09/15/2021 1052     Risk Assessment/Calculations:       Physical Exam:    VS:  BP 130/78   Pulse 68   Ht 5\' 6"  (1.676 m)   Wt 169 lb 6.4 oz (76.8 kg)   SpO2 94%   BMI 27.34 kg/m     Wt Readings from Last 3 Encounters:  08/14/22 169 lb 6.4 oz (76.8 kg)  07/07/22 175 lb 3.2 oz (79.5 kg)  04/05/22 173 lb (78.5 kg)     GEN:  Well nourished, well developed in no acute distress HEENT: Normal NECK: No JVD; No carotid bruits CARDIAC: RRR, no murmurs, rubs, gallops RESPIRATORY:  Clear to auscultation without rales, wheezing or rhonchi  ABDOMEN: Soft, non-tender, non-distended MUSCULOSKELETAL:  No edema; No deformity. 2+ pedal pulses, equal bilaterally SKIN: Warm and dry NEUROLOGIC:  Alert and oriented x 3 PSYCHIATRIC:  Normal affect   EKG:  EKG is ordered today.  The ekg ordered today demonstrates normal sinus rhythm at 68 bpm, no ST abnormality  Diagnoses:    1. Hyperlipidemia LDL goal <70   2. Primary hypertension   3. Palpitations   4. Aortic atherosclerosis   5. Agatston CAC score, >400   6. Severe sleep apnea   7. Cardiac risk counseling    Assessment and Plan:     Coronary artery disease/aortic atherosclerosis: CT calcium score 400 on 07/26/22 with scattered atherosclerotic calcifications of the thoracic aorta, as well as three-vessel coronary atherosclerosis. He denies chest pain, dyspnea, or other symptoms concerning for angina. No indication for further ischemic evaluation at this time. Lengthy discussion about heart healthy lifestyle and LDL goal < 55. Did not tolerate rosuvastatin 20 mg daily. Will try atorvastatin. Emphasized lifestyle changes to incorporate mostly plant based diet and to avoid saturated fat, processed foods, and sugar.  Hyperlipidemia LDL goal < 70:  LDL 126 on 09/15/21. Discussed potential side effect of migraines with  Jordan Cantrell, RPH who suggests that rosuvastatin not likely the culprit, however we will try atorvastatin 40 mg daily. Asked patient to notify me if he has concerning side effects on atorvastatin. Will recheck fasting lipid/ALT in 2-3 months.  Hypertension: BP initially elevated but improved on my recheck. Has been well controlled at other appointments recently. Continue amlodipine, metoprolol.  Cardiac risk counseling: Lengthy discussion of cardiac risk with elevated CAC. He is currently asymptomatic. Emphasized heart healthy lifestyle changes to include mostly plant based diet and 150 minutes of moderate intensity exercise each week along with medication to reduce LDL cholesterol.   OSA: Sleep study 06/2022 revealed severe OSA with AHI 36.1/hr. CPAP is causing nasal congestion, going to try a different mask. Management per pulmonology.     Disposition: 6 months with Dr. Johnsie Cancel or me  Medication Adjustments/Labs and Tests Ordered: Current medicines are reviewed at length with the patient today.  Concerns regarding medicines are outlined above.  Orders Placed This Encounter  Procedures   Lipid Profile   ALT   EKG 12-Lead   Meds ordered this encounter  Medications   atorvastatin (LIPITOR) 40 MG tablet    Sig: Take 1 tablet (40 mg total) by mouth every evening.    Dispense:  30 tablet    Refill:  11    Patient Instructions  Medication Instructions:   DISCONTINUE Rosuvastatin.  START Atorvastatin one (1) tablet by mouth ( 40 mg) every evening.   *If you need a refill on your cardiac medications before your next appointment, please call your pharmacy*   Lab Work:  Your physician recommends that you return for a FASTING lipid profile/ALT on Tuesday, June 11. You can come in on the day of your appointment anytime between 7:30-4:30 fasting from midnight the night before.    If you have labs (blood work) drawn today and your tests are completely normal, you will receive your results  only by: [place] (if you have MyChart) OR A paper copy in the mail If you have any lab test that is abnormal or we need to change your treatment, we will call you to review the results.   Testing/Procedures:  None ordered.   Follow-Up: At [facility], you and your health needs are our priority.  As part of our continuing mission to provide you with exceptional heart care, we have created designated Provider Care Teams.  These Care Teams include your primary Cardiologist (physician) and Advanced Practice Providers (APPs -  Physician Assistants and Nurse Practitioners) who all work together to provide you with the care you need, when you need it.  We recommend signing up for the patient portal called "MyChart".  Sign up information is provided on this After Visit Summary.  MyChart is used to connect with patients for Virtual Visits (Telemedicine).  Patients are able to view lab/test results, encounter notes, upcoming appointments, etc.  Non-urgent messages can be sent to your provider as well.   To learn more about what you can do with MyChart, go to [email or web address].    Your next appointment:   6 month(s)  Provider:   Jenkins Rouge, MD or Christen Bame, NP     Other Instructions  Your physician wants you to follow-up in: 6 months.  You will receive a reminder letter in the mail two months in advance. If you don't receive a letter, please call our office to schedule the follow-up appointment.  Mediterranean Diet A Mediterranean diet refers to food and lifestyle choices that are based on the traditions of countries located on the [employer]. It focuses on eating more fruits, vegetables, whole grains, beans, nuts, seeds, and heart-healthy fats, and eating less dairy, meat, eggs, and processed foods with added sugar, salt,  and fat. This way of eating has been shown to help prevent certain conditions and improve outcomes for people who have chronic diseases,  like kidney disease and heart disease. What are tips for following this plan? Reading food labels Check the serving size of packaged foods. For foods such as rice and pasta, the serving size refers to the amount of cooked product, not dry. Check the total fat in packaged foods. Avoid foods that have saturated fat or trans fats. Check the ingredient list for added sugars, such as corn syrup. Shopping  Buy a variety of foods that offer a balanced diet, including: Fresh fruits and vegetables (produce). Grains, beans, nuts, and seeds. Some of these may be available in unpackaged forms or large amounts (in bulk). Fresh seafood. Poultry and eggs. Low-fat dairy products. Buy whole ingredients instead of prepackaged foods. Buy fresh fruits and vegetables in-season from local farmers markets. Buy plain frozen fruits and vegetables. If you do not have access to quality fresh seafood, buy precooked frozen shrimp or canned fish, such as tuna, salmon, or sardines. Stock your pantry so you always have certain foods on hand, such as olive oil, canned tuna, canned tomatoes, rice, pasta, and beans. Cooking Cook foods with extra-virgin olive oil instead of using butter or other vegetable oils. Have meat as a side dish, and have vegetables or grains as your main dish. This means having meat in small portions or adding small amounts of meat to foods like pasta or stew. Use beans or vegetables instead of meat in common dishes like chili or lasagna. Experiment with different cooking methods. Try roasting, broiling, steaming, and sauting vegetables. Add frozen vegetables to soups, stews, pasta, or rice. Add nuts or seeds for added healthy fats and plant protein at each meal. You can add these to yogurt, salads, or vegetable dishes. Marinate fish or vegetables using olive oil, lemon juice, garlic, and fresh herbs. Meal planning Plan to eat one vegetarian meal one day each week. Try to work up to two vegetarian  meals, if possible. Eat seafood two or more times a week. Have healthy snacks readily available, such as: Vegetable sticks with hummus. Greek yogurt. Fruit and nut trail mix. Eat balanced meals throughout the week. This includes: Fruit: 2-3 servings a day. Vegetables: 4-5 servings a day. Low-fat dairy: 2 servings a day. Fish, poultry, or lean meat: 1 serving a day. Beans and legumes: 2 or more servings a week. Nuts and seeds: 1-2 servings a day. Whole grains: 6-8 servings a day. Extra-virgin olive oil: 3-4 servings a day. Limit red meat and sweets to only a few servings a month. Lifestyle  Cook and eat meals together with your family, when possible. Drink enough fluid to keep your urine pale yellow. Be physically active every day. This includes: Aerobic exercise like running or swimming. Leisure activities like gardening, walking, or housework. Get 7-8 hours of sleep each night. If recommended by your health care provider, drink red wine in moderation. This means 1 glass a day for nonpregnant women and 2 glasses a day for men. A glass of wine equals 5 oz (150 mL). What foods should I eat? Fruits Apples. Apricots. Avocado. Berries. Bananas. Cherries. Dates. Figs. Grapes. Lemons. Melon. Oranges. Peaches. Plums. Pomegranate. Vegetables Artichokes. Beets. Broccoli. Cabbage. Carrots. Eggplant. Green beans. Chard. Kale. Spinach. Onions. Leeks. Peas. Squash. Tomatoes. Peppers. Radishes. Grains Whole-grain pasta. Brown rice. Bulgur wheat. Polenta. Couscous. Whole-wheat bread. [place]. Meats and other proteins Beans. Almonds. Sunflower seeds. Pine nuts. Peanuts.  [place]. [place]. Scallops. Shrimp. [place]. Tilapia. Clams. Oysters. Eggs. Poultry without skin. Dairy Low-fat milk. Cheese. Greek yogurt. Fats and oils Extra-virgin olive oil. Avocado oil. Grapeseed oil. Beverages Water. Red wine. Herbal tea. Sweets and desserts Greek yogurt with honey. Baked apples. Poached pears. Trail  mix. Seasonings and condiments Basil. Cilantro. Coriander. Cumin. Mint. Parsley. Sage. Rosemary. Tarragon. Garlic. Oregano. Thyme. Pepper. Balsamic vinegar. Tahini. Hummus. Tomato sauce. Olives. Mushrooms. The items listed above may not be a complete list of foods and beverages you can eat. Contact a dietitian for more information. What foods should I limit? This is a list of foods that should be eaten rarely or only on special occasions. Fruits Fruit canned in syrup. Vegetables Deep-fried potatoes (french fries). Grains Prepackaged pasta or rice dishes. Prepackaged cereal with added sugar. Prepackaged snacks with added sugar. Meats and other proteins Beef. Pork. Lamb. Poultry with skin. Hot dogs. Berniece Salines. Dairy Ice cream. Sour cream. Whole milk. Fats and oils Butter. Canola oil. Vegetable oil. Beef fat (tallow). Lard. Beverages Juice. Sugar-sweetened soft drinks. Beer. Liquor and spirits. Sweets and desserts Cookies. Cakes. Pies. Candy. Seasonings and condiments Mayonnaise. Pre-made sauces and marinades. The items listed above may not be a complete list of foods and beverages you should limit. Contact a dietitian for more information. Summary The Mediterranean diet includes both food and lifestyle choices. Eat a variety of fresh fruits and vegetables, beans, nuts, seeds, and whole grains. Limit the amount of red meat and sweets that you eat. If recommended by your health care provider, drink red wine in moderation. This means 1 glass a day for nonpregnant women and 2 glasses a day for men. A glass of wine equals 5 oz (150 mL). This information is not intended to replace advice given to you by your health care provider. Make sure you discuss any questions you have with your health care provider. Document Revised: 06/06/2019 Document Reviewed: 04/03/2019 Elsevier Patient Education  [place]. Adopting a Healthy Lifestyle.   Weight: Know what a healthy weight is for you  (roughly BMI <25) and aim to maintain this. You can calculate your body mass index on your smart phone  Diet: Aim for 7+ servings of fruits and vegetables daily Limit animal fats in diet for cholesterol and heart health - choose grass fed whenever available Avoid highly processed foods (fast food burgers, tacos, fried chicken, pizza, hot dogs, french fries)  Saturated fat comes in the form of butter, lard, coconut oil, margarine, partially hydrogenated oils, and fat in meat. These increase your risk of cardiovascular disease.  Use healthy plant oils, such as olive, canola, soy, corn, sunflower and peanut.  Whole foods such as fruits, vegetables and whole grains have fiber  Men need > 38 grams of fiber per day Women need > 25 grams of fiber per day  Load up on vegetables and fruits - one-half of your plate: Aim for color and variety, and remember that potatoes dont count. Go for whole grains - one-quarter of your plate: Whole wheat, barley, wheat berries, quinoa, oats, brown rice, and foods made with them. If you want pasta, go with whole wheat pasta. Protein power - one-quarter of your plate: Fish, chicken, beans, and nuts are all healthy, versatile protein sources. Limit red meat. You need carbohydrates for energy! The type of carbohydrate is more important than the amount. Choose carbohydrates such as vegetables, fruits, whole grains, beans, and nuts in the place of white rice, white pasta, potatoes (baked or fried), macaroni and  cheese, cakes, cookies, and donuts.  If youre thirsty, drink water. Coffee and tea are good in moderation, but skip sugary drinks and limit milk and dairy products to one or two daily servings. Keep sugar intake at 6 teaspoons or 24 grams or LESS       Exercise: Aim for 150 min of moderate intensity exercise weekly for heart health, and weights twice weekly for bone health Stay active - any steps are better than no steps! Aim for 7-9 hours of sleep daily            Signed, Jordan Life, NP  08/14/2022 12:32 PM    [place]

## 2022-08-14 ENCOUNTER — Encounter: Payer: Self-pay | Admitting: Nurse Practitioner

## 2022-08-14 ENCOUNTER — Ambulatory Visit: Payer: 59 | Attending: Nurse Practitioner | Admitting: Nurse Practitioner

## 2022-08-14 VITALS — BP 130/78 | HR 68 | Ht 66.0 in | Wt 169.4 lb

## 2022-08-14 DIAGNOSIS — I10 Essential (primary) hypertension: Secondary | ICD-10-CM

## 2022-08-14 DIAGNOSIS — G4730 Sleep apnea, unspecified: Secondary | ICD-10-CM

## 2022-08-14 DIAGNOSIS — R931 Abnormal findings on diagnostic imaging of heart and coronary circulation: Secondary | ICD-10-CM

## 2022-08-14 DIAGNOSIS — R002 Palpitations: Secondary | ICD-10-CM

## 2022-08-14 DIAGNOSIS — I700 Atherosclerosis of aorta: Secondary | ICD-10-CM

## 2022-08-14 DIAGNOSIS — E785 Hyperlipidemia, unspecified: Secondary | ICD-10-CM

## 2022-08-14 DIAGNOSIS — Z7189 Other specified counseling: Secondary | ICD-10-CM

## 2022-08-14 MED ORDER — ATORVASTATIN CALCIUM 40 MG PO TABS: 40.0000 mg | ORAL_TABLET | Freq: Every evening | ORAL | 11 refills | Status: DC

## 2022-08-14 NOTE — Patient Instructions (Signed)
Medication Instructions:   DISCONTINUE Rosuvastatin.  START Atorvastatin one (1) tablet by mouth ( 40 mg) every evening.   *If you need a refill on your cardiac medications before your next appointment, please call your pharmacy*   Lab Work:  Your physician recommends that you return for a FASTING lipid profile/ALT on Tuesday, June 11. You can come in on the day of your appointment anytime between 7:30-4:30 fasting from midnight the night before.    If you have labs (blood work) drawn today and your tests are completely normal, you will receive your results only by: [place] (if you have MyChart) OR A paper copy in the mail If you have any lab test that is abnormal or we need to change your treatment, we will call you to review the results.   Testing/Procedures:  None ordered.   Follow-Up: At [facility], you and your health needs are our priority.  As part of our continuing mission to provide you with exceptional heart care, we have created designated Provider Care Teams.  These Care Teams include your primary Cardiologist (physician) and Advanced Practice Providers (APPs -  Physician Assistants and Nurse Practitioners) who all work together to provide you with the care you need, when you need it.  We recommend signing up for the patient portal called "MyChart".  Sign up information is provided on this After Visit Summary.  MyChart is used to connect with patients for Virtual Visits (Telemedicine).  Patients are able to view lab/test results, encounter notes, upcoming appointments, etc.  Non-urgent messages can be sent to your provider as well.   To learn more about what you can do with MyChart, go to [email or web address].    Your next appointment:   6 month(s)  Provider:   Jenkins Rouge, MD or Christen Bame, NP     Other Instructions  Your physician wants you to follow-up in: 6 months.  You will receive a reminder letter in the mail two months in  advance. If you don't receive a letter, please call our office to schedule the follow-up appointment.  Mediterranean Diet A Mediterranean diet refers to food and lifestyle choices that are based on the traditions of countries located on the [employer]. It focuses on eating more fruits, vegetables, whole grains, beans, nuts, seeds, and heart-healthy fats, and eating less dairy, meat, eggs, and processed foods with added sugar, salt, and fat. This way of eating has been shown to help prevent certain conditions and improve outcomes for people who have chronic diseases, like kidney disease and heart disease. What are tips for following this plan? Reading food labels Check the serving size of packaged foods. For foods such as rice and pasta, the serving size refers to the amount of cooked product, not dry. Check the total fat in packaged foods. Avoid foods that have saturated fat or trans fats. Check the ingredient list for added sugars, such as corn syrup. Shopping  Buy a variety of foods that offer a balanced diet, including: Fresh fruits and vegetables (produce). Grains, beans, nuts, and seeds. Some of these may be available in unpackaged forms or large amounts (in bulk). Fresh seafood. Poultry and eggs. Low-fat dairy products. Buy whole ingredients instead of prepackaged foods. Buy fresh fruits and vegetables in-season from local farmers markets. Buy plain frozen fruits and vegetables. If you do not have access to quality fresh seafood, buy precooked frozen shrimp or canned fish, such as tuna, salmon, or sardines. Stock your pantry so you always have  certain foods on hand, such as olive oil, canned tuna, canned tomatoes, rice, pasta, and beans. Cooking Cook foods with extra-virgin olive oil instead of using butter or other vegetable oils. Have meat as a side dish, and have vegetables or grains as your main dish. This means having meat in small portions or adding small amounts of meat to  foods like pasta or stew. Use beans or vegetables instead of meat in common dishes like chili or lasagna. Experiment with different cooking methods. Try roasting, broiling, steaming, and sauting vegetables. Add frozen vegetables to soups, stews, pasta, or rice. Add nuts or seeds for added healthy fats and plant protein at each meal. You can add these to yogurt, salads, or vegetable dishes. Marinate fish or vegetables using olive oil, lemon juice, garlic, and fresh herbs. Meal planning Plan to eat one vegetarian meal one day each week. Try to work up to two vegetarian meals, if possible. Eat seafood two or more times a week. Have healthy snacks readily available, such as: Vegetable sticks with hummus. Greek yogurt. Fruit and nut trail mix. Eat balanced meals throughout the week. This includes: Fruit: 2-3 servings a day. Vegetables: 4-5 servings a day. Low-fat dairy: 2 servings a day. Fish, poultry, or lean meat: 1 serving a day. Beans and legumes: 2 or more servings a week. Nuts and seeds: 1-2 servings a day. Whole grains: 6-8 servings a day. Extra-virgin olive oil: 3-4 servings a day. Limit red meat and sweets to only a few servings a month. Lifestyle  Cook and eat meals together with your family, when possible. Drink enough fluid to keep your urine pale yellow. Be physically active every day. This includes: Aerobic exercise like running or swimming. Leisure activities like gardening, walking, or housework. Get 7-8 hours of sleep each night. If recommended by your health care provider, drink red wine in moderation. This means 1 glass a day for nonpregnant women and 2 glasses a day for men. A glass of wine equals 5 oz (150 mL). What foods should I eat? Fruits Apples. Apricots. Avocado. Berries. Bananas. Cherries. Dates. Figs. Grapes. Lemons. Melon. Oranges. Peaches. Plums. Pomegranate. Vegetables Artichokes. Beets. Broccoli. Cabbage. Carrots. Eggplant. Green beans. Chard. Kale.  Spinach. Onions. Leeks. Peas. Squash. Tomatoes. Peppers. Radishes. Grains Whole-grain pasta. Brown rice. Bulgur wheat. Polenta. Couscous. Whole-wheat bread. [place]. Meats and other proteins Beans. Almonds. Sunflower seeds. Pine nuts. Peanuts. [place]. Salmon. Scallops. Shrimp. [place]. Tilapia. Clams. Oysters. Eggs. Poultry without skin. Dairy Low-fat milk. Cheese. Greek yogurt. Fats and oils Extra-virgin olive oil. Avocado oil. Grapeseed oil. Beverages Water. Red wine. Herbal tea. Sweets and desserts Greek yogurt with honey. Baked apples. Poached pears. Trail mix. Seasonings and condiments Basil. Cilantro. Coriander. Cumin. Mint. Parsley. Sage. Rosemary. Tarragon. Garlic. Oregano. Thyme. Pepper. Balsamic vinegar. Tahini. Hummus. Tomato sauce. Olives. Mushrooms. The items listed above may not be a complete list of foods and beverages you can eat. Contact a dietitian for more information. What foods should I limit? This is a list of foods that should be eaten rarely or only on special occasions. Fruits Fruit canned in syrup. Vegetables Deep-fried potatoes (french fries). Grains Prepackaged pasta or rice dishes. Prepackaged cereal with added sugar. Prepackaged snacks with added sugar. Meats and other proteins Beef. Pork. Lamb. Poultry with skin. Hot dogs. Berniece Salines. Dairy Ice cream. Sour cream. Whole milk. Fats and oils Butter. Canola oil. Vegetable oil. Beef fat (tallow). Lard. Beverages Juice. Sugar-sweetened soft drinks. Beer. Liquor and spirits. Sweets and desserts Cookies. Cakes. Pies. Candy. Seasonings  and condiments Mayonnaise. Pre-made sauces and marinades. The items listed above may not be a complete list of foods and beverages you should limit. Contact a dietitian for more information. Summary The Mediterranean diet includes both food and lifestyle choices. Eat a variety of fresh fruits and vegetables, beans, nuts, seeds, and whole grains. Limit the amount of red meat  and sweets that you eat. If recommended by your health care provider, drink red wine in moderation. This means 1 glass a day for nonpregnant women and 2 glasses a day for men. A glass of wine equals 5 oz (150 mL). This information is not intended to replace advice given to you by your health care provider. Make sure you discuss any questions you have with your health care provider. Document Revised: 06/06/2019 Document Reviewed: 04/03/2019 Elsevier Patient Education  [place]. Adopting a Healthy Lifestyle.   Weight: Know what a healthy weight is for you (roughly BMI <25) and aim to maintain this. You can calculate your body mass index on your smart phone  Diet: Aim for 7+ servings of fruits and vegetables daily Limit animal fats in diet for cholesterol and heart health - choose grass fed whenever available Avoid highly processed foods (fast food burgers, tacos, fried chicken, pizza, hot dogs, french fries)  Saturated fat comes in the form of butter, lard, coconut oil, margarine, partially hydrogenated oils, and fat in meat. These increase your risk of cardiovascular disease.  Use healthy plant oils, such as olive, canola, soy, corn, sunflower and peanut.  Whole foods such as fruits, vegetables and whole grains have fiber  Men need > 38 grams of fiber per day Women need > 25 grams of fiber per day  Load up on vegetables and fruits - one-half of your plate: Aim for color and variety, and remember that potatoes dont count. Go for whole grains - one-quarter of your plate: Whole wheat, barley, wheat berries, quinoa, oats, brown rice, and foods made with them. If you want pasta, go with whole wheat pasta. Protein power - one-quarter of your plate: Fish, chicken, beans, and nuts are all healthy, versatile protein sources. Limit red meat. You need carbohydrates for energy! The type of carbohydrate is more important than the amount. Choose carbohydrates such as vegetables, fruits, whole grains,  beans, and nuts in the place of white rice, white pasta, potatoes (baked or fried), macaroni and cheese, cakes, cookies, and donuts.  If youre thirsty, drink water. Coffee and tea are good in moderation, but skip sugary drinks and limit milk and dairy products to one or two daily servings. Keep sugar intake at 6 teaspoons or 24 grams or LESS       Exercise: Aim for 150 min of moderate intensity exercise weekly for heart health, and weights twice weekly for bone health Stay active - any steps are better than no steps! Aim for 7-9 hours of sleep daily

## 2022-08-25 ENCOUNTER — Encounter: Payer: Self-pay | Admitting: Family Medicine

## 2022-08-25 DIAGNOSIS — G43109 Migraine with aura, not intractable, without status migrainosus: Secondary | ICD-10-CM

## 2022-09-01 ENCOUNTER — Encounter: Payer: Self-pay | Admitting: Neurology

## 2022-09-06 ENCOUNTER — Ambulatory Visit: Payer: 59 | Admitting: Primary Care

## 2022-09-06 ENCOUNTER — Encounter: Payer: Self-pay | Admitting: Primary Care

## 2022-09-06 VITALS — BP 116/64 | HR 70 | Temp 98.1°F | Ht 66.0 in | Wt 171.0 lb

## 2022-09-06 DIAGNOSIS — G4733 Obstructive sleep apnea (adult) (pediatric): Secondary | ICD-10-CM | POA: Diagnosis not present

## 2022-09-06 DIAGNOSIS — G4730 Sleep apnea, unspecified: Secondary | ICD-10-CM

## 2022-09-06 NOTE — Progress Notes (Signed)
@Patient  ID: Jordan Cantrell, male    DOB: 1961-03-14, 62 y.o.   MRN: [id number]  Chief Complaint  Patient presents with   Follow-up    Review CPAP compliance.  C/o CPAP leaking water in mask    Referring provider: Shelva Majestic, MD  HPI: 62 year old male, former light smoker quit in 2015 (uses snuff).  Past medical history significant for hypertension, allergic rhinitis, osteoarthritis, hyperlipidemia, insomnia, urinary frequency, tobacco abuse.  Previous [facility] pulmonary encounter:  04/05/2022  Patient presents today for sleep consult. Patient has symptoms of insomnia and disrupted sleep.  His wife has told him that he snores.  He has a history of sleep apnea but never been on CPAP.  Sleep study was greater than 96 years old, results are not in chart for review.  He works as a Aeronautical engineer. He has worked third shift his whole career, typical hours were form 11pm-8am. He has taken sleep aids in the past. Tried melatonin and temazepam.  He takes over the counter medication called deep sleep 1 hour prior to bedtime which seems to help maintain sleep better.  Typical bedtime is between 9 and 10 PM.  It takes him on average 10 to 30 minutes to fall asleep.  He wakes up 3-5 times a night.  He will start his day between 430 and 5:30 AM.  He gets woken up in the morning by leg camps and also describes burning sensation in his feet at night.  He falls asleep easily when inactive more so in the afternoon.  Epworth score 13.  Denies symptoms of narcolepsy, cataplexy or sleepwalking.  Sleep questionnaire Symptoms-  Waking up at night  Prior sleep study- > 10 years  Bedtime- 9-10pm  Time to fall asleep- 10-57mins  Nocturnal awakenings- 3-5 times  Out of bed/start of day- 4:30-5:30am  Weight changes- stable  Do you operate heavy machinery- no  Do you currently wear CPAP- no Do you current wear oxygen- no Epworth- 13   07/07/2022 Patient presents today to review home sleep study.   Patient had HST on 06/26/2022 that showed evidence of severe obstructive sleep apnea, AHI 36.1 an hour with SpO2 low 85% (average 93%).  We discussed risks of untreated sleep apnea and treatment options.  Patient is hesitant to start CPAP but willing to try.  He is unsure he is can be able to tolerate having something on his face at night while sleeping. He also reports having symptoms of tinnitus, we discussed seeing an ear nose and throat doctor to evaluate.    09/06/2022- Interim hx  Patient presents today for OSA follow-up. HST on 06/26/2022 showed evidence of severe obstructive sleep apnea, AHI 36.1 an hour with SpO2 low 85% (average 93%). He has not seen much difference in his sleep quality. He changed to nasal pillow mask which has helped a lot. Recently started getting water condensation in his mask. He goes to bed around 9:30pm. He wakes up the same amount of times. Blood pressure has improved and he has lost 8-10 lbs. He is having a lot more migraines. He is keeping a food diary and has an apt with neurology.   Airview download 08/06/22-09/04/22 Usage days 28/30 days (93%); 21 days (70%) > 4 hours Average usage 4 hours 16 mins Pressure 5-15cm h20 (10.4cm h20- 95%) Airleaks 17L/min (95%) AHI 9.5   Allergies  Allergen Reactions   Codeine Nausea Only    Immunization History  Administered Date(s) Administered   Influenza,inj,Quad PF,6+ Mos  02/17/2016, 03/21/2017, 03/01/2018, 03/19/2019, 03/04/2020   Influenza,inj,quad, With Preservative 02/28/2018   Influenza-Unspecified 03/29/2014, 02/28/2018   Tdap 11/12/2013    Past Medical History:  Diagnosis Date   Allergic rhinitis, cause unspecified    to tick bite - resolved   Arthritis    knees, hands   Balanoposthitis    Mild   Chest pain, unspecified    2007- stress test normal   Constipation 09/24/2009   Prn metamucil  - occasional   Dry eye syndrome    GERD (gastroesophageal reflux disease)    Hearing loss    slight - bilateral  - no hearing aids   HEMORRHOID, EXTERNAL, THROMBOSED 05/06/2010   Qualifier: Diagnosis of  By: Yetta Barre MD, Bernadene Bell.    History of shingles    Hypertension    Migraine, unspecified, without mention of intractable migraine without mention of status migrainosus    Severe sleep apnea 07/07/2022   TINNITUS NOS 01/31/2008   Qualifier: Diagnosis of  By: Plotnikov MD, Georgina Quint     Tobacco History: Social History   Tobacco Use  Smoking Status Former   Packs/day: .1   Types: Cigarettes   Quit date: 04/28/2014   Years since quitting: 8.3   Passive exposure: Past ("PARENTS SMOKED")  Smokeless Tobacco Current   Types: Snuff   Ready to quit: Not Answered Counseling given: Not Answered   Outpatient Medications Prior to Visit  Medication Sig Dispense Refill   amLODipine (NORVASC) 10 MG tablet TAKE 1 TABLET BY MOUTH EVERY DAY 90 tablet 3   atorvastatin (LIPITOR) 40 MG tablet Take 1 tablet (40 mg total) by mouth every evening. 30 tablet 11   betamethasone dipropionate 0.05 % cream PRN     diclofenac (VOLTAREN) 75 MG EC tablet DAILY     diphenhydrAMINE (BENADRYL) 25 MG tablet Take 25 mg by mouth every 6 (six) hours as needed for itching.     hydrocortisone 2.5 % cream PLEASE SEE ATTACHED FOR DETAILED DIRECTIONS     metoprolol succinate (TOPROL-XL) 50 MG 24 hr tablet TAKE 2 TABLETS BY MOUTH EVERY DAY 180 tablet 3   metroNIDAZOLE (METROGEL) 0.75 % gel Apply topically daily. PRN     Multiple Vitamin (MULTI VITAMIN PO) Take by mouth.     pramoxine-hydrocortisone (ANALPRAM HC) cream Apply topically in the morning and at bedtime. (Patient taking differently: Apply topically in the morning and at bedtime. PRN) 57 g 3   SUMAtriptan (IMITREX) 50 MG tablet TAKE 1 TABLET BY MOUTH AS NEEDED MIGRAINE 6 tablet 10   tadalafil (CIALIS) 10 MG tablet Take 1 tablet (10 mg total) by mouth every 3 (three) days as needed for erectile dysfunction. 10 tablet 3   triamcinolone cream (KENALOG) 0.5 % Apply 1 application  topically 2 (two) times daily. For 10 days to affected area (Patient taking differently: Apply 1 application  topically 2 (two) times daily. PRN) 30 g 0   No facility-administered medications prior to visit.   Review of Systems  Review of Systems  Constitutional: Negative.   Respiratory: Negative.    Cardiovascular: Negative.   Neurological:  Positive for headaches.   Physical Exam  BP 116/64 (BP Location: Left Arm, Patient Position: Sitting, Cuff Size: Normal)   Pulse 70   Temp 98.1 F (36.7 C) (Oral)   Ht 5\' 6"  (1.676 m)   Wt 171 lb (77.6 kg)   SpO2 98%   BMI 27.60 kg/m  Physical Exam Constitutional:      General: He is not in  acute distress.    Appearance: Normal appearance. He is not ill-appearing.  HENT:     Head: Normocephalic and atraumatic.     Mouth/Throat:     Pharynx: Oropharynx is clear.  Cardiovascular:     Rate and Rhythm: Normal rate and regular rhythm.  Pulmonary:     Effort: Pulmonary effort is normal.     Breath sounds: Normal breath sounds.  Musculoskeletal:        General: Normal range of motion.  Skin:    General: Skin is warm and dry.  Neurological:     General: No focal deficit present.     Mental Status: He is alert and oriented to person, place, and time. Mental status is at baseline.  Psychiatric:        Mood and Affect: Mood normal.        Behavior: Behavior normal.        Thought Content: Thought content normal.        Judgment: Judgment normal.      Lab Results:  CBC    Component Value Date/Time   WBC 6.7 03/17/2022 0939   RBC 5.13 03/17/2022 0939   HGB 16.7 03/17/2022 0939   HCT 48.5 03/17/2022 0939   PLT 152.0 03/17/2022 0939   MCV 94.5 03/17/2022 0939   MCHC 34.4 03/17/2022 0939   RDW 12.6 03/17/2022 0939   LYMPHSABS 1.4 03/17/2022 0939   MONOABS 0.8 03/17/2022 0939   EOSABS 0.1 03/17/2022 0939   BASOSABS 0.0 03/17/2022 0939    BMET    Component Value Date/Time   NA 141 03/17/2022 0939   K 4.2 03/17/2022 0939    CL 103 03/17/2022 0939   CO2 29 03/17/2022 0939   GLUCOSE 103 (H) 03/17/2022 0939   BUN 12 03/17/2022 0939   CREATININE 0.92 03/17/2022 0939   CALCIUM 9.4 03/17/2022 0939   GFRNONAA 83.12 09/21/2009 0847    BNP No results found for: "BNP"  ProBNP No results found for: "PROBNP"  Imaging: No results found.   Assessment & Plan:   Severe sleep apnea - HST on 06/26/2022 showed evidence of severe obstructive sleep apnea, AHI 36.1 an hour with SpO2 low 85% (average 93%) - Patient is compliant with CPAP use and has seen some benefit in blood pressure readings and weight loss. He has been having issues with mask fit and condensation in his mask - Pressure 5-15cm h20; Residual AHI 9.5/hour  Recommendations: - Look at lowering humidification  - If sleep with your mouth open, ask [employer] to provide you chin strap  - Continue to wear CPAP nightly 4-6 hours or more  - Continue to sleep on your side, if you do sleep on your back try using a wedge pillow to elevate your head   Orders: - Change cpap pressure 10cm h20   Follow-up: - 3 months with Beth NP      [name], NP 09/11/2022

## 2022-09-06 NOTE — Assessment & Plan Note (Addendum)
-   HST on 06/26/2022 showed evidence of severe obstructive sleep apnea, AHI 36.1 an hour with SpO2 low 85% (average 93%) - Patient is compliant with CPAP use and has seen some benefit in blood pressure readings and weight loss. He has been having issues with mask fit and condensation in his mask - Pressure 5-15cm h20; Residual AHI 9.5/hour  Recommendations: - Look at lowering humidification  - If sleep with your mouth open, ask [employer] to provide you chin strap  - Continue to wear CPAP nightly 4-6 hours or more  - Continue to sleep on your side, if you do sleep on your back try using a wedge pillow to elevate your head   Orders: - Change cpap pressure 10cm h20   Follow-up: - 3 months with Waynetta Sandy NP

## 2022-09-06 NOTE — Patient Instructions (Addendum)
Recommendations: - Look at lowering humidification  - If sleep with your mouth open, ask [employer] to provide you chin strap  - Continue to wear CPAP nightly 4-6 hours or more  - Continue to sleep on your side, if you do sleep on your back try using a wedge pillow to elevate your head   Orders: - Change cpap pressure 10cm h20   Follow-up: - 3 months with Beth NP   CPAP and BIPAP Information CPAP and BIPAP are methods that use air pressure to keep your airways open and to help you breathe well. CPAP and BIPAP use different amounts of pressure. Your health care provider will tell you whether CPAP or BIPAP would be more helpful for you. CPAP stands for "continuous positive airway pressure." With CPAP, the amount of pressure stays the same while you breathe in (inhale) and out (exhale). BIPAP stands for "bi-level positive airway pressure." With BIPAP, the amount of pressure will be higher when you inhale and lower when you exhale. This allows you to take larger breaths. CPAP or BIPAP may be used in the hospital, or your health care provider may want you to use it at home. You may need to have a sleep study before your health care provider can order a machine for you to use at home. What are the advantages? CPAP or BIPAP can be helpful if you have: Sleep apnea. Chronic obstructive pulmonary disease (COPD). Heart failure. Medical conditions that cause muscle weakness, including muscular dystrophy or amyotrophic lateral sclerosis (ALS). Other problems that cause breathing to be shallow, weak, abnormal, or difficult. CPAP and BIPAP are most commonly used for obstructive sleep apnea (OSA) to keep the airways from collapsing when the muscles relax during sleep. What are the risks? Generally, this is a safe treatment. However, problems may occur, including: Irritated skin or skin sores if the mask does not fit properly. Dry or stuffy nose or nosebleeds. Dry mouth. Feeling gassy or bloated. Sinus or  lung infection if the equipment is not cleaned properly. When should CPAP or BIPAP be used? In most cases, the mask only needs to be worn during sleep. Generally, the mask needs to be worn throughout the night and during any daytime naps. People with certain medical conditions may also need to wear the mask at other times, such as when they are awake. Follow instructions from your health care provider about when to use the machine. What happens during CPAP or BIPAP?  Both CPAP and BIPAP are provided by a small machine with a flexible plastic tube that attaches to a plastic mask that you wear. Air is blown through the mask into your nose or mouth. The amount of pressure that is used to blow the air can be adjusted on the machine. Your health care provider will set the pressure setting and help you find the best mask for you. Tips for using the mask Because the mask needs to be snug, some people feel trapped or closed-in (claustrophobic) when first using the mask. If you feel this way, you may need to get used to the mask. One way to do this is to hold the mask loosely over your nose or mouth and then gradually apply the mask more snugly. You can also gradually increase the amount of time that you use the mask. Masks are available in various types and sizes. If your mask does not fit well, talk with your health care provider about getting a different one. Some common types of masks  include: Full face masks, which fit over the mouth and nose. Nasal masks, which fit over the nose. Nasal pillow or prong masks, which fit into the nostrils. If you are using a mask that fits over your nose and you tend to breathe through your mouth, a chin strap may be applied to help keep your mouth closed. Use a skin barrier to protect your skin as told by your health care provider. Some CPAP and BIPAP machines have alarms that may sound if the mask comes off or develops a leak. If you have trouble with the mask, it is very  important that you talk with your health care provider about finding a way to make the mask easier to tolerate. Do not stop using the mask. There could be a negative impact on your health if you stop using the mask. Tips for using the machine Place your CPAP or BIPAP machine on a secure table or stand near an electrical outlet. Know where the on/off switch is on the machine. Follow instructions from your health care provider about how to set the pressure on your machine and when you should use it. Do not eat or drink while the CPAP or BIPAP machine is on. Food or fluids could get pushed into your lungs by the pressure of the CPAP or BIPAP. For home use, CPAP and BIPAP machines can be rented or purchased through home health care companies. Many different brands of machines are available. Renting a machine before purchasing may help you find out which particular machine works well for you. Your health insurance company may also decide which machine you may get. Keep the CPAP or BIPAP machine and attachments clean. Ask your health care provider for specific instructions. Check the humidifier if you have a dry stuffy nose or nosebleeds. Make sure it is working correctly. Follow these instructions at home: Take over-the-counter and prescription medicines only as told by your health care provider. Ask if you can take sinus medicine if your sinuses are blocked. Do not use any products that contain nicotine or tobacco. These products include cigarettes, chewing tobacco, and vaping devices, such as e-cigarettes. If you need help quitting, ask your health care provider. Keep all follow-up visits. This is important. Contact a health care provider if: You have redness or pressure sores on your head, face, mouth, or nose from the mask or head gear. You have trouble using the CPAP or BIPAP machine. You cannot tolerate wearing the CPAP or BIPAP mask. Someone tells you that you snore even when wearing your CPAP or  BIPAP. Get help right away if: You have trouble breathing. You feel confused. Summary CPAP and BIPAP are methods that use air pressure to keep your airways open and to help you breathe well. If you have trouble with the mask, it is very important that you talk with your health care provider about finding a way to make the mask easier to tolerate. Do not stop using the mask. There could be a negative impact to your health if you stop using the mask. Follow instructions from your health care provider about when to use the machine. This information is not intended to replace advice given to you by your health care provider. Make sure you discuss any questions you have with your health care provider. Document Revised: 12/08/2020 Document Reviewed: 04/09/2020 Elsevier Patient Education  2023 [employer].

## 2022-09-11 DIAGNOSIS — G4733 Obstructive sleep apnea (adult) (pediatric): Secondary | ICD-10-CM

## 2022-09-11 NOTE — Progress Notes (Signed)
Reviewed and agree with assessment/plan.   Jatavius Ellenwood, MD [facility] 09/11/2022, 8:26 AM Pager:  [phone]  

## 2022-09-12 NOTE — Telephone Encounter (Signed)
I have placed an order to lower CPAP pressure to 8cm h20

## 2022-09-12 NOTE — Telephone Encounter (Signed)
I have received the following message   "Pressure increase is uncomfortable, can you reduce by 1/2 , ?? "   Beth please advise, thank you

## 2022-09-19 NOTE — Telephone Encounter (Signed)
I have never seen this patient - this is a sleep patient seen by [facility]. Please send to [facility] or provider of day.

## 2022-09-20 NOTE — Progress Notes (Signed)
Initial neurology clinic note  Jordan Cantrell MRN: [id number] DOB: February 12, 1961  Referring provider: Shelva Majestic, MD  Primary care provider: Shelva Majestic, MD  Reason for consult:  headaches  Subjective:  This is Mr. Jordan Cantrell, a 62 y.o. right-handed male with a medical history of severe OSA, HTN, HLD, insomnia, OA, former smoker who presents to neurology clinic with headaches. The patient is alone today.  Patient has migraines since at least the age of 57. The pain was previously so bad that he would seek treatment in the ED. Things improved over the last 1-2 decades though. The pain is on the right side of his face starting behind the eye. It is a soreness feeling. The pain is rated 2-3/10. He endorses photophobia, phonophobia, nausea, but no vomiting. When he gets a headache, he will go home and rest in a dark room with no sound. The symptoms will decrease and resolve in 30-45 minutes. He has 2-3 headaches per week. He mentions he may not ever be completely symptom free.   He endorses aura that prevents function. Previously this was tingling in his hands. Currently it is a colorful lightening bolt, semicircular images, and blurry vision in the peripheral vision. He will take sumatriptan if his aura will not go away, but not with every headache.  Headaches do not wake him up from sleep. He does not have them when first waking, until he started CPAP.  Lately he feels the frequency of his migraines have increased (over the last few weeks). It may have increased with recent start of CPAP. As a result, he has stopped CPAP. Patient was also started on atorvastatin but stopped this a couple of weeks ago due to this being a new medication and wondering if this was contributing. He was previously taking sudafed every day due to congestion. He is now doing netty pot every day.  Patient has changed his diet significantly due to some CAD. He has not seen a difference in his headaches  since change in diet which was a few months ago.  He feels like bright lights are a trigger. He denies worsening with position (laying or standing) or with straining. He may have some tearing in the left eye, but no running nose, ptosis, facial pallor/flushing/sweating, or aural fullness.  Family history of headaches: No  Social history: Caffiene use: 1-2 cups of coffee every morning Tobacco use: dip tobacco EtOH use: 1-2 mixed drinks per day Drug use: No Sleep: Unclear - patient has difficulty assessing Mood: Generally okay, but can be down when he has a lot of migraines  Current headache medications (including over the counter): Sumatriptan - gets 6 per month, usually does not use them all - generally helps with headaches/aura Diclofenac for OA (1 every day)  Metoprolol 100 mg daily (started for migraine prevention decades ago, but also for HTN) Magnesium 420 mg - just started this week  Previous medications tried (effectiveness, side effects, why stopped):  None   At the end of the appointment, patient also pulled out a list of other complaints including leg twitches/cramps, and tinnitus. We did not fully discuss today.  MEDICATIONS:  Outpatient Encounter Medications as of 09/28/2022  Medication Sig   amLODipine (NORVASC) 10 MG tablet TAKE 1 TABLET BY MOUTH EVERY DAY   betamethasone dipropionate 0.05 % cream PRN   diclofenac (VOLTAREN) 75 MG EC tablet DAILY   hydrocortisone 2.5 % cream PLEASE SEE ATTACHED FOR DETAILED DIRECTIONS   metoprolol succinate (  TOPROL-XL) 50 MG 24 hr tablet TAKE 2 TABLETS BY MOUTH EVERY DAY   metroNIDAZOLE (METROGEL) 0.75 % gel Apply topically daily. PRN   Multiple Vitamin (MULTI VITAMIN PO) Take by mouth.   pramoxine-hydrocortisone (ANALPRAM HC) cream Apply topically in the morning and at bedtime. (Patient taking differently: Apply topically in the morning and at bedtime. PRN)   SUMAtriptan (IMITREX) 50 MG tablet TAKE 1 TABLET BY MOUTH AS NEEDED  MIGRAINE   tadalafil (CIALIS) 10 MG tablet Take 1 tablet (10 mg total) by mouth every 3 (three) days as needed for erectile dysfunction.   topiramate (TOPAMAX) 25 MG tablet Take 1 tablet (25 mg total) by mouth daily.   triamcinolone cream (KENALOG) 0.5 % Apply 1 application topically 2 (two) times daily. For 10 days to affected area (Patient taking differently: Apply 1 application  topically 2 (two) times daily. PRN)   Vitamin D, Ergocalciferol, (DRISDOL) 1.25 MG (50000 UNIT) CAPS capsule Take 1 capsule (50,000 Units total) by mouth every 7 (seven) days.   atorvastatin (LIPITOR) 40 MG tablet Take 1 tablet (40 mg total) by mouth every evening. (Patient not taking: Reported on 09/25/2022)   diphenhydrAMINE (BENADRYL) 25 MG tablet Take 25 mg by mouth every 6 (six) hours as needed for itching. (Patient not taking: Reported on 09/25/2022)   No facility-administered encounter medications on file as of 09/28/2022.    PAST MEDICAL HISTORY: Past Medical History:  Diagnosis Date   Allergic rhinitis, cause unspecified    to tick bite - resolved   Arthritis    knees, hands   Balanoposthitis    Mild   Chest pain, unspecified    2007- stress test normal   Constipation 09/24/2009   Prn metamucil  - occasional   Dry eye syndrome    GERD (gastroesophageal reflux disease)    Hearing loss    slight - bilateral - no hearing aids   HEMORRHOID, EXTERNAL, THROMBOSED 05/06/2010   Qualifier: Diagnosis of  By: Yetta Barre MD, Bernadene Bell.    History of shingles    Hypertension    Migraine, unspecified, without mention of intractable migraine without mention of status migrainosus    Severe sleep apnea 07/07/2022   TINNITUS NOS 01/31/2008   Qualifier: Diagnosis of  By: Posey Rea MD, Georgina Quint     PAST SURGICAL HISTORY: Past Surgical History:  Procedure Laterality Date   COLONOSCOPY  05/16/2011    hx polyps    FOOT FRACTURE SURGERY  05/16/1991   right-crushed all 4 toes reportedly   KNEE ARTHROSCOPY  05/15/2009    Right for Condromalacia   ORIF FEMUR FRACTURE  05/16/1991   Right   POLYPECTOMY     WISDOM TOOTH EXTRACTION      ALLERGIES: Allergies  Allergen Reactions   Codeine Nausea Only    FAMILY HISTORY: Family History  Problem Relation Age of Onset   Diabetes type II Mother    Heart disease Mother        CABG-died during this, smoker   Hyperlipidemia Mother    Hypertension Mother    Diabetes Father    Heart disease Father        thought death due to this, smoker   Hypertension Father    Schizophrenia Father    Colon polyps Sister    Colon cancer Neg Hx    Stomach cancer Neg Hx    Esophageal cancer Neg Hx    Rectal cancer Neg Hx    Crohn's disease Neg Hx     SOCIAL  HISTORY: Social History   Tobacco Use   Smoking status: Former    Packs/day: .1    Types: Cigarettes    Quit date: 04/28/2014    Years since quitting: 8.4    Passive exposure: Past ("PARENTS SMOKED")   Smokeless tobacco: Current    Types: Snuff   Tobacco comments:    Reported Dipping at this time- 09/28/22  Vaping Use   Vaping Use: Never used  Substance Use Topics   Alcohol use: Yes    Alcohol/week: 10.0 - 12.0 standard drinks of alcohol    Types: 10 - 12 Cans of beer per week    Comment: beer/liquor   Drug use: No   Social History   Social History Narrative   Married-'81. 1 dtr - '87, 1 son - '83, 2 granddaughters   Marriage in good health.       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       Hobbies: time with grandkids, work with cows, minimal travel due to pain   Are you right handed or left handed? right   Are you currently employed ?    What is your current occupation?   Do you live at home alone?no   Who lives with you? wife   What type of home do you live in: 1 story or 2 story? two    Caffeine 1 cup daily     Objective:  Vital Signs:  BP 121/63 (BP Location: Left Arm, Patient Position: Sitting, Cuff Size: Normal)   Pulse 72   Ht 5\' 6"  (1.676 m)   Wt 170 lb (77.1 kg)   SpO2 94%   BMI 27.44 kg/m   General: No acute distress.  Patient appears well-groomed.   Head:  Normocephalic/atraumatic Eyes:  fundi examined, not fully visualized, but no obvious papilledema Neck: supple, no paraspinal tenderness, reduced range of motion Heart: regular rate and rhythm Lungs: Clear to auscultation bilaterally. Vascular: No carotid bruits.  Neurological Exam: Mental status: alert and oriented, speech fluent and not dysarthric, language intact.  Cranial nerves: CN I: not tested CN II: pupils equal, round and reactive to light, visual fields intact CN III, IV, VI:  full range of motion, no nystagmus, no ptosis CN V: facial sensation intact. CN VII: upper and lower face symmetric CN VIII: hearing intact CN IX, X: uvula midline CN XI: sternocleidomastoid and trapezius muscles intact CN XII: tongue midline  Bulk & Tone: normal Motor:  muscle strength 5/5 throughout Deep Tendon Reflexes:  2+ throughout Sensation:  Light touch sensation intact. Finger to nose testing:  Without dysmetria.   Heel to shin:  Without dysmetria Gait:  Normal station and stride.  Reduced arm swing.   Labs and Imaging review: Internal labs: Lab Results  Component Value Date   HGBA1C 5.7 09/25/2022   No results found for: "VITAMINB12" Lab Results  Component Value Date   TSH 1.63 09/15/2021   CBC (03/17/22) unremarkable CMP (03/17/22) unremarkable  Assessment/Plan:  SEDGWICK TRUNDLE is a 63 y.o. male who presents for evaluation of headaches. He has a relevant medical history of severe OSA, HTN, HLD, insomnia, OA, former smoker. His neurological examination is essentially normal today. Patient's symptoms sound consistent with long history of chronic migraine with aura. He is currently getting 2-3 headaches per week,  which is an increase from prior. The intensity of the headaches are bad per patient. He has been on metoprolol which does not seem to be helping prevent migraine. Sumatriptan is affective at headache abortion per patient.  PLAN: -Blood work: B12 -CT head wo contrast due change in headache frequency -Discussed OSA and the importance of CPAP - patient will discuss with sleep doctor -Discussed atorvastatin. I do not think this is contributing to his migraines and certainly recommend some cholesterol control. Patient to discuss with PCP. -See ophtho for dilated fundoscopy to ensure no papilledema given that I could not fully evaluate on non-dilated exam -Vit D 1000 IU daily -For migraines: Migraine prevention:  Topamax 25 mg daily - may have to increase pending response. Continue metoprolol 100 mg daily, but likely for cardiac reasons as it does not seem to be helping headache. Migraine rescue:  Sumatriptan 50 mg PRN at headache onset. Limit use of pain relievers to no more than 2 days out of week to prevent risk of rebound or medication-overuse headache. Keep headache diary  -Patient encouraged to reach out in 3-4 weeks if there is no change in headaches or with any change or worsening in symptoms  -Return to clinic in 3 months  The impression above as well as the plan as outlined below were extensively discussed with the patient who voiced understanding. All questions were answered to their satisfaction.  When available, results of the above investigations and possible further recommendations will be communicated to the patient via telephone/MyChart. Patient to call office if not contacted after expected testing turnaround time.   Total time spent reviewing records, interview, history/exam, documentation, and coordination of care on day of encounter:  70 min   Thank you for allowing me to participate in patient's care.  If I can answer any additional questions, I would be pleased to do  so.  Jacquelyne Balint, MD   CC: Durene Cal Aldine Contes, MD [address] [place] [address]  CC: Referring provider: Shelva Majestic, MD [address] [place],  [place] [address]

## 2022-09-20 NOTE — Telephone Encounter (Signed)
Migraines are not typical with CPAP use but we can try a short time off to see if they improve. Recommend he focus on side sleeping position or get a wedge pillow to sleep at an elevation. Dont drink alcohol prior to bedtime. Dont drive if tired. Keep apt with neurology. If headaches improve off CPAP would recommend we refer to ENT to discuss alternative treatment options for his OSA such as inspire device.

## 2022-09-25 ENCOUNTER — Ambulatory Visit (INDEPENDENT_AMBULATORY_CARE_PROVIDER_SITE_OTHER): Payer: 59 | Admitting: Family Medicine

## 2022-09-25 ENCOUNTER — Encounter: Payer: Self-pay | Admitting: Family Medicine

## 2022-09-25 VITALS — BP 110/78 | HR 68 | Temp 97.8°F | Ht 66.0 in | Wt 169.2 lb

## 2022-09-25 DIAGNOSIS — Z0000 Encounter for general adult medical examination without abnormal findings: Secondary | ICD-10-CM

## 2022-09-25 DIAGNOSIS — E785 Hyperlipidemia, unspecified: Secondary | ICD-10-CM | POA: Diagnosis not present

## 2022-09-25 DIAGNOSIS — Z125 Encounter for screening for malignant neoplasm of prostate: Secondary | ICD-10-CM

## 2022-09-25 DIAGNOSIS — R931 Abnormal findings on diagnostic imaging of heart and coronary circulation: Secondary | ICD-10-CM | POA: Diagnosis not present

## 2022-09-25 DIAGNOSIS — R5383 Other fatigue: Secondary | ICD-10-CM | POA: Diagnosis not present

## 2022-09-25 DIAGNOSIS — Z91014 Allergy to mammalian meats: Secondary | ICD-10-CM | POA: Diagnosis not present

## 2022-09-25 DIAGNOSIS — I10 Essential (primary) hypertension: Secondary | ICD-10-CM

## 2022-09-25 DIAGNOSIS — Z720 Tobacco use: Secondary | ICD-10-CM | POA: Diagnosis not present

## 2022-09-25 DIAGNOSIS — G43109 Migraine with aura, not intractable, without status migrainosus: Secondary | ICD-10-CM

## 2022-09-25 DIAGNOSIS — R739 Hyperglycemia, unspecified: Secondary | ICD-10-CM

## 2022-09-25 DIAGNOSIS — Z131 Encounter for screening for diabetes mellitus: Secondary | ICD-10-CM

## 2022-09-25 DIAGNOSIS — I700 Atherosclerosis of aorta: Secondary | ICD-10-CM

## 2022-09-25 LAB — COMPREHENSIVE METABOLIC PANEL
ALT: 54 U/L — ABNORMAL HIGH (ref 0–53)
AST: 38 U/L — ABNORMAL HIGH (ref 0–37)
Albumin: 5 g/dL (ref 3.5–5.2)
Alkaline Phosphatase: 58 U/L (ref 39–117)
BUN: 11 mg/dL (ref 6–23)
CO2: 28 mEq/L (ref 19–32)
Calcium: 10 mg/dL (ref 8.4–10.5)
Chloride: 103 mEq/L (ref 96–112)
Creatinine, Ser: 1.02 mg/dL (ref 0.40–1.50)
GFR: 78.52 mL/min (ref >60.00)
Glucose, Bld: 105 mg/dL — ABNORMAL HIGH (ref 70–99)
Potassium: 4 mEq/L (ref 3.5–5.1)
Sodium: 141 mEq/L (ref 135–145)
Total Bilirubin: 0.9 mg/dL (ref 0.2–1.2)
Total Protein: 7.6 g/dL (ref 6.0–8.3)

## 2022-09-25 LAB — URINALYSIS, ROUTINE W REFLEX MICROSCOPIC
Bilirubin Urine: NEGATIVE
Hgb urine dipstick: NEGATIVE
Ketones, ur: NEGATIVE
Leukocytes,Ua: NEGATIVE
Nitrite: NEGATIVE
RBC / HPF: NONE SEEN (ref 0–?)
Specific Gravity, Urine: <=1.005 — AB (ref 1.000–1.030)
Total Protein, Urine: NEGATIVE
Urine Glucose: NEGATIVE
Urobilinogen, UA: 0.2 (ref 0.0–1.0)
WBC, UA: NONE SEEN (ref 0–?)
pH: 6 (ref 5.0–8.0)

## 2022-09-25 LAB — CBC WITH DIFFERENTIAL/PLATELET
Basophils Absolute: 0 10*3/uL (ref 0.0–0.1)
Basophils Relative: 0.6 % (ref 0.0–3.0)
Eosinophils Absolute: 0.1 10*3/uL (ref 0.0–0.7)
Eosinophils Relative: 1.4 % (ref 0.0–5.0)
HCT: 49.6 % (ref 39.0–52.0)
Hemoglobin: 17.2 g/dL — ABNORMAL HIGH (ref 13.0–17.0)
Lymphocytes Relative: 26.2 % (ref 12.0–46.0)
Lymphs Abs: 1.4 10*3/uL (ref 0.7–4.0)
MCHC: 34.7 g/dL (ref 30.0–36.0)
MCV: 94.3 fl (ref 78.0–100.0)
Monocytes Absolute: 0.6 10*3/uL (ref 0.1–1.0)
Monocytes Relative: 11.8 % (ref 3.0–12.0)
Neutro Abs: 3.2 10*3/uL (ref 1.4–7.7)
Neutrophils Relative %: 60 % (ref 43.0–77.0)
Platelets: 175 10*3/uL (ref 150.0–400.0)
RBC: 5.26 Mil/uL (ref 4.22–5.81)
RDW: 12.4 % (ref 11.5–15.5)
WBC: 5.3 10*3/uL (ref 4.0–10.5)

## 2022-09-25 LAB — LDL CHOLESTEROL, DIRECT: Direct LDL: 118 mg/dL

## 2022-09-25 LAB — LIPID PANEL
Cholesterol: 186 mg/dL (ref 0–200)
HDL: 45.4 mg/dL (ref >39.00)
NonHDL: 140.49
Total CHOL/HDL Ratio: 4
Triglycerides: 222 mg/dL — ABNORMAL HIGH (ref 0.0–149.0)
VLDL: 44.4 mg/dL — ABNORMAL HIGH (ref 0.0–40.0)

## 2022-09-25 LAB — HEMOGLOBIN A1C: Hgb A1c MFr Bld: 5.7 % (ref 4.6–6.5)

## 2022-09-25 LAB — PSA: PSA: 0.22 ng/mL (ref 0.10–4.00)

## 2022-09-25 LAB — VITAMIN D 25 HYDROXY (VIT D DEFICIENCY, FRACTURES): VITD: 21.2 ng/mL — ABNORMAL LOW (ref 30.00–100.00)

## 2022-09-25 NOTE — Patient Instructions (Addendum)
Health Maintenance Due  Topic Date Due   Zoster Vaccines- Shingrix (1 of 2) Never done   Please stop by lab before you go If you have mychart- we will send your results within 3 business days of [place] receiving them.  If you do not have mychart- we will call you about results within 5 business days of [place] receiving them.  *please also note that you will see labs on mychart as soon as they post. I will later go in and write notes on them- will say "notes from Dr. Durene Cal"   Recommended follow up: Return in about 6 months (around 03/28/2023) for followup or sooner if needed.Schedule b4 you leave.

## 2022-09-25 NOTE — Progress Notes (Signed)
Phone: [phone]   Subjective:  Patient presents today for their annual physical. Chief complaint-noted.   See problem oriented charting- ROS- full  review of systems was completed and negative  except for: tinnitus, light sensitivity with migraines, aura before migraines, urinary frequency, migraines  The following were reviewed and entered/updated in epic: Past Medical History:  Diagnosis Date   Allergic rhinitis, cause unspecified    to tick bite - resolved   Arthritis    knees, hands   Balanoposthitis    Mild   Chest pain, unspecified    2007- stress test normal   Constipation 09/24/2009   Prn metamucil  - occasional   Dry eye syndrome    GERD (gastroesophageal reflux disease)    Hearing loss    slight - bilateral - no hearing aids   HEMORRHOID, EXTERNAL, THROMBOSED 05/06/2010   Qualifier: Diagnosis of  By: Yetta Barre MD, Bernadene Bell.    History of shingles    Hypertension    Migraine, unspecified, without mention of intractable migraine without mention of status migrainosus    Severe sleep apnea 07/07/2022   TINNITUS NOS 01/31/2008   Qualifier: Diagnosis of  By: Posey Rea MD, Georgina Quint    Patient Active Problem List   Diagnosis Date Noted   Agatston CAC score, >400 07/26/2022    Priority: High   Allergy to beef 02/17/2016    Priority: High   Tobacco abuse 11/02/2011    Priority: High   Aortic atherosclerosis (HCC) 07/26/2022    Priority: Medium    Hyperglycemia 03/17/2022    Priority: Medium    Hyperlipidemia, unspecified 09/30/2020    Priority: Medium    Insomnia 11/12/2012    Priority: Medium    Hypertension 11/12/2012    Priority: Medium    Migraine 09/30/2007    Priority: Medium    Osteoarthritis 07/22/2014    Priority: Low   History of colonic polyps 07/22/2014    Priority: Low   Urinary frequency 09/11/2011    Priority: Low   LOW BACK PAIN SYNDROME 09/24/2009    Priority: Low   Dry eyes 09/30/2007    Priority: Low   Allergic rhinitis 09/30/2007     Priority: Low   History of shingles 09/30/2007    Priority: Low   Severe sleep apnea 07/07/2022   Loud snoring 04/05/2022   Hemorrhoid 10/30/2014   Tinnitus, bilateral 01/31/2008   Past Surgical History:  Procedure Laterality Date   COLONOSCOPY  05/16/2011    hx polyps    FOOT FRACTURE SURGERY  05/16/1991   right-crushed all 4 toes reportedly   KNEE ARTHROSCOPY  05/15/2009   Right for Condromalacia   ORIF FEMUR FRACTURE  05/16/1991   Right   POLYPECTOMY     WISDOM TOOTH EXTRACTION      Family History  Problem Relation Age of Onset   Diabetes type II Mother    Heart disease Mother        CABG-died during this, smoker   Hyperlipidemia Mother    Hypertension Mother    Diabetes Father    Heart disease Father        thought death due to this, smoker   Hypertension Father    Schizophrenia Father    Colon polyps Sister    Colon cancer Neg Hx    Stomach cancer Neg Hx    Esophageal cancer Neg Hx    Rectal cancer Neg Hx    Crohn's disease Neg Hx     Medications- reviewed and updated Current  Outpatient Medications  Medication Sig Dispense Refill   amLODipine (NORVASC) 10 MG tablet TAKE 1 TABLET BY MOUTH EVERY DAY 90 tablet 3   betamethasone dipropionate 0.05 % cream PRN     diclofenac (VOLTAREN) 75 MG EC tablet DAILY     hydrocortisone 2.5 % cream PLEASE SEE ATTACHED FOR DETAILED DIRECTIONS     metoprolol succinate (TOPROL-XL) 50 MG 24 hr tablet TAKE 2 TABLETS BY MOUTH EVERY DAY 180 tablet 3   metroNIDAZOLE (METROGEL) 0.75 % gel Apply topically daily. PRN     Multiple Vitamin (MULTI VITAMIN PO) Take by mouth.     pramoxine-hydrocortisone (ANALPRAM HC) cream Apply topically in the morning and at bedtime. (Patient taking differently: Apply topically in the morning and at bedtime. PRN) 57 g 3   SUMAtriptan (IMITREX) 50 MG tablet TAKE 1 TABLET BY MOUTH AS NEEDED MIGRAINE 6 tablet 10   triamcinolone cream (KENALOG) 0.5 % Apply 1 application topically 2 (two) times daily. For  10 days to affected area (Patient taking differently: Apply 1 application  topically 2 (two) times daily. PRN) 30 g 0   atorvastatin (LIPITOR) 40 MG tablet Take 1 tablet (40 mg total) by mouth every evening. (Patient not taking: Reported on 09/25/2022) 30 tablet 11   diphenhydrAMINE (BENADRYL) 25 MG tablet Take 25 mg by mouth every 6 (six) hours as needed for itching. (Patient not taking: Reported on 09/25/2022)     tadalafil (CIALIS) 10 MG tablet Take 1 tablet (10 mg total) by mouth every 3 (three) days as needed for erectile dysfunction. (Patient not taking: Reported on 09/25/2022) 10 tablet 3   No current facility-administered medications for this visit.    Allergies-reviewed and updated Allergies  Allergen Reactions   Codeine Nausea Only    Social History   Social History Narrative   Married-'81. 1 dtr - '87, 1 son - '83, 2 granddaughters   Marriage in good health.       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       Hobbies: time with grandkids, work with cows, minimal travel due to pain   Objective  Objective:  BP 110/78   Pulse 68   Temp 97.8 F (36.6 C)   Ht 5\' 6"  (1.676 m)   Wt 169 lb 3.2 oz (76.7 kg)   SpO2 97%   BMI 27.31 kg/m  Gen: NAD, resting comfortably HEENT: Mucous membranes are moist. Oropharynx normal Neck: no thyromegaly CV: RRR no murmurs rubs or gallops Lungs: CTAB no crackles, wheeze, rhonchi Abdomen: soft/nontender/nondistended/normal bowel sounds. No rebound or guarding.  Ext: no edema Skin: warm, dry Neuro: grossly normal, moves all extremities, PERRLA    Assessment and Plan  62 y.o. male presenting for annual physical.  Health Maintenance counseling: 1. Anticipatory guidance: Patient counseled regarding regular dental exams -q6 months, eye exams -yearly 6 weeks ago,  avoiding  smoking and second hand smoke-advised to stop dipping- advised against with migraines , limiting alcohol to 2 beverages per day - 10 a week but has cut down and wants to continue to do so for migraines, no illicit drugs .   2. Risk factor reduction:  Advised patient of need for regular exercise and diet rich and fruits and vegetables to reduce risk of heart attack and stroke.  Exercise- 3 days in a  row walking 30 minutes recently- trying to make a regular pattern.  Diet/weight management-down 7 lbs from last year- advised continued gradual weight loss.  Wt Readings from Last 3 Encounters:  09/25/22 169 lb 3.2 oz (76.7 kg)  09/06/22 171 lb (77.6 kg)  08/14/22 169 lb 6.4 oz (76.8 kg)  3. Immunizations/screenings/ancillary studies- declines COVID and shingrix   Immunization History  Administered Date(s) Administered   Influenza,inj,Quad PF,6+ Mos 02/17/2016, 03/21/2017, 03/01/2018, 03/19/2019, 03/04/2020   Influenza,inj,quad, With Preservative 02/28/2018   Influenza-Unspecified 03/29/2014, 02/28/2018   Tdap 11/12/2013  4. Prostate cancer screening- low risk prior trend-continue to monitor with PSA.  BPH on prior exam and nocturia once a night stable- occasionally more  Lab Results  Component Value Date   PSA 0.17 09/15/2021   PSA 0.23 09/30/2020   PSA 0.17 03/19/2019   5. Colon cancer screening - completed in 2023 and spaced out to 10-year follow-up with reassuring exam 6. Skin cancer screening- Dr. Yetta Barre has seen him yearly typically. advised regular sunscreen use. Denies worrisome, changing, or new skin lesions.   7. Smoking associated screening (lung cancer screening, AAA screen 65-75, UA)- former smoker- quit 2015. Will get UA.  8. STD screening - monogamous with wife- opts out  Status of chronic or acute concerns   #Hypertension S: Compliant with amlodipine 10 mg, metoprolol 100 mg extended release BP Readings from Last 3 Encounters:  09/25/22 110/78  09/06/22 116/64  08/14/22  130/78  A/P: stable- continue current medicines   - considered reducing amlodipine but we ultimately opted to wait until neurology visit in case there are other changes   #Hyperlipidemia-normal exercise tolerance test 03/24/2021.   S: Medication: None prior to CT calcium scoring-started on atorvastatin 40 mg but he felt it worsened migraines so he stopped. Didn't tolerate rosuvatatin either - concerned on migraines -CT calcium at 400 march 2024 Lab Results  Component Value Date   CHOL 194 09/15/2021   HDL 43.50 09/15/2021   LDLCALC 81 07/28/2014   LDLDIRECT 126.0 09/15/2021   TRIG 260.0 (H) 09/15/2021   CHOLHDL 4 09/15/2021  A/P: lipids are likely somewhat high- he wants to work on lifestyle changes- plant based whole food diet is good for this -gave info on nutritionfacts.org  -doesn't think he could go vegan  # Alpha gal S:contracted around 2017 we believe. Alpha gal labs have improved but not completely normalized.   A/P: wants to updated levels- still encouraged to avoid beef due to risks   #Migraines S: Compliant with sumatriptan-uses this and helpful but if has to take 3 in a week feels exhausted  - he felt like atorvastatin worsened migraines -cut down on 90% of bacon/sausage as well and has increased yogurt and oatmeal- has felt helpful- severity is lower and frequency 2-3 days a week so still higher than in the past  -sun is a trigger and certain lights A/P: upcoming visit with neurology thankfully with worsening frquency- glad he is scheduled in 3 days  - continue current medications for now   #Polycythemia-former smoker-has not smoked since 2015 but does get a lot of smoke exposure at work.- improved last year- we will monitor Lab Results  Component Value Date   WBC 6.7 03/17/2022   HGB 16.7 03/17/2022   HCT 48.5 03/17/2022   MCV 94.5 03/17/2022   PLT 152.0 03/17/2022   #Osteoarthritis- Left knee, neck.  -Celebrex, meloxicam in past  -diclofenac currently  through  orthopedic- he wants to try every other day - Needs  q6 month BMP on nsaids   # Hyperglycemia/insulin resistance/prediabetes- a1c 5.19 Sep 2021 S:  Medication: none Lab Results  Component Value Date   HGBA1C 6.1 03/17/2022   HGBA1C 5.8 09/15/2021    A/P: hopefully stable or improved update a1c today. Continue without meds for now   #OSA - managed by pulmonary - #s improving on consistency  #Tobacco abuse- still dips- former smoker- encouraged cessation   #Twitch through lower half of hamstring when he is at rest- better with activity. Hydration is good. Seems to be worse with dietary changes for migraine- possible electrolyte issue as has cut down on sodium for instance  #fatigue with twitching- we opted to check vitamin D - he knows he may have a cost  Recommended follow up: Return in about 6 months (around 03/28/2023) for followup or sooner if needed.Schedule b4 you leave. Future Appointments  Date Time Provider Department Center  09/28/2022  8:00 AM Antony Madura, MD [facility] None  10/24/2022 10:00 AM [facility]  12/07/2022  8:30 AM [name], NP [facility] None   Lab/Order associations: fasting other than sugar and cream in coffee   ICD-10-CM   1. Preventative health care  Z00.00 Comprehensive metabolic panel    CBC with Differential/Platelet    Lipid panel    Hemoglobin A1c    PSA    Alpha-Gal Panel    VITAMIN D 25 Hydroxy (Vit-D Deficiency, Fractures)    Urinalysis, Routine w reflex microscopic    2. Agatston CAC score, >400  R93.1     3. Allergy to beef  Z91.014 Alpha-Gal Panel    4. Tobacco abuse  Z72.0     5. Aortic atherosclerosis (HCC)  I70.0     6. Hyperlipidemia, unspecified hyperlipidemia type  E78.5 Comprehensive metabolic panel    CBC with Differential/Platelet    Lipid panel    Urinalysis, Routine w reflex microscopic    7. Hyperglycemia  R73.9 Hemoglobin A1c    8. Primary hypertension  I10 Urinalysis, Routine  w reflex microscopic    9. Migraine with aura and without status migrainosus, not intractable  G43.109     10. Screening for diabetes mellitus  Z13.1 Hemoglobin A1c    11. Screening for prostate cancer  Z12.5 PSA    12. Fatigue, unspecified type  R53.83 VITAMIN D 25 Hydroxy (Vit-D Deficiency, Fractures)      No orders of the defined types were placed in this encounter.   Return precautions advised.  Tana Conch, MD

## 2022-09-26 ENCOUNTER — Other Ambulatory Visit: Payer: Self-pay | Admitting: Family Medicine

## 2022-09-26 MED ORDER — VITAMIN D (ERGOCALCIFEROL) 1.25 MG (50000 UNIT) PO CAPS: 50000.0000 [IU] | ORAL_CAPSULE | ORAL | 1 refills | Status: DC

## 2022-09-28 ENCOUNTER — Encounter: Payer: Self-pay | Admitting: Neurology

## 2022-09-28 ENCOUNTER — Other Ambulatory Visit (INDEPENDENT_AMBULATORY_CARE_PROVIDER_SITE_OTHER): Payer: 59

## 2022-09-28 ENCOUNTER — Ambulatory Visit: Payer: 59 | Admitting: Neurology

## 2022-09-28 VITALS — BP 121/63 | HR 72 | Ht 66.0 in | Wt 170.0 lb

## 2022-09-28 DIAGNOSIS — H9313 Tinnitus, bilateral: Secondary | ICD-10-CM | POA: Diagnosis not present

## 2022-09-28 DIAGNOSIS — E559 Vitamin D deficiency, unspecified: Secondary | ICD-10-CM

## 2022-09-28 DIAGNOSIS — G4733 Obstructive sleep apnea (adult) (pediatric): Secondary | ICD-10-CM

## 2022-09-28 DIAGNOSIS — G43E09 Chronic migraine with aura, not intractable, without status migrainosus: Secondary | ICD-10-CM

## 2022-09-28 DIAGNOSIS — R252 Cramp and spasm: Secondary | ICD-10-CM

## 2022-09-28 LAB — VITAMIN B12: Vitamin B-12: 241 pg/mL (ref 211–911)

## 2022-09-28 MED ORDER — TOPIRAMATE 25 MG PO TABS: 25.0000 mg | ORAL_TABLET | Freq: Every day | ORAL | 5 refills | Status: DC

## 2022-09-28 NOTE — Patient Instructions (Signed)
I want to get some blood work today. I will also get a CT of your head to make sure there is nothing of concern given the recent increase in your headaches. I will be in touch when I have the results.  I do recommend you discuss CPAP with your sleep doctor as untreated sleep apnea is a risk factor for many health problems including heart attack, stroke, and increase in headaches.  You should talk to your doctors about a cholesterol medication. I do not think atorvastatin is contributing to your headaches, but you should find a medication that works for you.  Start Topamax 25 mg daily to help prevent headaches.  Contact us in 4 weeks with update and we can increase dose if needed. Take Sumatriptan at earliest onset of headache.  May repeat dose once in 2 hours if needed.  Maximum 2 tablets in 24 hours. Limit use of pain relievers to no more than 2 days out of the week.  These medications include acetaminophen, NSAIDs (ibuprofen/Advil/Motrin, naproxen/Aleve, triptans (Imitrex/sumatriptan), Excedrin, and narcotics.  This will help reduce risk of rebound headaches. Be aware of common food triggers:  - Caffeine:  coffee, black tea, cola, Mt. Dew  - Chocolate  - Dairy:  aged cheeses (brie, blue, cheddar, gouda, [place], provolone, [place], Swiss, etc), chocolate milk, buttermilk, sour cream, limit eggs and yogurt  - Nuts, peanut butter  - Alcohol  - Cereals/grains:  FRESH breads (fresh bagels, sourdough, doughnuts), yeast productions  - Processed/canned/aged/cured meats (pre-packaged deli meats, hotdogs)  - MSG/glutamate:  soy sauce, flavor enhancer, pickled/preserved/marinated foods  - Sweeteners:  aspartame (Equal, Nutrasweet).  Sugar and Splenda are okay  - Vegetables:  legumes (lima beans, lentils, snow peas, fava beans, pinto peans, peas, garbanzo beans), sauerkraut, onions, olives, pickles  - Fruit:  avocados, bananas, citrus fruit (orange, lemon, grapefruit), mango  - Other:  Frozen meals,  macaroni and cheese Routine exercise Stay adequately hydrated (aim for 64 oz water daily) Keep headache diary Maintain proper stress management Maintain proper sleep hygiene Do not skip meals Consider supplements:  magnesium citrate 400mg  daily, riboflavin 400mg  daily, coenzyme Q10 100mg  three times daily.   Recommend the following measures that may provide some symptomatic benefit for muscle twitching and/or cramps: - Adequate oral clear fluid intake to maintain optimal hydration (about 2.5 liters, or around 8-10 glasses per day) Avoidance of caffeine Trial of DIET tonic water: About 1 glass, up to 6 times daily Magnesium oxide up to 400 mg by mouth twice daily, as needed (over the counter) Gentle muscle stretching routine, especially before bedtime   The physicians and staff at [facility] are committed to providing excellent care. You may receive a survey requesting feedback about your experience at our office. We strive to receive "very good" responses to the survey questions. If you feel that your experience would prevent you from giving the office a "very good " response, please contact our office to try to remedy the situation. We may be reached at [phone]. Thank you for taking the time out of your busy day to complete the survey.  Jacquelyne Balint, MD [facility]

## 2022-10-05 NOTE — Telephone Encounter (Signed)
Routing My Chart message to Jordan Cantrell as FYI.  Jordan Cantrell "Jordan Cantrell"  P [facility] (supporting [name], NP)7 days ago    Saw neurologist today, will start cpap back tonite, thanks  Nothing further needed

## 2022-10-15 LAB — ALPHA-GAL PANEL
Allergen, Mutton, f88: 1.96 kU/L — ABNORMAL HIGH
Allergen, Pork, f26: 1.17 kU/L — ABNORMAL HIGH
Beef: 2.04 kU/L — ABNORMAL HIGH
CLASS: 2
CLASS: 2
Class: 2
GALACTOSE-ALPHA-1,3-GALACTOSE IGE*: 20 kU/L — ABNORMAL HIGH (ref <0.10)

## 2022-10-15 LAB — INTERPRETATION:

## 2022-10-16 ENCOUNTER — Encounter: Payer: Self-pay | Admitting: Family Medicine

## 2022-10-17 ENCOUNTER — Encounter: Payer: Self-pay | Admitting: Family Medicine

## 2022-10-24 ENCOUNTER — Ambulatory Visit: Payer: 59 | Attending: Cardiovascular Disease

## 2022-10-24 DIAGNOSIS — I10 Essential (primary) hypertension: Secondary | ICD-10-CM

## 2022-10-24 DIAGNOSIS — R002 Palpitations: Secondary | ICD-10-CM

## 2022-10-24 DIAGNOSIS — E785 Hyperlipidemia, unspecified: Secondary | ICD-10-CM

## 2022-10-25 LAB — LIPID PANEL
Chol/HDL Ratio: 2.1 ratio (ref 0.0–5.0)
Cholesterol, Total: 105 mg/dL (ref 100–199)
HDL: 49 mg/dL (ref >39)
LDL Chol Calc (NIH): 41 mg/dL (ref 0–99)
Triglycerides: 68 mg/dL (ref 0–149)
VLDL Cholesterol Cal: 15 mg/dL (ref 5–40)

## 2022-10-25 LAB — ALT: ALT: 44 IU/L (ref 0–44)

## 2022-10-26 ENCOUNTER — Encounter: Payer: Self-pay | Admitting: Family Medicine

## 2022-11-02 ENCOUNTER — Other Ambulatory Visit: Payer: 59

## 2022-11-06 ENCOUNTER — Ambulatory Visit
Admission: RE | Admit: 2022-11-06 | Discharge: 2022-11-06 | Disposition: A | Discharge status: Home / Self Care | Payer: 59 | Source: Ambulatory Visit | Attending: Neurology | Admitting: Neurology

## 2022-11-06 DIAGNOSIS — R252 Cramp and spasm: Secondary | ICD-10-CM

## 2022-11-06 DIAGNOSIS — E559 Vitamin D deficiency, unspecified: Secondary | ICD-10-CM

## 2022-11-06 DIAGNOSIS — H9313 Tinnitus, bilateral: Secondary | ICD-10-CM

## 2022-11-06 DIAGNOSIS — G4733 Obstructive sleep apnea (adult) (pediatric): Secondary | ICD-10-CM

## 2022-11-06 DIAGNOSIS — G43E09 Chronic migraine with aura, not intractable, without status migrainosus: Secondary | ICD-10-CM

## 2022-11-07 ENCOUNTER — Telehealth: Payer: Self-pay | Admitting: Neurology

## 2022-11-07 NOTE — Telephone Encounter (Signed)
See result note. Patient provided Imaging results and recommendations.

## 2022-11-07 NOTE — Telephone Encounter (Signed)
Pt is returning a call to [facility]

## 2022-11-08 ENCOUNTER — Other Ambulatory Visit: Payer: Self-pay | Admitting: Family Medicine

## 2022-11-14 ENCOUNTER — Other Ambulatory Visit: Payer: Self-pay | Admitting: Family Medicine

## 2022-12-07 ENCOUNTER — Ambulatory Visit: Payer: 59 | Admitting: Primary Care

## 2022-12-07 NOTE — Progress Notes (Signed)
NEUROLOGY FOLLOW UP OFFICE NOTE  Jordan Cantrell [id number]  Subjective:  Jordan Cantrell is a 62 y.o. year old right-handed male with a medical history of severe OSA, HTN, HLD, insomnia, OA, former smoker who we last saw on 09/28/22.  To briefly review: Patient has migraines since at least the age of 3. The pain was previously so bad that he would seek treatment in the ED. Things improved over the last 1-2 decades though. The pain is on the right side of his face starting behind the eye. It is a soreness feeling. The pain is rated 2-3/10. He endorses photophobia, phonophobia, nausea, but no vomiting. When he gets a headache, he will go home and rest in a dark room with no sound. The symptoms will decrease and resolve in 30-45 minutes. He has 2-3 headaches per week. He mentions he may not ever be completely symptom free.    He endorses aura that prevents function. Previously this was tingling in his hands. Currently it is a colorful lightening bolt, semicircular images, and blurry vision in the peripheral vision. He will take sumatriptan if his aura will not go away, but not with every headache.   Headaches do not wake him up from sleep. He does not have them when first waking, until he started CPAP.   Lately he feels the frequency of his migraines have increased (over the last few weeks). It may have increased with recent start of CPAP. As a result, he has stopped CPAP. Patient was also started on atorvastatin but stopped this a couple of weeks ago due to this being a new medication and wondering if this was contributing. He was previously taking sudafed every day due to congestion. He is now doing netty pot every day.   Patient has changed his diet significantly due to some CAD. He has not seen a difference in his headaches since change in diet which was a few months ago.   He feels like bright lights are a trigger. He denies worsening with position (laying or standing) or with straining.  He may have some tearing in the left eye, but no running nose, ptosis, facial pallor/flushing/sweating, or aural fullness.   Family history of headaches: No   Social history: Caffiene use: 1-2 cups of coffee every morning Tobacco use: dip tobacco EtOH use: 1-2 mixed drinks per day Drug use: No Sleep: Unclear - patient has difficulty assessing Mood: Generally okay, but can be down when he has a lot of migraines   Current headache medications (including over the counter): Sumatriptan - gets 6 per month, usually does not use them all - generally helps with headaches/aura Diclofenac for OA (1 every day)   Metoprolol 100 mg daily (started for migraine prevention decades ago, but also for HTN) Magnesium 420 mg - just started this week   Previous medications tried (effectiveness, side effects, why stopped):  None    At the end of the appointment, patient also pulled out a list of other complaints including leg twitches/cramps, and tinnitus. We did not fully discuss today.  Most recent Assessment and Plan (09/28/22): Jordan Cantrell is a 62 y.o. male who presents for evaluation of headaches. He has a relevant medical history of severe OSA, HTN, HLD, insomnia, OA, former smoker. His neurological examination is essentially normal today. Patient's symptoms sound consistent with long history of chronic migraine with aura. He is currently getting 2-3 headaches per week, which is an increase from prior. The intensity of the headaches  are bad per patient. He has been on metoprolol which does not seem to be helping prevent migraine. Sumatriptan is affective at headache abortion per patient.   PLAN: -Blood work: B12 -CT head wo contrast due change in headache frequency -Discussed OSA and the importance of CPAP - patient will discuss with sleep doctor -Discussed atorvastatin. I do not think this is contributing to his migraines and certainly recommend some cholesterol control. Patient to discuss with  PCP. -See ophtho for dilated fundoscopy to ensure no papilledema given that I could not fully evaluate on non-dilated exam -Vit D 1000 IU daily -For migraines: Migraine prevention:  Topamax 25 mg daily - may have to increase pending response. Continue metoprolol 100 mg daily, but likely for cardiac reasons as it does not seem to be helping headache. Migraine rescue:  Sumatriptan 50 mg PRN at headache onset. Limit use of pain relievers to no more than 2 days out of week to prevent risk of rebound or medication-overuse headache. Keep headache diary  Since their last visit: B12 was borderline low. I recommended supplementing with B12 1000 mcg daily. Patient starting taking a B supplement (not sure which), but did not like how it agreed with him. He will look into this again.  CT head showed no significant brain abnormalities. Small fluid level in the right maxillary sinus and mucosal thickening in the sinuses were noted.  Patient is taking Topamax 25 mg daily. He has not had any headaches since starting the medication. He has not had to use Sumatriptan.  Patient has not yet seen ophtho, but is scheduled in the next couple of months.  At last visit, patient mentioned leg twitching/cramps. The twitching has improved. Every once in a while, he will get woken up with cramps. He denies any numbness or tingling in legs.  Patient also has chronic tinnitus. It seems to be getting worse over time. He used to work at [employer] and has been told he has hearing loss in the past.   MEDICATIONS:  Outpatient Encounter Medications as of 12/14/2022  Medication Sig   amLODipine (NORVASC) 10 MG tablet TAKE 1 TABLET BY MOUTH EVERY DAY   atorvastatin (LIPITOR) 40 MG tablet Take 1 tablet (40 mg total) by mouth every evening.   betamethasone dipropionate 0.05 % cream PRN   diclofenac (VOLTAREN) 75 MG EC tablet DAILY   hydrocortisone 2.5 % cream PLEASE SEE ATTACHED FOR DETAILED DIRECTIONS   metoprolol succinate  (TOPROL-XL) 50 MG 24 hr tablet TAKE 2 TABLETS BY MOUTH EVERY DAY   metroNIDAZOLE (METROGEL) 0.75 % gel Apply topically daily. PRN   Multiple Vitamin (MULTI VITAMIN PO) Take by mouth.   pramoxine-hydrocortisone (ANALPRAM HC) cream Apply topically in the morning and at bedtime. (Patient taking differently: Apply topically in the morning and at bedtime. PRN)   SUMAtriptan (IMITREX) 50 MG tablet TAKE 1 TABLET BY MOUTH AS NEEDED MIGRAINE   tadalafil (CIALIS) 10 MG tablet Take 1 tablet (10 mg total) by mouth every 3 (three) days as needed for erectile dysfunction.   triamcinolone cream (KENALOG) 0.5 % Apply 1 application topically 2 (two) times daily. For 10 days to affected area (Patient taking differently: Apply 1 application  topically 2 (two) times daily. PRN)   Vitamin D, Ergocalciferol, (DRISDOL) 1.25 MG (50000 UNIT) CAPS capsule Take 1 capsule (50,000 Units total) by mouth every 7 (seven) days.   [DISCONTINUED] topiramate (TOPAMAX) 25 MG tablet Take 1 tablet (25 mg total) by mouth daily.   diphenhydrAMINE (BENADRYL) 25  MG tablet Take 25 mg by mouth every 6 (six) hours as needed for itching. (Patient not taking: Reported on 09/25/2022)   topiramate (TOPAMAX) 25 MG tablet Take 1 tablet (25 mg total) by mouth daily.   No facility-administered encounter medications on file as of 12/14/2022.    PAST MEDICAL HISTORY: Past Medical History:  Diagnosis Date   Allergic rhinitis, cause unspecified    to tick bite - resolved   Arthritis    knees, hands   Balanoposthitis    Mild   Chest pain, unspecified    2007- stress test normal   Constipation 09/24/2009   Prn metamucil  - occasional   Dry eye syndrome    GERD (gastroesophageal reflux disease)    Hearing loss    slight - bilateral - no hearing aids   HEMORRHOID, EXTERNAL, THROMBOSED 05/06/2010   Qualifier: Diagnosis of  By: Yetta Barre MD, Bernadene Bell.    History of shingles    Hypertension    Migraine, unspecified, without mention of intractable  migraine without mention of status migrainosus    Severe sleep apnea 07/07/2022   TINNITUS NOS 01/31/2008   Qualifier: Diagnosis of  By: Posey Rea MD, Georgina Quint     PAST SURGICAL HISTORY: Past Surgical History:  Procedure Laterality Date   COLONOSCOPY  05/16/2011    hx polyps    FOOT FRACTURE SURGERY  05/16/1991   right-crushed all 4 toes reportedly   KNEE ARTHROSCOPY  05/15/2009   Right for Condromalacia   ORIF FEMUR FRACTURE  05/16/1991   Right   POLYPECTOMY     WISDOM TOOTH EXTRACTION      ALLERGIES: Allergies  Allergen Reactions   Codeine Nausea Only    FAMILY HISTORY: Family History  Problem Relation Age of Onset   Diabetes type II Mother    Heart disease Mother        CABG-died during this, smoker   Hyperlipidemia Mother    Hypertension Mother    Diabetes Father    Heart disease Father        thought death due to this, smoker   Hypertension Father    Schizophrenia Father    Colon polyps Sister    Colon cancer Neg Hx    Stomach cancer Neg Hx    Esophageal cancer Neg Hx    Rectal cancer Neg Hx    Crohn's disease Neg Hx     SOCIAL HISTORY: Social History   Tobacco Use   Smoking status: Former    Current packs/day: 0.00    Types: Cigarettes    Quit date: 04/28/2014    Years since quitting: 8.6    Passive exposure: Past ("PARENTS SMOKED")   Smokeless tobacco: Current    Types: Snuff   Tobacco comments:    Reported Dipping at this time- 09/28/22  Vaping Use   Vaping status: Never Used  Substance Use Topics   Alcohol use: Yes    Alcohol/week: 10.0 - 12.0 standard drinks of alcohol    Types: 10 - 12 Cans of beer per week    Comment: beer/liquor   Drug use: No   Social History   Social History Narrative   Married-'81. 1 dtr - '87, 1 son - '83, 2 granddaughters   Marriage in good health.       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       Hobbies: time with grandkids, work with cows, minimal travel due to pain   Are you right handed or left handed? right   Are you currently employed ?    What is your current occupation?   Do you live at home alone?no   Who lives with you? wife   What type of home do you live in: 1 story or 2 story? two    Caffeine 1 cup daily      Objective:  Vital Signs:  BP 117/76   Pulse 63   Ht 5\' 6"  (1.676 m)   Wt 167 lb (75.8 kg)   SpO2 96%   BMI 26.95 kg/m   General: No acute distress.  Patient appears well-groomed.   Head:  Normocephalic/atraumatic Eyes:  Fundi examined, no obvious papilledema Neck: supple, full range of motion Lungs:  Non-labored breathing on room air  Neurological Exam: alert and oriented.  Speech fluent and not dysarthric, language intact.  CN II-XII intact. Bulk and tone normal, muscle strength 5/5 throughout.  Sensation to light touch intact. Gait normal.  Labs and Imaging review: New results: B12 (09/28/22): 241 Lipid panel (10/24/22): wnl  CT head wo contrast (11/06/22): FINDINGS: Brain: No evidence of acute infarction, hemorrhage, hydrocephalus, extra-axial collection or mass lesion/mass effect.   Vascular: No hyperdense vessels.  No unexpected calcification   Skull: Normal. Negative for fracture or focal lesion.   Sinuses/Orbits: Small fluid level right maxillary sinus. Mucosal thickening in the sinuses   Other: None   IMPRESSION: 1. Negative non contrasted CT appearance of the brain 2. Maxillary sinusitis  Previously reviewed results: Lab Results  Component Value Date    HGBA1C 5.7 09/25/2022           Lab Results  Component Value Date    TSH 1.63 09/15/2021      CBC (03/17/22) unremarkable CMP (03/17/22) unremarkable  Assessment/Plan:  This is Jordan Cantrell, a 62 y.o. male with: Migraine with aura - Previously was having ~3 headaches per week, but currently having none since  starting Topamax. Patient was prescribed metoprolol 100 mg daily for migraines in the past (also for cardiac reasons), but this did not help with headaches B12 deficiency Tinnitus - likely secondary to hearing loss Muscle cramps - occasional, at night. No associated numbness, tingling, or weakness of legs  Plan: -B12 1000 mcg daily or talk to PCP if he prefers injection -Gave OTC recommendations for cramps -Discussed audiology referral, but patient will defer for now -For migraines: Migraine prevention:  Continue Topamax 25 mg daily Migraine rescue:  Continue Sumatriptan 50 mg PRN at headache onset, may repeat dose after 2 hours if needed, but only once Limit use of pain relievers to no more than 2 days out of week to prevent risk of rebound or medication-overuse headache. Keep headache diary  Return to clinic in 6 months   Jacquelyne Balint, MD

## 2022-12-14 ENCOUNTER — Ambulatory Visit: Payer: 59 | Admitting: Neurology

## 2022-12-14 ENCOUNTER — Encounter: Payer: Self-pay | Admitting: Neurology

## 2022-12-14 VITALS — BP 117/76 | HR 63 | Ht 66.0 in | Wt 167.0 lb

## 2022-12-14 DIAGNOSIS — H9313 Tinnitus, bilateral: Secondary | ICD-10-CM | POA: Diagnosis not present

## 2022-12-14 DIAGNOSIS — R252 Cramp and spasm: Secondary | ICD-10-CM

## 2022-12-14 DIAGNOSIS — G43E09 Chronic migraine with aura, not intractable, without status migrainosus: Secondary | ICD-10-CM | POA: Diagnosis not present

## 2022-12-14 DIAGNOSIS — G4733 Obstructive sleep apnea (adult) (pediatric): Secondary | ICD-10-CM | POA: Diagnosis not present

## 2022-12-14 DIAGNOSIS — E559 Vitamin D deficiency, unspecified: Secondary | ICD-10-CM

## 2022-12-14 DIAGNOSIS — E538 Deficiency of other specified B group vitamins: Secondary | ICD-10-CM

## 2022-12-14 MED ORDER — TOPIRAMATE 25 MG PO TABS: 25.0000 mg | ORAL_TABLET | Freq: Every day | ORAL | 5 refills | Status: DC

## 2022-12-14 NOTE — Patient Instructions (Addendum)
-  For migraines: Migraine prevention:  Continue Topamax 25 mg daily Migraine rescue:  Continue Sumatriptan 50 mg as needed at headache onset, may repeat dose after 2 hours if needed, but only once Limit use of pain relievers to no more than 2 days out of week to prevent risk of rebound or medication-overuse headache. Keep headache diary  Please have your eye doctor send me their notes when you see them.  Your B12 was low. Given your symptoms, I would recommend supplementing with B12 1000 mcg daily. This can be bought over the counter at any local drug store or online.   - Recommend the following measures that may provide some symptomatic benefit for muscle twitching and/or cramps: - Adequate oral clear fluid intake to maintain optimal hydration (about 2.5 liters, or around 8-10 glasses per day) Avoidance of caffeine Trial of DIET tonic water: About 1 glass, up to 6 times daily Magnesium oxide up to 400 mg by mouth twice daily, as needed (over the counter) Gentle muscle stretching routine, especially before bedtime  I will see you back in 6 months or sooner if needed.  The physicians and staff at [facility] are committed to providing excellent care. You may receive a survey requesting feedback about your experience at our office. We strive to receive "very good" responses to the survey questions. If you feel that your experience would prevent you from giving the office a "very good " response, please contact our office to try to remedy the situation. We may be reached at [phone]. Thank you for taking the time out of your busy day to complete the survey.  Jacquelyne Balint, MD [facility]

## 2023-01-04 ENCOUNTER — Ambulatory Visit: Payer: 59 | Admitting: Primary Care

## 2023-01-04 ENCOUNTER — Encounter: Payer: Self-pay | Admitting: Primary Care

## 2023-01-04 VITALS — BP 140/78 | HR 72 | Temp 98.5°F | Ht 66.0 in | Wt 166.2 lb

## 2023-01-04 DIAGNOSIS — G4733 Obstructive sleep apnea (adult) (pediatric): Secondary | ICD-10-CM

## 2023-01-04 DIAGNOSIS — G4730 Sleep apnea, unspecified: Secondary | ICD-10-CM | POA: Diagnosis not present

## 2023-01-04 NOTE — Assessment & Plan Note (Addendum)
-  Patient has trouble falling and staying asleep. He has never slept well. He does not feel well rested. He has tried melatonin and temazepam. He is hesitant to take prescription sleep aid. He will re-try melatonin 5-10mg  at bedtime. If sleep quality does not improve with melatonin or after CPAP titration study recommend trial of either Lunesta, Doxepin or Rozerem.

## 2023-01-04 NOTE — Patient Instructions (Addendum)
Recommendations: - Take melatonin 5mg  at bedtime; in 3-5 days if tolerating can increase to 10mg  - Get CPAP titration study in lab - If still having difficulties sleeping recommend trying prescription sleep aid to help with sleep induction/maintenance such as Lunesta, Doxepin or Ramelteon   Follow-up: - 3-4 months with Waynetta Sandy NP or sooner if needed   Insomnia Insomnia is a sleep disorder that makes it difficult to fall asleep or stay asleep. Insomnia can cause fatigue, low energy, difficulty concentrating, mood swings, and poor performance at work or school. There are three different ways to classify insomnia: Difficulty falling asleep. Difficulty staying asleep. Waking up too early in the morning. Any type of insomnia can be long-term (chronic) or short-term (acute). Both are common. Short-term insomnia usually lasts for 3 months or less. Chronic insomnia occurs at least three times a week for longer than 3 months. What are the causes? Insomnia may be caused by another condition, situation, or substance, such as: Having certain mental health conditions, such as anxiety and depression. Using caffeine, alcohol, tobacco, or drugs. Having gastrointestinal conditions, such as gastroesophageal reflux disease (GERD). Having certain medical conditions. These include: Asthma. Alzheimer's disease. Stroke. Chronic pain. An overactive thyroid gland (hyperthyroidism). Other sleep disorders, such as restless legs syndrome and sleep apnea. Menopause. Sometimes, the cause of insomnia may not be known. What increases the risk? Risk factors for insomnia include: Gender. Females are affected more often than males. Age. Insomnia is more common as people get older. Stress and certain medical and mental health conditions. Lack of exercise. Having an irregular work schedule. This may include working night shifts and traveling between different time zones. What are the signs or symptoms? If you have  insomnia, the main symptom is having trouble falling asleep or having trouble staying asleep. This may lead to other symptoms, such as: Feeling tired or having low energy. Feeling nervous about going to sleep. Not feeling rested in the morning. Having trouble concentrating. Feeling irritable, anxious, or depressed. How is this diagnosed? This condition may be diagnosed based on: Your symptoms and medical history. Your health care provider may ask about: Your sleep habits. Any medical conditions you have. Your mental health. A physical exam. How is this treated? Treatment for insomnia depends on the cause. Treatment may focus on treating an underlying condition that is causing the insomnia. Treatment may also include: Medicines to help you sleep. Counseling or therapy. Lifestyle adjustments to help you sleep better. Follow these instructions at home: Eating and drinking  Limit or avoid alcohol, caffeinated beverages, and products that contain nicotine and tobacco, especially close to bedtime. These can disrupt your sleep. Do not eat a large meal or eat spicy foods right before bedtime. This can lead to digestive discomfort that can make it hard for you to sleep. Sleep habits  Keep a sleep diary to help you and your health care provider figure out what could be causing your insomnia. Write down: When you sleep. When you wake up during the night. How well you sleep and how rested you feel the next day. Any side effects of medicines you are taking. What you eat and drink. Make your bedroom a dark, comfortable place where it is easy to fall asleep. Put up shades or blackout curtains to block light from outside. Use a white noise machine to block noise. Keep the temperature cool. Limit screen use before bedtime. This includes: Not watching TV. Not using your smartphone, tablet, or computer. Stick to a routine that  includes going to bed and waking up at the same times every day and  night. This can help you fall asleep faster. Consider making a quiet activity, such as reading, part of your nighttime routine. Try to avoid taking naps during the day so that you sleep better at night. Get out of bed if you are still awake after 15 minutes of trying to sleep. Keep the lights down, but try reading or doing a quiet activity. When you feel sleepy, go back to bed. General instructions Take over-the-counter and prescription medicines only as told by your health care provider. Exercise regularly as told by your health care provider. However, avoid exercising in the hours right before bedtime. Use relaxation techniques to manage stress. Ask your health care provider to suggest some techniques that may work well for you. These may include: Breathing exercises. Routines to release muscle tension. Visualizing peaceful scenes. Make sure that you drive carefully. Do not drive if you feel very sleepy. Keep all follow-up visits. This is important. Contact a health care provider if: You are tired throughout the day. You have trouble in your daily routine due to sleepiness. You continue to have sleep problems, or your sleep problems get worse. Get help right away if: You have thoughts about hurting yourself or someone else. Get help right away if you feel like you may hurt yourself or others, or have thoughts about taking your own life. Go to your nearest emergency room or: Call 911. Call the National Suicide Prevention Lifeline at 6718878791 or 988. This is open 24 hours a day. Text the Crisis Text Line at 814-785-8685. Summary Insomnia is a sleep disorder that makes it difficult to fall asleep or stay asleep. Insomnia can be long-term (chronic) or short-term (acute). Treatment for insomnia depends on the cause. Treatment may focus on treating an underlying condition that is causing the insomnia. Keep a sleep diary to help you and your health care provider figure out what could be causing  your insomnia. This information is not intended to replace advice given to you by your health care provider. Make sure you discuss any questions you have with your health care provider. Document Revised: 04/11/2021 Document Reviewed: 04/11/2021 Elsevier Patient Education  2024 [employer].

## 2023-01-04 NOTE — Progress Notes (Signed)
@Patient  ID: Jordan Cantrell, male    DOB: 1961-03-16, 62 y.o.   MRN: [id number]  Chief Complaint  Patient presents with   Follow-up    Wearing CPAP-not going well, not sleeping well with    Referring provider: Shelva Majestic, MD  HPI: 62 year old male, former light smoker quit in 2015 (uses snuff).  Past medical history significant for hypertension, allergic rhinitis, osteoarthritis, hyperlipidemia, insomnia, urinary frequency, tobacco abuse.  Previous [facility] pulmonary encounter:  04/05/2022  Patient presents today for sleep consult. Patient has symptoms of insomnia and disrupted sleep.  His wife has told him that he snores.  He has a history of sleep apnea but never been on CPAP.  Sleep study was greater than 62 years old, results are not in chart for review.  He works as a Aeronautical engineer. He has worked third shift his whole career, typical hours were form 11pm-8am. He has taken sleep aids in the past. Tried melatonin and temazepam.  He takes over the counter medication called deep sleep 1 hour prior to bedtime which seems to help maintain sleep better.  Typical bedtime is between 9 and 10 PM.  It takes him on average 10 to 30 minutes to fall asleep.  He wakes up 3-5 times a night.  He will start his day between 430 and 5:30 AM.  He gets woken up in the morning by leg camps and also describes burning sensation in his feet at night.  He falls asleep easily when inactive more so in the afternoon.  Epworth score 13.  Denies symptoms of narcolepsy, cataplexy or sleepwalking.  Sleep questionnaire Symptoms-  Waking up at night  Prior sleep study- > 10 years  Bedtime- 9-10pm  Time to fall asleep- 10-54mins  Nocturnal awakenings- 3-5 times  Out of bed/start of day- 4:30-5:30am  Weight changes- stable  Do you operate heavy machinery- no  Do you currently wear CPAP- no Do you current wear oxygen- no Epworth- 13   07/07/2022 Patient presents today to review home sleep study.  Patient  had HST on 06/26/2022 that showed evidence of severe obstructive sleep apnea, AHI 36.1 an hour with SpO2 low 85% (average 93%).  We discussed risks of untreated sleep apnea and treatment options.  Patient is hesitant to start CPAP but willing to try.  He is unsure he is can be able to tolerate having something on his face at night while sleeping. He also reports having symptoms of tinnitus, we discussed seeing an ear nose and throat doctor to evaluate.   09/06/2022 Patient presents today for 62 follow-up. for OSA follow-up. HST on 06/26/2022 showed evidence of severe obstructive sleep apnea, AHI 36.1 an hour with SpO2 low 85% (average 93%). He has not seen much difference in his sleep quality. He changed to nasal pillow mask which has helped a lot. Recently started getting water condensation in his mask. He goes to bed around 9:30pm. He wakes up the same amount of times. Blood pressure has improved and he has lost 8-10 lbs. He is having a lot more migraines. He is keeping a food diary and has an apt with neurology.   Airview download 08/06/22-09/04/22 Usage days 28/30 days (93%); 21 days (70%) > 4 hours Average usage 4 hours 16 mins Pressure 5-15cm h20 (10.4cm h20- 95%) Airleaks 17L/min (95%) AHI 9.5    01/04/2023- interim hx  Patient presents today for OSA follow-up. He continues to struggle with his sleep.  He has trouble falling and staying asleep. He is compliant  with CPAP. He has not seen benefit from wearing CPAP yet. Feels he sleeps better without CPAP. He is not anxious wearing CPAP, he looks forward to putting his mask on and hopes to get a good night sleep. He has no issue with mask fit, feels pressure is still too high. He is having large amount of airleaks, He will get congested at night which makes it difficult for him to wear his CPAP at times. He uses a netti pot at bedtime. He has tried melatonin and an herbal deep sleep medication, he is hesitant to take medication.    Airview download  12/04/22-01/02/23 Usage 30 days (100%) : 19 days (63%) > 4 hours Average usage days used 4 hours 26 mins Pressure 8cm h20 Airleaks 38.2L/min (95%) AHI 5.4   Allergies  Allergen Reactions   Codeine Nausea Only    Immunization History  Administered Date(s) Administered   Influenza,inj,Quad PF,6+ Mos 02/17/2016, 03/21/2017, 03/01/2018, 03/19/2019, 03/04/2020   Influenza,inj,quad, With Preservative 02/28/2018   Influenza-Unspecified 03/29/2014, 02/28/2018   Tdap 11/12/2013    Past Medical History:  Diagnosis Date   Allergic rhinitis, cause unspecified    to tick bite - resolved   Arthritis    knees, hands   Balanoposthitis    Mild   Chest pain, unspecified    2007- stress test normal   Constipation 09/24/2009   Prn metamucil  - occasional   Dry eye syndrome    GERD (gastroesophageal reflux disease)    Hearing loss    slight - bilateral - no hearing aids   HEMORRHOID, EXTERNAL, THROMBOSED 05/06/2010   Qualifier: Diagnosis of  By: Yetta Barre MD, Bernadene Bell.    History of shingles    Hypertension    Migraine, unspecified, without mention of intractable migraine without mention of status migrainosus    Severe sleep apnea 07/07/2022   TINNITUS NOS 01/31/2008   Qualifier: Diagnosis of  By: Plotnikov MD, Georgina Quint     Tobacco History: Social History   Tobacco Use  Smoking Status Former   Current packs/day: 0.00   Types: Cigarettes   Quit date: 04/28/2014   Years since quitting: 8.6   Passive exposure: Past ("PARENTS SMOKED")  Smokeless Tobacco Current   Types: Snuff  Tobacco Comments   Reported Dipping at this time- 09/28/22   Ready to quit: Not Answered Counseling given: Not Answered Tobacco comments: Reported Dipping at this time- 09/28/22   Outpatient Medications Prior to Visit  Medication Sig Dispense Refill   amLODipine (NORVASC) 10 MG tablet TAKE 1 TABLET BY MOUTH EVERY DAY 90 tablet 3   atorvastatin (LIPITOR) 40 MG tablet Take 1 tablet (40 mg total) by mouth  every evening. 30 tablet 11   betamethasone dipropionate 0.05 % cream PRN     diclofenac (VOLTAREN) 75 MG EC tablet DAILY     diphenhydrAMINE (BENADRYL) 25 MG tablet Take 25 mg by mouth every 6 (six) hours as needed for itching.     hydrocortisone 2.5 % cream PLEASE SEE ATTACHED FOR DETAILED DIRECTIONS     metoprolol succinate (TOPROL-XL) 50 MG 24 hr tablet TAKE 2 TABLETS BY MOUTH EVERY DAY 180 tablet 3   metroNIDAZOLE (METROGEL) 0.75 % gel Apply topically daily. PRN     Multiple Vitamin (MULTI VITAMIN PO) Take by mouth.     pramoxine-hydrocortisone (ANALPRAM HC) cream Apply topically in the morning and at bedtime. (Patient taking differently: Apply topically in the morning and at bedtime. PRN) 57 g 3   SUMAtriptan (IMITREX) 50 MG  tablet TAKE 1 TABLET BY MOUTH AS NEEDED MIGRAINE 6 tablet 10   tadalafil (CIALIS) 10 MG tablet Take 1 tablet (10 mg total) by mouth every 3 (three) days as needed for erectile dysfunction. 10 tablet 3   topiramate (TOPAMAX) 25 MG tablet Take 1 tablet (25 mg total) by mouth daily. 30 tablet 5   triamcinolone cream (KENALOG) 0.5 % Apply 1 application topically 2 (two) times daily. For 10 days to affected area (Patient taking differently: Apply 1 application  topically 2 (two) times daily. PRN) 30 g 0   Vitamin D, Ergocalciferol, (DRISDOL) 1.25 MG (50000 UNIT) CAPS capsule Take 1 capsule (50,000 Units total) by mouth every 7 (seven) days. 13 capsule 1   No facility-administered medications prior to visit.    Review of Systems  Review of Systems  Constitutional:  Positive for fatigue.  HENT: Negative.    Respiratory: Negative.    Cardiovascular: Negative.   Psychiatric/Behavioral:  Positive for sleep disturbance.    Physical Exam  BP (!) 140/78 (BP Location: Left Arm, Cuff Size: Normal)   Pulse 72   Temp 98.5 F (36.9 C) (Temporal)   Ht 5\' 6"  (1.676 m)   Wt 166 lb 3.2 oz (75.4 kg)   SpO2 96%   BMI 26.83 kg/m  Physical Exam Constitutional:       Appearance: Normal appearance.  HENT:     Head: Normocephalic and atraumatic.     Mouth/Throat:     Mouth: Mucous membranes are moist.     Pharynx: Oropharynx is clear.  Cardiovascular:     Rate and Rhythm: Normal rate and regular rhythm.  Pulmonary:     Effort: Pulmonary effort is normal.     Breath sounds: Normal breath sounds. No wheezing or rhonchi.  Skin:    General: Skin is warm and dry.  Neurological:     General: No focal deficit present.     Mental Status: He is alert and oriented to person, place, and time. Mental status is at baseline.  Psychiatric:        Mood and Affect: Mood normal.        Behavior: Behavior normal.        Thought Content: Thought content normal.        Judgment: Judgment normal.      Lab Results:  CBC    Component Value Date/Time   WBC 5.3 09/25/2022 0957   RBC 5.26 09/25/2022 0957   HGB 17.2 (H) 09/25/2022 0957   HCT 49.6 09/25/2022 0957   PLT 175.0 09/25/2022 0957   MCV 94.3 09/25/2022 0957   MCHC 34.7 09/25/2022 0957   RDW 12.4 09/25/2022 0957   LYMPHSABS 1.4 09/25/2022 0957   MONOABS 0.6 09/25/2022 0957   EOSABS 0.1 09/25/2022 0957   BASOSABS 0.0 09/25/2022 0957    BMET    Component Value Date/Time   NA 141 09/25/2022 0957   K 4.0 09/25/2022 0957   CL 103 09/25/2022 0957   CO2 28 09/25/2022 0957   GLUCOSE 105 (H) 09/25/2022 0957   BUN 11 09/25/2022 0957   CREATININE 1.02 09/25/2022 0957   CALCIUM 10.0 09/25/2022 0957   GFRNONAA 83.12 09/21/2009 0847    BNP No results found for: "BNP"  ProBNP No results found for: "PROBNP"  Imaging: No results found.   Assessment & Plan:   Severe sleep apnea - Patient has not seen benefit from wearing CPAP. Feels pressure is still too high. - Current pressure settings 8cm h20; Residual AHI 5.4/hour  -  Needs CPAP titration study in-lab   Insomnia -Patient has trouble falling and staying asleep. He has never slept well. He does not feel well rested. He has tried melatonin and  temazepam. He is hesitant to take prescription sleep aid. He will re-try melatonin 5-10mg  at bedtime. If sleep quality does not improve with melatonin or after CPAP titration study recommend trial of either Lunesta, Doxepin or Rozerem.    [name], NP 01/04/2023

## 2023-01-04 NOTE — Progress Notes (Signed)
Reviewed and agree with assessment/plan.   Coralyn Helling, MD [facility] Pulmonary/Critical Care 01/04/2023, 1:04 PM Pager:  [phone]

## 2023-01-04 NOTE — Assessment & Plan Note (Addendum)
-   Patient has not seen benefit from wearing CPAP. Feels pressure is still too high. - Current pressure settings 8cm h20; Residual AHI 5.4/hour  - Needs CPAP titration study in-lab

## 2023-02-19 MED ORDER — ATORVASTATIN CALCIUM 20 MG PO TABS: 20.0000 mg | ORAL_TABLET | Freq: Every day | ORAL | 3 refills | Status: DC

## 2023-03-29 ENCOUNTER — Ambulatory Visit: Payer: 59 | Admitting: Family Medicine

## 2023-03-29 ENCOUNTER — Encounter: Payer: Self-pay | Admitting: Family Medicine

## 2023-03-29 VITALS — BP 120/70 | HR 64 | Temp 97.0°F | Ht 66.0 in | Wt 167.6 lb

## 2023-03-29 DIAGNOSIS — R739 Hyperglycemia, unspecified: Secondary | ICD-10-CM | POA: Diagnosis not present

## 2023-03-29 DIAGNOSIS — R931 Abnormal findings on diagnostic imaging of heart and coronary circulation: Secondary | ICD-10-CM

## 2023-03-29 DIAGNOSIS — I10 Essential (primary) hypertension: Secondary | ICD-10-CM | POA: Diagnosis not present

## 2023-03-29 DIAGNOSIS — E785 Hyperlipidemia, unspecified: Secondary | ICD-10-CM

## 2023-03-29 DIAGNOSIS — E559 Vitamin D deficiency, unspecified: Secondary | ICD-10-CM

## 2023-03-29 DIAGNOSIS — Z131 Encounter for screening for diabetes mellitus: Secondary | ICD-10-CM | POA: Diagnosis not present

## 2023-03-29 DIAGNOSIS — E538 Deficiency of other specified B group vitamins: Secondary | ICD-10-CM | POA: Diagnosis not present

## 2023-03-29 DIAGNOSIS — Z91014 Allergy to mammalian meats: Secondary | ICD-10-CM

## 2023-03-29 LAB — COMPREHENSIVE METABOLIC PANEL
ALT: 68 U/L — ABNORMAL HIGH (ref 0–53)
AST: 36 U/L (ref 0–37)
Albumin: 5.2 g/dL (ref 3.5–5.2)
Alkaline Phosphatase: 53 U/L (ref 39–117)
BUN: 13 mg/dL (ref 6–23)
CO2: 25 meq/L (ref 19–32)
Calcium: 9.6 mg/dL (ref 8.4–10.5)
Chloride: 106 meq/L (ref 96–112)
Creatinine, Ser: 1.08 mg/dL (ref 0.40–1.50)
GFR: 73.05 mL/min (ref >60.00)
Glucose, Bld: 105 mg/dL — ABNORMAL HIGH (ref 70–99)
Potassium: 4 meq/L (ref 3.5–5.1)
Sodium: 139 meq/L (ref 135–145)
Total Bilirubin: 1 mg/dL (ref 0.2–1.2)
Total Protein: 7.7 g/dL (ref 6.0–8.3)

## 2023-03-29 LAB — CBC WITH DIFFERENTIAL/PLATELET
Basophils Absolute: 0 10*3/uL (ref 0.0–0.1)
Basophils Relative: 0.3 % (ref 0.0–3.0)
Eosinophils Absolute: 0.1 10*3/uL (ref 0.0–0.7)
Eosinophils Relative: 1.5 % (ref 0.0–5.0)
HCT: 50 % (ref 39.0–52.0)
Hemoglobin: 16.9 g/dL (ref 13.0–17.0)
Lymphocytes Relative: 16.1 % (ref 12.0–46.0)
Lymphs Abs: 1.3 10*3/uL (ref 0.7–4.0)
MCHC: 33.9 g/dL (ref 30.0–36.0)
MCV: 95.2 fL (ref 78.0–100.0)
Monocytes Absolute: 0.8 10*3/uL (ref 0.1–1.0)
Monocytes Relative: 9.3 % (ref 3.0–12.0)
Neutro Abs: 5.9 10*3/uL (ref 1.4–7.7)
Neutrophils Relative %: 72.8 % (ref 43.0–77.0)
Platelets: 164 10*3/uL (ref 150.0–400.0)
RBC: 5.25 Mil/uL (ref 4.22–5.81)
RDW: 12.9 % (ref 11.5–15.5)
WBC: 8.2 10*3/uL (ref 4.0–10.5)

## 2023-03-29 LAB — VITAMIN D 25 HYDROXY (VIT D DEFICIENCY, FRACTURES): VITD: 61.45 ng/mL (ref 30.00–100.00)

## 2023-03-29 LAB — LIPID PANEL
Cholesterol: 135 mg/dL (ref 0–200)
HDL: 48.2 mg/dL (ref >39.00)
LDL Cholesterol: 57 mg/dL (ref 0–99)
NonHDL: 87.13
Total CHOL/HDL Ratio: 3
Triglycerides: 151 mg/dL — ABNORMAL HIGH (ref 0.0–149.0)
VLDL: 30.2 mg/dL (ref 0.0–40.0)

## 2023-03-29 LAB — HEMOGLOBIN A1C: Hgb A1c MFr Bld: 5.6 % (ref 4.6–6.5)

## 2023-03-29 LAB — VITAMIN B12: Vitamin B-12: 700 pg/mL (ref 211–911)

## 2023-03-29 NOTE — Progress Notes (Signed)
Phone [phone] In person visit   Subjective:   Jordan Cantrell is a 62 y.o. year old very pleasant male patient who presents for/with See problem oriented charting Chief Complaint  Patient presents with   Medical Management of Chronic Issues    Wants vit b12, vit d and alpha gal checked   Hypertension   Hyperlipidemia   Past Medical History-  Patient Active Problem List   Diagnosis Date Noted   Agatston CAC score, >400 07/26/2022    Priority: High   Allergy to beef 02/17/2016    Priority: High   Tobacco abuse 11/02/2011    Priority: High   Aortic atherosclerosis (HCC) 07/26/2022    Priority: Medium    Hyperglycemia 03/17/2022    Priority: Medium    Hyperlipidemia, unspecified 09/30/2020    Priority: Medium    Insomnia 11/12/2012    Priority: Medium    Hypertension 11/12/2012    Priority: Medium    Migraine 09/30/2007    Priority: Medium    Osteoarthritis 07/22/2014    Priority: Low   History of colonic polyps 07/22/2014    Priority: Low   Urinary frequency 09/11/2011    Priority: Low   LOW BACK PAIN SYNDROME 09/24/2009    Priority: Low   Dry eyes 09/30/2007    Priority: Low   Allergic rhinitis 09/30/2007    Priority: Low   History of shingles 09/30/2007    Priority: Low   Severe sleep apnea 07/07/2022   Loud snoring 04/05/2022   Hemorrhoid 10/30/2014   Tinnitus, bilateral 01/31/2008    Medications- reviewed and updated Current Outpatient Medications  Medication Sig Dispense Refill   amLODipine (NORVASC) 10 MG tablet TAKE 1 TABLET BY MOUTH EVERY DAY 90 tablet 3   atorvastatin (LIPITOR) 20 MG tablet Take 1 tablet (20 mg total) by mouth daily. 90 tablet 3   betamethasone dipropionate 0.05 % cream PRN     diclofenac (VOLTAREN) 75 MG EC tablet DAILY     diphenhydrAMINE (BENADRYL) 25 MG tablet Take 25 mg by mouth every 6 (six) hours as needed for itching.     hydrocortisone 2.5 % cream PLEASE SEE ATTACHED FOR DETAILED DIRECTIONS     metoprolol succinate  (TOPROL-XL) 50 MG 24 hr tablet TAKE 2 TABLETS BY MOUTH EVERY DAY 180 tablet 3   metroNIDAZOLE (METROGEL) 0.75 % gel Apply topically daily. PRN     Multiple Vitamin (MULTI VITAMIN PO) Take by mouth.     pramoxine-hydrocortisone (ANALPRAM HC) cream Apply topically in the morning and at bedtime. (Patient taking differently: Apply topically in the morning and at bedtime. PRN) 57 g 3   SUMAtriptan (IMITREX) 50 MG tablet TAKE 1 TABLET BY MOUTH AS NEEDED MIGRAINE 6 tablet 10   tadalafil (CIALIS) 10 MG tablet Take 1 tablet (10 mg total) by mouth every 3 (three) days as needed for erectile dysfunction. 10 tablet 3   topiramate (TOPAMAX) 25 MG tablet Take 1 tablet (25 mg total) by mouth daily. 30 tablet 5   triamcinolone cream (KENALOG) 0.5 % Apply 1 application topically 2 (two) times daily. For 10 days to affected area (Patient taking differently: Apply 1 application  topically 2 (two) times daily. PRN) 30 g 0   Vitamin D, Ergocalciferol, (DRISDOL) 1.25 MG (50000 UNIT) CAPS capsule Take 1 capsule (50,000 Units total) by mouth every 7 (seven) days. 13 capsule 1   No current facility-administered medications for this visit.     Objective:  BP 120/70   Pulse 64  Temp (!) 97 F (36.1 C)   Ht 5\' 6"  (1.676 m)   Wt 167 lb 9.6 oz (76 kg)   SpO2 98%   BMI 27.05 kg/m  Gen: NAD, resting comfortably CV: RRR no murmurs rubs or gallops Lungs: CTAB no crackles, wheeze, rhonchi Ext: no edema Skin: warm, dry     Assessment and Plan   #Hypertension S: Compliant with amlodipine 10 mg, metoprolol 100 mg extended release A/P: stable- continue current medicines     #Hyperlipidemia-normal exercise tolerance test 03/24/2021.   S: Medication: atorvastatin 20 mg down from 40 mg - None prior to CT calcium scoring-started on atorvastatin 40 mg but he felt it worsened migraines so he stopped. Didn't tolerate rosuvatatin either - concerned on migraines -CT calcium at 400 march 2024 -also reports improved diet-  off 95% of pork, cheeseburger every 2 weeks- thankfully no reactions- still advised against with alpha gal  -feels could improve diet more - down 18 lbs since retired- was 184 in June 2019- has done great job with gradual weight loss Lab Results  Component Value Date   CHOL 105 10/24/2022   HDL 49 10/24/2022   LDLCALC 41 10/24/2022   LDLDIRECT 118.0 09/25/2022   TRIG 68 10/24/2022   CHOLHDL 2.1 10/24/2022  A/P: last cholesterol levels looked amazing- dose was reduced- we are hoping Low Density Lipoprotein (LDL cholesterol) still below 70 check today   # Alpha gal S:contracted around 2017 we believe. Alpha gal labs have improved but not completely normalized.  He does eat some pork and beef as above- thankfully no anaphylaxis- places where he was previously bit get agitated and swell up A/P: advised avoidance of beef, pork but he declines- he is aware of risks. Offered epi pen (fortunately no anaphylaxis)-he declines   #Migraines S: Compliant with sumatriptan-used just once in last month or so now on topamax  - he felt like atorvastatin worsened migraines previously but doing better now A/P: stable- continue current medicines    #Polycythemia-former smoker-has not smoked since 2015 but does get a lot of smoke exposure at work but now retired but has not normalized   - does still distal interphalangeal\- encouraged cessation  # Hyperglycemia/insulin resistance/prediabetes- a1c 5.19 Sep 2021 S:  Medication: none Exercise and diet- reports improving diet- weight stable Lab Results  Component Value Date   HGBA1C 5.7 09/25/2022   HGBA1C 6.1 03/17/2022   HGBA1C 5.8 09/15/2021        A/P: hopefully stable- update a1c today. Continue without meds for now   #Vitamin D deficiency- vitamin D 21 in 2024 S: Medication: high dose vitamin D- last dose today      A/P: if vitamin D looks good- likely can go to 1000 units a day   # B12 deficiency- 241 in 2024 S: Current treatment/medication (oral  vs. IM): 1000 mcg daily  A/P: hopefully improved- update b12 today. Continue current meds for now    Recommended follow up: Return in about 6 months (around 09/26/2023) for physical or sooner if needed.Schedule b4 you leave. Future Appointments  Date Time Provider Department Center  04/09/2023  8:00 PM Waymon Budge, MD [facility]  04/26/2023  8:30 AM [name], NP [facility] None  06/21/2023  9:00 AM Antony Madura, MD [facility] None   Lab/Order associations: half cup of coffee with creamer   ICD-10-CM   1. Allergy to beef  Z91.014     2. Agatston CAC score, >400  R93.1  3. Hyperglycemia  R73.9     4. Hyperlipidemia, unspecified hyperlipidemia type  E78.5     5. Primary hypertension  I10       No orders of the defined types were placed in this encounter.   Return precautions advised.  Tana Conch, MD

## 2023-03-29 NOTE — Patient Instructions (Addendum)
Please stop by lab before you go If you have mychart- we will send your results within 3 business days of [place] receiving them.  If you do not have mychart- we will call you about results within 5 business days of [place] receiving them.  *please also note that you will see labs on mychart as soon as they post. I will later go in and write notes on them- will say "notes from Dr. Durene Cal"   No changes today unless labs lead [place] to make changes   Recommended follow up: Return in about 6 months (around 09/26/2023) for physical or sooner if needed.Schedule b4 you leave.

## 2023-04-02 LAB — INTERPRETATION:

## 2023-04-02 LAB — ALPHA-GAL PANEL
Allergen, Mutton, f88: 0.9 kU/L — ABNORMAL HIGH
Allergen, Pork, f26: 0.45 kU/L — ABNORMAL HIGH
Beef: 1.59 kU/L — ABNORMAL HIGH
CLASS: 1
CLASS: 2
Class: 2
GALACTOSE-ALPHA-1,3-GALACTOSE IGE*: 16.2 kU/L — ABNORMAL HIGH (ref <0.10)

## 2023-04-09 ENCOUNTER — Ambulatory Visit (HOSPITAL_BASED_OUTPATIENT_CLINIC_OR_DEPARTMENT_OTHER): Payer: 59 | Attending: Primary Care | Admitting: Internal Medicine

## 2023-04-09 DIAGNOSIS — G4733 Obstructive sleep apnea (adult) (pediatric): Secondary | ICD-10-CM | POA: Insufficient documentation

## 2023-04-15 DIAGNOSIS — G4733 Obstructive sleep apnea (adult) (pediatric): Secondary | ICD-10-CM | POA: Diagnosis not present

## 2023-04-15 NOTE — Procedures (Signed)
     Patient Name: Jordan Cantrell, Jordan Cantrell Date: 04/09/2023 Gender: Male D.O.B: Feb 27, 1961 Age (years): 24 Referring Provider: Ames Dura NP Height (inches): 66 Interpreting Physician: Jetty Duhamel MD, ABSM Weight (lbs): 160 RPSGT: Shelah Lewandowsky BMI: 26 MRN: [id number] Neck Size: 16.00  CLINICAL INFORMATION The patient is referred for a CPAP titration to treat sleep apnea.  Date of NPSG, Split Night or HST: HST 06/26/22  AHI 36.1/hr, desaturation to 85%, body weight 173 lbs  SLEEP STUDY TECHNIQUE As per the AASM Manual for the Scoring of Sleep and Associated Events v2.3 (April 2016) with a hypopnea requiring 4% desaturations.  The channels recorded and monitored were frontal, central and occipital EEG, electrooculogram (EOG), submentalis EMG (chin), nasal and oral airflow, thoracic and abdominal wall motion, anterior tibialis EMG, snore microphone, electrocardiogram, and pulse oximetry. Continuous positive airway pressure (CPAP) was initiated at the beginning of the study and titrated to treat sleep-disordered breathing.  MEDICATIONS Medications self-administered by patient taken the night of the study : none reported  TECHNICIAN COMMENTS Comments added by technician: NONE Comments added by scorer: N/A  RESPIRATORY PARAMETERS Optimal PAP Pressure (cm): 9 AHI at Optimal Pressure (/hr): 2.4 Overall Minimal O2 (%): 85.0 Supine % at Optimal Pressure (%): 25 Minimal O2 at Optimal Pressure (%): 89.0   SLEEP ARCHITECTURE The study was initiated at 9:50:42 PM and ended at 4:49:34 AM.  Sleep onset time was 9.7 minutes and the sleep efficiency was 88.5%. The total sleep time was 370.5 minutes.  The patient spent 12.1% of the night in stage N1 sleep, 68.7% in stage N2 sleep, 0.0% in stage N3 and 19.2% in REM. Stage REM latency was 52.0 minutes  Wake after sleep onset was 38.6. Alpha intrusion was absent. Supine sleep was 24.16%.  CARDIAC DATA The 2 lead EKG demonstrated  sinus rhythm. The mean heart rate was 51.4 beats per minute. Other EKG findings include: PVCs.  LEG MOVEMENT DATA The total Periodic Limb Movements of Sleep (PLMS) were 0. The PLMS index was 0.0. A PLMS index of <15 is considered normal in adults.  IMPRESSIONS - The optimal PAP pressure was 9 cm of water. - Central sleep apnea was not noted during this titration (CAI = 1/h). - Moderate oxygen desaturations were observed during this titration (min O2 = 85.0%). On CPAP 9, minimum O2 saturation 89%, mean 93.1%. - The patient snored with soft snoring volume during this titration study. - 2-lead EKG demonstrated: PVCs - Clinically significant periodic limb movements were not noted during this study. Arousals associated with PLMs were rare.  DIAGNOSIS - Obstructive Sleep Apnea (G47.33)  RECOMMENDATIONS - Trial of CPAP therapy on 9 cm H2O or autopap 5-15. - Patient wore a Small size Resmed Nasal AirFit P30I mask and heated humidification. - Be careful with alcohol, sedatives and other CNS depressants that may worsen sleep apnea and disrupt normal sleep architecture. - Sleep hygiene should be reviewed to assess factors that may improve sleep quality. - Weight management and regular exercise should be initiated or continued.  [Electronically signed] 04/15/2023 12:42 PM  Jetty Duhamel MD, ABSM Diplomate, American Board of Sleep Medicine NPI: [id number]                        Jetty Duhamel Diplomate, American Board of Sleep Medicine  ELECTRONICALLY SIGNED ON:  04/15/2023, 12:36 PM [facility] PH: [phone]   FX: [phone] ACCREDITED BY THE AMERICAN ACADEMY OF SLEEP MEDICINE

## 2023-04-26 ENCOUNTER — Ambulatory Visit: Payer: 59 | Admitting: Primary Care

## 2023-06-12 ENCOUNTER — Encounter: Payer: Self-pay | Admitting: Primary Care

## 2023-06-12 IMAGING — CR DG CHEST 2V
2 series · 2 of 2 positions shown · non-contrast
Comparison: 11/02/2011

CLINICAL DATA: Left-sided chest pain and tightness for 10 days.

EXAM:
CHEST - 2 VIEW

[w chest pa]
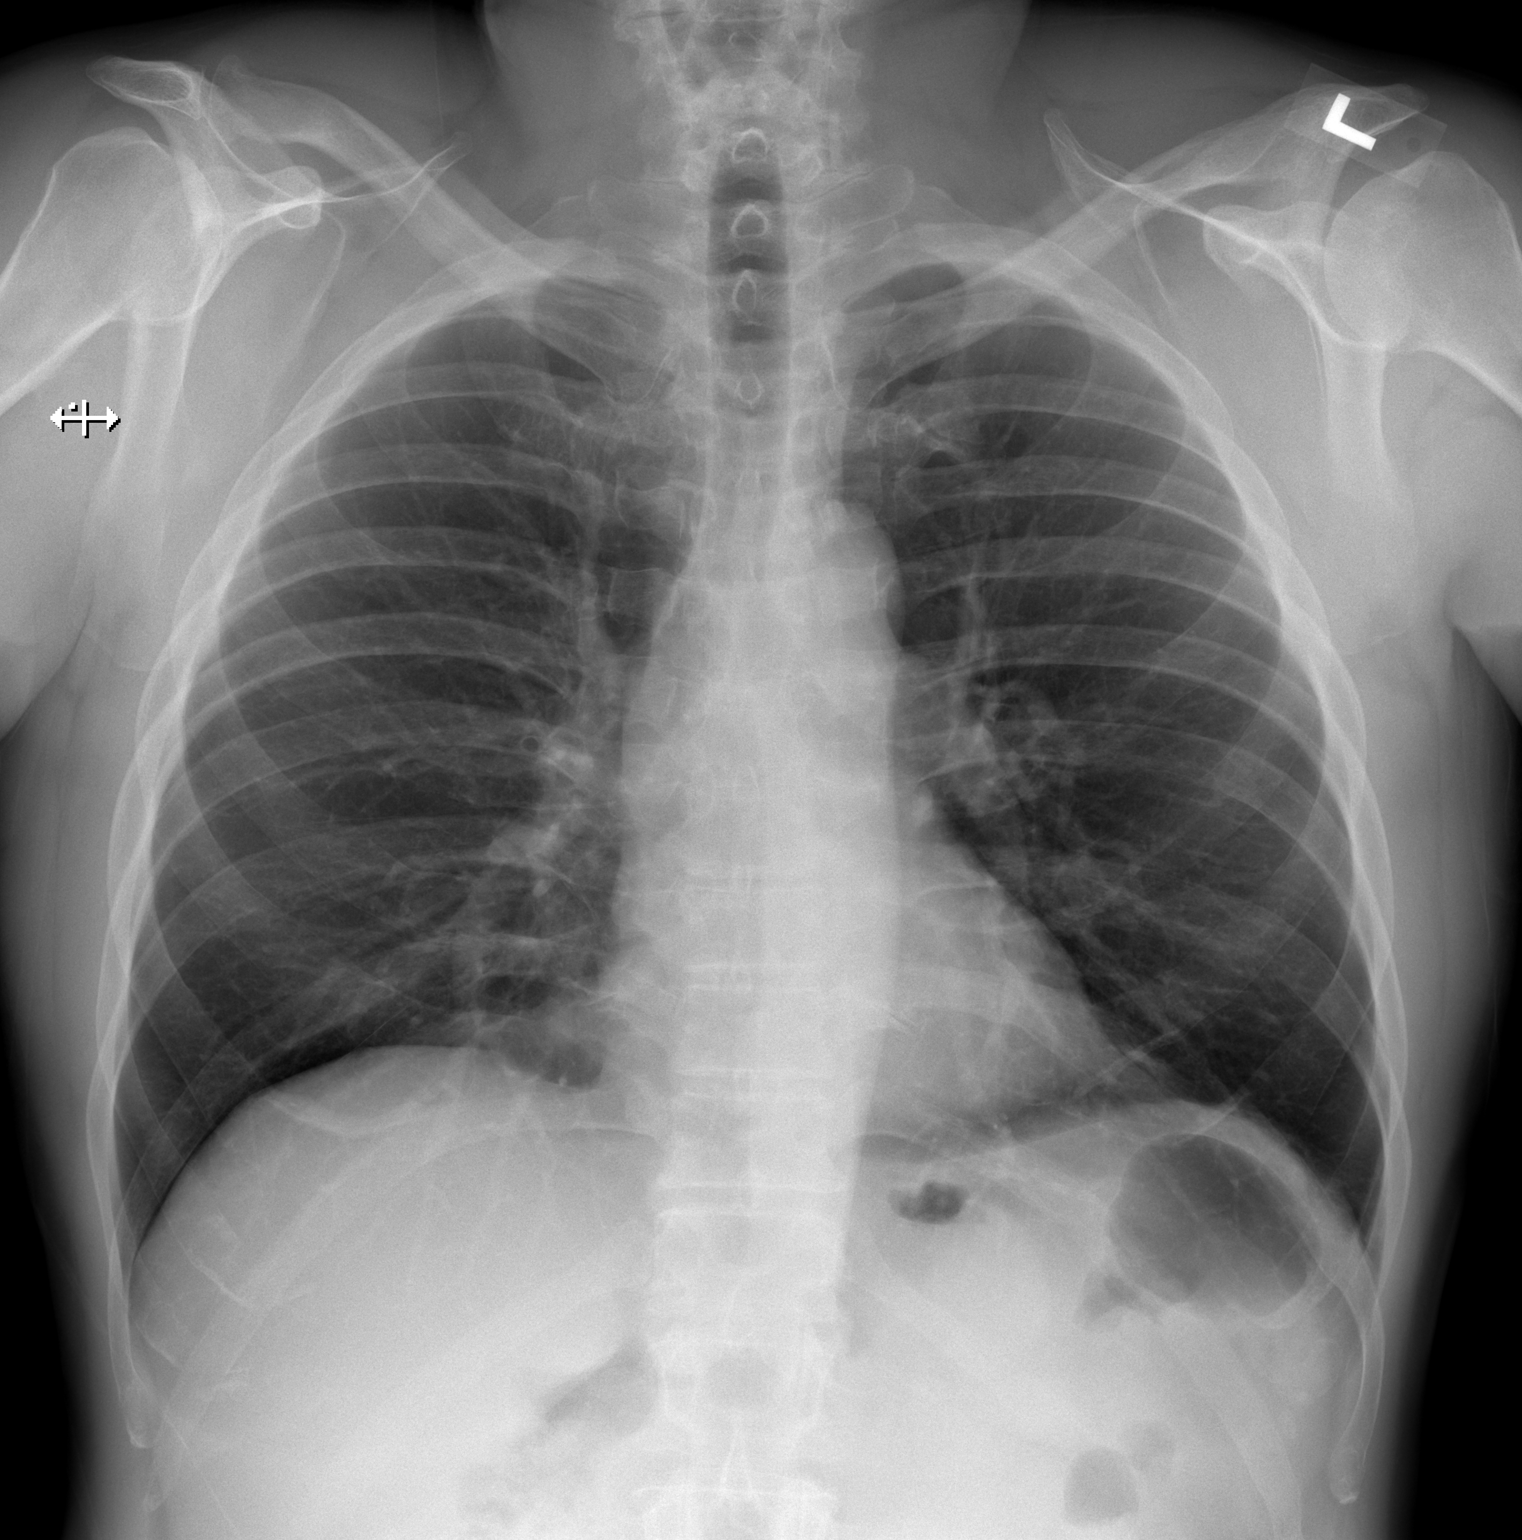

[w chest lat]
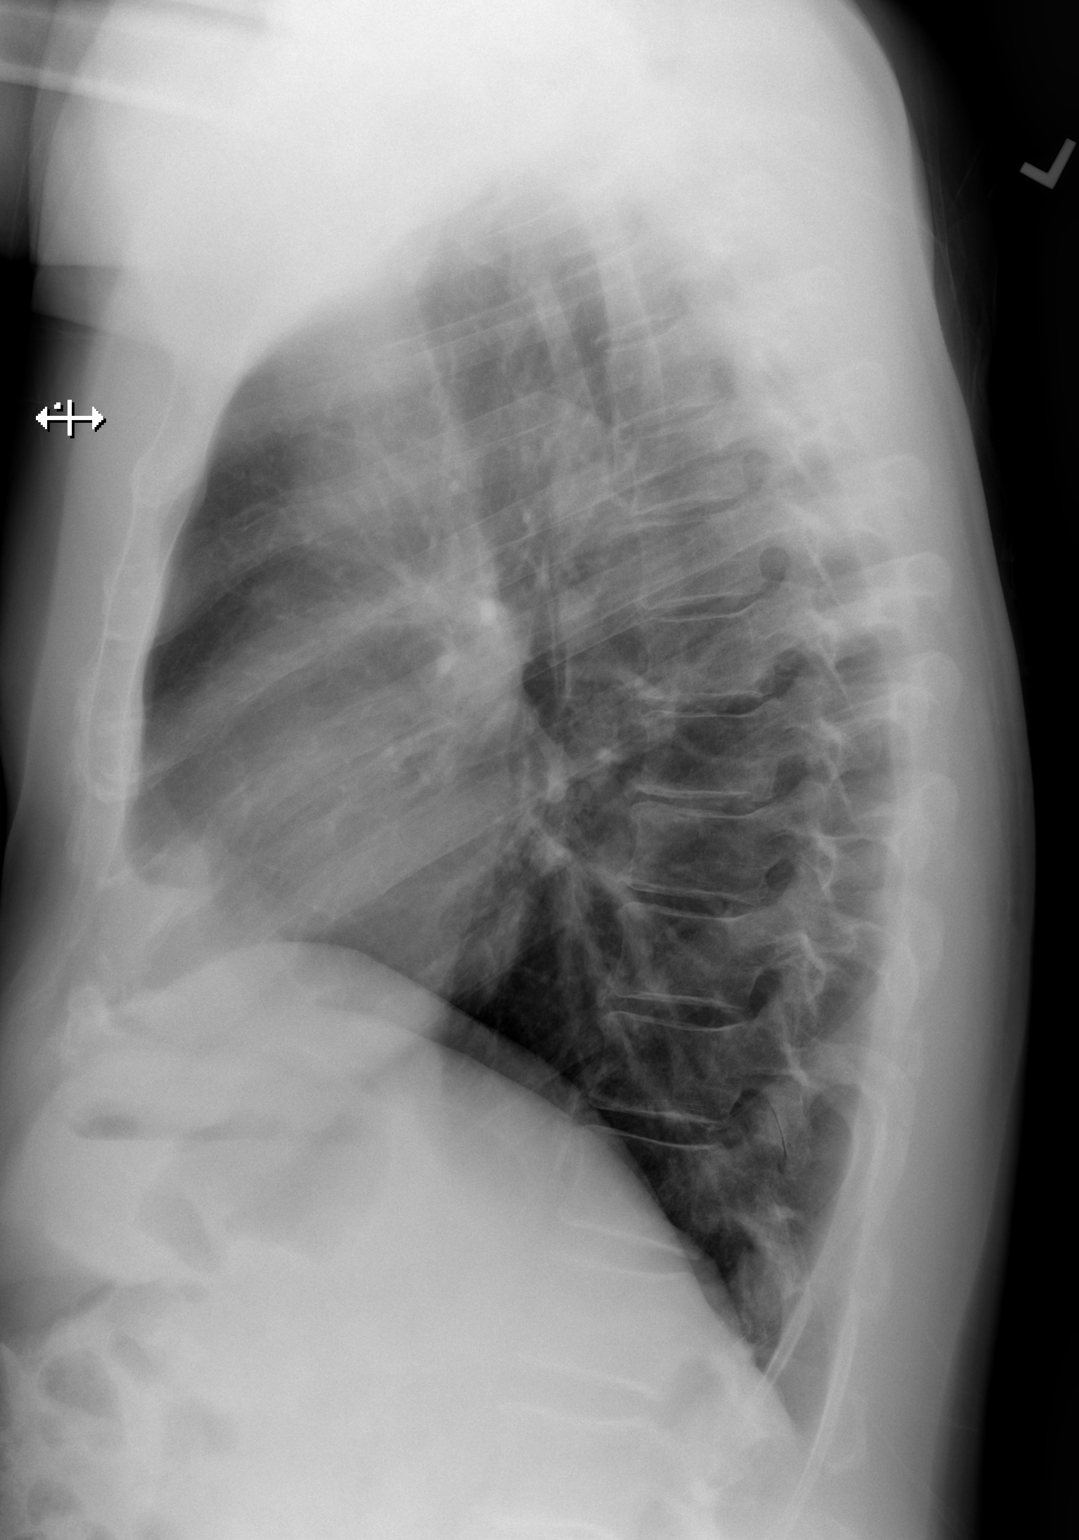

[2 of 2 positions shown; findings below may reference images not displayed]

FINDINGS: The heart size and mediastinal contours are within normal limits.
Both lungs are clear. The visualized skeletal structures are
unremarkable.
IMPRESSION: No active cardiopulmonary disease.

## 2023-06-13 NOTE — Progress Notes (Deleted)
 NEUROLOGY FOLLOW UP OFFICE NOTE  DARRAL RISHEL [id number]  Subjective:  KADYN GUILD is a 63 y.o. year old right-handed male with a medical history of severe OSA, HTN, HLD, insomnia, OA, former smoker who we last saw on 12/14/22 for headaches.  To briefly review: 09/28/22: Patient has migraines since at least the age of 43. The pain was previously so bad that he would seek treatment in the ED. Things improved over the last 1-2 decades though. The pain is on the right side of his face starting behind the eye. It is a soreness feeling. The pain is rated 2-3/10. He endorses photophobia, phonophobia, nausea, but no vomiting. When he gets a headache, he will go home and rest in a dark room with no sound. The symptoms will decrease and resolve in 30-45 minutes. He has 2-3 headaches per week. He mentions he may not ever be completely symptom free.    He endorses aura that prevents function. Previously this was tingling in his hands. Currently it is a colorful lightening bolt, semicircular images, and blurry vision in the peripheral vision. He will take sumatriptan  if his aura will not go away, but not with every headache.   Headaches do not wake him up from sleep. He does not have them when first waking, until he started CPAP.   Lately he feels the frequency of his migraines have increased (over the last few weeks). It may have increased with recent start of CPAP. As a result, he has stopped CPAP. Patient was also started on atorvastatin  but stopped this a couple of weeks ago due to this being a new medication and wondering if this was contributing. He was previously taking sudafed every day due to congestion. He is now doing netty pot every day.   Patient has changed his diet significantly due to some CAD. He has not seen a difference in his headaches since change in diet which was a few months ago.   He feels like bright lights are a trigger. He denies worsening with position (laying or  standing) or with straining. He may have some tearing in the left eye, but no running nose, ptosis, facial pallor/flushing/sweating, or aural fullness.   Family history of headaches: No   Social history: Caffiene use: 1-2 cups of coffee every morning Tobacco use: dip tobacco EtOH use: 1-2 mixed drinks per day Drug use: No Sleep: Unclear - patient has difficulty assessing Mood: Generally okay, but can be down when he has a lot of migraines   Current headache medications (including over the counter): Sumatriptan  - gets 6 per month, usually does not use them all - generally helps with headaches/aura Diclofenac for OA (1 every day)   Metoprolol  100 mg daily (started for migraine prevention decades ago, but also for HTN) Magnesium 420 mg - just started this week   Previous medications tried (effectiveness, side effects, why stopped):  None    At the end of the appointment, patient also pulled out a list of other complaints including leg twitches/cramps, and tinnitus. We did not fully discuss today.  12/14/22: B12 was borderline low. I recommended supplementing with B12 1000 mcg daily. Patient starting taking a B supplement (not sure which), but did not like how it agreed with him. He will look into this again.   CT head showed no significant brain abnormalities. Small fluid level in the right maxillary sinus and mucosal thickening in the sinuses were noted.   Patient is taking Topamax  25 mg  daily. He has not had any headaches since starting the medication. He has not had to use Sumatriptan .   Patient has not yet seen ophtho, but is scheduled in the next couple of months.   At last visit, patient mentioned leg twitching/cramps. The twitching has improved. Every once in a while, he will get woken up with cramps. He denies any numbness or tingling in legs.   Patient also has chronic tinnitus. It seems to be getting worse over time. He used to work at [employer] and has been told he has  hearing loss in the past.  Most recent Assessment and Plan (12/14/22): This is JESTIN BURBACH, a 63 y.o. male with: Migraine with aura - Previously was having ~3 headaches per week, but currently having none since starting Topamax . Patient was prescribed metoprolol  100 mg daily for migraines in the past (also for cardiac reasons), but this did not help with headaches B12 deficiency Tinnitus - likely secondary to hearing loss Muscle cramps - occasional, at night. No associated numbness, tingling, or weakness of legs   Plan: -B12 1000 mcg daily or talk to PCP if he prefers injection -Gave OTC recommendations for cramps -Discussed audiology referral, but patient will defer for now -For migraines: Migraine prevention:  Continue Topamax  25 mg daily Migraine rescue:  Continue Sumatriptan  50 mg PRN at headache onset, may repeat dose after 2 hours if needed, but only once Limit use of pain relievers to no more than 2 days out of week to prevent risk of rebound or medication-overuse headache. Keep headache diary  Since their last visit: Patient saw [facility] on 02/08/23 with a normal evaluation (notes under media).  Headaches?***  Medications: *** Side effects: ***  MEDICATIONS:  Outpatient Encounter Medications as of 06/21/2023  Medication Sig   amLODipine  (NORVASC ) 10 MG tablet TAKE 1 TABLET BY MOUTH EVERY DAY   atorvastatin  (LIPITOR) 20 MG tablet Take 1 tablet (20 mg total) by mouth daily.   betamethasone dipropionate 0.05 % cream PRN   diclofenac (VOLTAREN) 75 MG EC tablet DAILY   diphenhydrAMINE  (BENADRYL ) 25 MG tablet Take 25 mg by mouth every 6 (six) hours as needed for itching.   hydrocortisone  2.5 % cream PLEASE SEE ATTACHED FOR DETAILED DIRECTIONS   metoprolol  succinate (TOPROL -XL) 50 MG 24 hr tablet TAKE 2 TABLETS BY MOUTH EVERY DAY   metroNIDAZOLE (METROGEL) 0.75 % gel Apply topically daily. PRN   Multiple Vitamin (MULTI VITAMIN PO) Take by mouth.    pramoxine-hydrocortisone  (ANALPRAM  HC) cream Apply topically in the morning and at bedtime. (Patient taking differently: Apply topically in the morning and at bedtime. PRN)   SUMAtriptan  (IMITREX ) 50 MG tablet TAKE 1 TABLET BY MOUTH AS NEEDED MIGRAINE   tadalafil  (CIALIS ) 10 MG tablet Take 1 tablet (10 mg total) by mouth every 3 (three) days as needed for erectile dysfunction.   topiramate  (TOPAMAX ) 25 MG tablet Take 1 tablet (25 mg total) by mouth daily.   triamcinolone  cream (KENALOG ) 0.5 % Apply 1 application topically 2 (two) times daily. For 10 days to affected area (Patient taking differently: Apply 1 application  topically 2 (two) times daily. PRN)   Vitamin D , Ergocalciferol , (DRISDOL ) 1.25 MG (50000 UNIT) CAPS capsule Take 1 capsule (50,000 Units total) by mouth every 7 (seven) days.   No facility-administered encounter medications on file as of 06/21/2023.    PAST MEDICAL HISTORY: Past Medical History:  Diagnosis Date   Allergic rhinitis, cause unspecified    to tick bite -  resolved   Arthritis    knees, hands   Balanoposthitis    Mild   Chest pain, unspecified    2007- stress test normal   Constipation 09/24/2009   Prn metamucil  - occasional   Dry eye syndrome    GERD (gastroesophageal reflux disease)    Hearing loss    slight - bilateral - no hearing aids   HEMORRHOID, EXTERNAL, THROMBOSED 05/06/2010   Qualifier: Diagnosis of  By: Joshua MD, Debby CROME.    History of shingles    Hypertension    Migraine, unspecified, without mention of intractable migraine without mention of status migrainosus    Severe sleep apnea 07/07/2022   TINNITUS NOS 01/31/2008   Qualifier: Diagnosis of  By: Garald MD, Karlynn GAILS     PAST SURGICAL HISTORY: Past Surgical History:  Procedure Laterality Date   COLONOSCOPY  05/16/2011    hx polyps    FOOT FRACTURE SURGERY  05/16/1991   right-crushed all 4 toes reportedly   KNEE ARTHROSCOPY  05/15/2009   Right for Condromalacia   ORIF FEMUR  FRACTURE  05/16/1991   Right   POLYPECTOMY     WISDOM TOOTH EXTRACTION      ALLERGIES: Allergies  Allergen Reactions   Codeine Nausea Only    FAMILY HISTORY: Family History  Problem Relation Age of Onset   Diabetes type II Mother    Heart disease Mother        CABG-died during this, smoker   Hyperlipidemia Mother    Hypertension Mother    Diabetes Father    Heart disease Father        thought death due to this, smoker   Hypertension Father    Schizophrenia Father    Colon polyps Sister    Colon cancer Neg Hx    Stomach cancer Neg Hx    Esophageal cancer Neg Hx    Rectal cancer Neg Hx    Crohn's disease Neg Hx     SOCIAL HISTORY: Social History   Tobacco Use   Smoking status: Former    Current packs/day: 0.00    Types: Cigarettes    Quit date: 04/28/2014    Years since quitting: 9.1    Passive exposure: Past (PARENTS SMOKED)   Smokeless tobacco: Current    Types: Snuff   Tobacco comments:    Reported Dipping at this time- 09/28/22  Vaping Use   Vaping status: Never Used  Substance Use Topics   Alcohol use: Yes    Alcohol/week: 10.0 - 12.0 standard drinks of alcohol    Types: 10 - 12 Cans of beer per week    Comment: beer/liquor   Drug use: No   Social History   Social History Narrative   Married-'81. 1 dtr - '87, 1 son - '83, 2 granddaughters   Marriage in good health.       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       Hobbies: time with grandkids, work with cows, minimal travel due to pain   Are you right handed or left handed? right   Are you currently employed ?    What is your current occupation?   Do you live at home alone?no   Who lives with you? wife   What type of home do you live in:  1 story or 2 story? two    Caffeine 1 cup daily      Objective:  Vital Signs:  There  were no vitals taken for this visit.  ***  Labs and Imaging review: New results: ***  Previously reviewed results: B12 (09/28/22): 241 Lipid panel (10/24/22): wnl        Lab Results  Component Value Date    HGBA1C 5.7 09/25/2022               Lab Results  Component Value Date    TSH 1.63 09/15/2021      CBC (03/17/22) unremarkable CMP (03/17/22) unremarkable   CT head wo contrast (11/06/22): FINDINGS: Brain: No evidence of acute infarction, hemorrhage, hydrocephalus, extra-axial collection or mass lesion/mass effect.   Vascular: No hyperdense vessels.  No unexpected calcification   Skull: Normal. Negative for fracture or focal lesion.   Sinuses/Orbits: Small fluid level right maxillary sinus. Mucosal thickening in the sinuses   Other: None   IMPRESSION: 1. Negative non contrasted CT appearance of the brain 2. Maxillary sinusitis  Assessment/Plan:  This is Sheena JULIANNA Seats, a 63 y.o. male with: ***   Plan: ***  Return to clinic in ***  Total time spent reviewing records, interview, history/exam, documentation, and coordination of care on day of encounter:  *** min  Venetia Potters, MD

## 2023-06-15 ENCOUNTER — Telehealth: Payer: 59 | Admitting: Family Medicine

## 2023-06-15 ENCOUNTER — Ambulatory Visit: Payer: 59 | Admitting: Primary Care

## 2023-06-15 DIAGNOSIS — J111 Influenza due to unidentified influenza virus with other respiratory manifestations: Secondary | ICD-10-CM | POA: Diagnosis not present

## 2023-06-15 DIAGNOSIS — J029 Acute pharyngitis, unspecified: Secondary | ICD-10-CM

## 2023-06-15 DIAGNOSIS — R062 Wheezing: Secondary | ICD-10-CM | POA: Diagnosis not present

## 2023-06-15 MED ORDER — AZITHROMYCIN 250 MG PO TABS: ORAL_TABLET | ORAL | 0 refills | Status: AC

## 2023-06-15 MED ORDER — PREDNISONE 20 MG PO TABS: 20.0000 mg | ORAL_TABLET | Freq: Two times a day (BID) | ORAL | 0 refills | Status: DC

## 2023-06-15 MED ORDER — PROMETHAZINE-DM 6.25-15 MG/5ML PO SYRP: 5.0000 mL | ORAL_SOLUTION | Freq: Four times a day (QID) | ORAL | 0 refills | Status: DC | PRN

## 2023-06-15 NOTE — Progress Notes (Signed)
Virtual Visit Consent   Jordan Cantrell, you are scheduled for a virtual visit with a [facility] provider today. Just as with appointments in the office, your consent must be obtained to participate. Your consent will be active for this visit and any virtual visit you may have with one of our providers in the next 365 days. If you have a MyChart account, a copy of this consent can be sent to you electronically.  As this is a virtual visit, video technology does not allow for your provider to perform a traditional examination. This may limit your provider's ability to fully assess your condition. If your provider identifies any concerns that need to be evaluated in person or the need to arrange testing (such as labs, EKG, etc.), we will make arrangements to do so. Although advances in technology are sophisticated, we cannot ensure that it will always work on either your end or our end. If the connection with a video visit is poor, the visit may have to be switched to a telephone visit. With either a video or telephone visit, we are not always able to ensure that we have a secure connection.  By engaging in this virtual visit, you consent to the provision of healthcare and authorize for your insurance to be billed (if applicable) for the services provided during this visit. Depending on your insurance coverage, you may receive a charge related to this service.  I need to obtain your verbal consent now. Are you willing to proceed with your visit today? Jordan Cantrell has provided verbal consent on 06/15/2023 for a virtual visit (video or telephone). Georgana Curio, FNP  Date: 06/15/2023 12:22 PM  Virtual Visit via Video Note   I, Georgana Curio, connected with  Jordan Cantrell  ([id number], 02-05-1961) on 06/15/23 at 12:15 PM EST by a video-enabled telemedicine application and verified that I am speaking with the correct person using two identifiers.  Location: Patient: Virtual Visit Location Patient:  Home Provider: Virtual Visit Location Provider: Home Office   I discussed the limitations of evaluation and management by telemedicine and the availability of in person appointments. The patient expressed understanding and agreed to proceed.    History of Present Illness: Jordan Cantrell is a 63 y.o. who identifies as a male who was assigned male at birth, and is being seen today for fever bodyaches chills, cough, sx started a couple of weeks ago with sore throat worsened Monday-4-5 days ago. Marland Kitchen  HPI: HPI  Problems:  Patient Active Problem List   Diagnosis Date Noted   Aortic atherosclerosis (HCC) 07/26/2022   Agatston CAC score, >400 07/26/2022   OSA (obstructive sleep apnea) 07/07/2022   Loud snoring 04/05/2022   Hyperglycemia 03/17/2022   Hyperlipidemia, unspecified 09/30/2020   Allergy to beef 02/17/2016   Hemorrhoid 10/30/2014   Osteoarthritis 07/22/2014   History of colonic polyps 07/22/2014   Insomnia 11/12/2012   Hypertension 11/12/2012   Tobacco abuse 11/02/2011   Urinary frequency 09/11/2011   LOW BACK PAIN SYNDROME 09/24/2009   Tinnitus, bilateral 01/31/2008   Migraine 09/30/2007   Dry eyes 09/30/2007   Allergic rhinitis 09/30/2007   History of shingles 09/30/2007    Allergies:  Allergies  Allergen Reactions   Codeine Nausea Only   Medications:  Current Outpatient Medications:    amLODipine (NORVASC) 10 MG tablet, TAKE 1 TABLET BY MOUTH EVERY DAY, Disp: 90 tablet, Rfl: 3   atorvastatin (LIPITOR) 20 MG tablet, Take 1 tablet (20 mg total) by mouth  daily., Disp: 90 tablet, Rfl: 3   betamethasone dipropionate 0.05 % cream, PRN, Disp: , Rfl:    diclofenac (VOLTAREN) 75 MG EC tablet, DAILY, Disp: , Rfl:    diphenhydrAMINE (BENADRYL) 25 MG tablet, Take 25 mg by mouth every 6 (six) hours as needed for itching., Disp: , Rfl:    hydrocortisone 2.5 % cream, PLEASE SEE ATTACHED FOR DETAILED DIRECTIONS, Disp: , Rfl:    metoprolol succinate (TOPROL-XL) 50 MG 24 hr tablet,  TAKE 2 TABLETS BY MOUTH EVERY DAY, Disp: 180 tablet, Rfl: 3   metroNIDAZOLE (METROGEL) 0.75 % gel, Apply topically daily. PRN, Disp: , Rfl:    Multiple Vitamin (MULTI VITAMIN PO), Take by mouth., Disp: , Rfl:    pramoxine-hydrocortisone (ANALPRAM HC) cream, Apply topically in the morning and at bedtime. (Patient taking differently: Apply topically in the morning and at bedtime. PRN), Disp: 57 g, Rfl: 3   SUMAtriptan (IMITREX) 50 MG tablet, TAKE 1 TABLET BY MOUTH AS NEEDED MIGRAINE, Disp: 6 tablet, Rfl: 10   tadalafil (CIALIS) 10 MG tablet, Take 1 tablet (10 mg total) by mouth every 3 (three) days as needed for erectile dysfunction., Disp: 10 tablet, Rfl: 3   topiramate (TOPAMAX) 25 MG tablet, Take 1 tablet (25 mg total) by mouth daily., Disp: 30 tablet, Rfl: 5   triamcinolone cream (KENALOG) 0.5 %, Apply 1 application topically 2 (two) times daily. For 10 days to affected area (Patient taking differently: Apply 1 application  topically 2 (two) times daily. PRN), Disp: 30 g, Rfl: 0   Vitamin D, Ergocalciferol, (DRISDOL) 1.25 MG (50000 UNIT) CAPS capsule, Take 1 capsule (50,000 Units total) by mouth every 7 (seven) days., Disp: 13 capsule, Rfl: 1  Observations/Objective: Patient is well-developed, well-nourished in no acute distress.  Resting comfortably  at home.  Head is normocephalic, atraumatic.  No labored breathing.  Speech is clear and coherent with logical content.  Patient is alert and oriented at baseline.    Assessment and Plan: 1. Influenza-like illness (Primary)  2. Pharyngitis, unspecified etiology  3. Wheezing  Increase fluid, dayquil, tylenol, UC if sx worsen After 48 hour window for tamiflu. In no distress. Humidifier at night.   Follow Up Instructions: I discussed the assessment and treatment plan with the patient. The patient was provided an opportunity to ask questions and all were answered. The patient agreed with the plan and demonstrated an understanding of the  instructions.  A copy of instructions were sent to the patient via MyChart unless otherwise noted below.     The patient was advised to call back or seek an in-person evaluation if the symptoms worsen or if the condition fails to improve as anticipated.    Georgana Curio, FNP

## 2023-06-15 NOTE — Patient Instructions (Signed)
Influenza, Adult Influenza is also called the flu. It's an infection that affects your respiratory tract. This includes your nose, throat, windpipe, and lungs. The flu is contagious. This means it spreads easily from person to person. It causes symptoms that are like a cold. It can also cause a high fever and body aches. What are the causes? The flu is caused by the influenza virus. You can get it by: Breathing in droplets that are in the air after an infected person coughs or sneezes. Touching something that has the virus on it and then touching your mouth, nose, or eyes. What increases the risk? You may be more likely to get the flu if: You don't wash your hands often. You're near a lot of people during cold and flu season. You touch your mouth, eyes, or nose without washing your hands first. You don't get a flu shot each year. You may also be more at risk for the flu and serious problems, such as a lung infection called pneumonia, if: You're older than 65. You're pregnant. Your immune system is weak. Your immune system is your body's defense system. You have a long-term, or chronic, condition, such as: Heart, kidney, or lung disease. Diabetes. A liver disorder. Asthma. You're very overweight. You have anemia. This is when you don't have enough red blood cells in your body. What are the signs or symptoms? Flu symptoms often start all of a sudden. They may last 4-14 days and include: Fever and chills. Headaches, body aches, or muscle aches. Sore throat. Cough. Runny or stuffy nose. Discomfort in your chest. Not wanting to eat as much as normal. Feeling weak or tired. Feeling dizzy. Nausea or vomiting. How is this diagnosed? The flu may be diagnosed based on your symptoms and medical history. You may also have a physical exam. A swab may be taken from your nose or throat and tested for the virus. How is this treated? If the flu is found early, you can be treated with antiviral  medicine. This may be given to you by mouth or through an IV. It can help you feel less sick and get better faster. Taking care of yourself at home can also help your symptoms get better. Your health care provider may tell you to: Take over-the-counter medicines. Drink lots of fluids. The flu often goes away on its own. If you have very bad symptoms or problems caused by the flu, you may need to be treated in a hospital. Follow these instructions at home: Activity Rest as needed. Get lots of sleep. Stay home from work or school as told by your provider. Leave home only to go see your provider. Do not leave home for other reasons until you don't have a fever for 24 hours without taking medicine. Eating and drinking Take an oral rehydration solution (ORS). This is a drink that is sold at pharmacies and stores. Drink enough fluid to keep your pee pale yellow. Try to drink small amounts of clear fluids. These include water, ice chips, fruit juice mixed with water, and low-calorie sports drinks. Try to eat bland foods that are easy to digest. These include bananas, applesauce, rice, lean meats, toast, and crackers. Avoid drinks that have a lot of sugar or caffeine in them. These include energy drinks, regular sports drinks, and soda. Do not drink alcohol. Do not eat spicy or fatty foods. General instructions     Take your medicines only as told by your provider. Use a cool mist humidifier  to add moisture to the air in your home. This can make it easier for you to breathe. You should also clean the humidifier every day. To do so: Empty the water. Pour clean water in. Cover your mouth and nose when you cough or sneeze. Wash your hands with soap and water often and for at least 20 seconds. It's extra important to do so after you cough or sneeze. If you can't use soap and water, use hand sanitizer. How is this prevented?  Get a flu shot every year. Ask your provider when you should get your flu  shot. Stay away from people who are sick during fall and winter. Fall and winter are cold and flu season. Contact a health care provider if: You get new symptoms. You have chest pain. You have watery poop, also called diarrhea. You have a fever. Your cough gets worse. You start to have more mucus. You feel like you may vomit, or you vomit. Get help right away if: You become short of breath or have trouble breathing. Your skin or nails turn blue. You have very bad pain or stiffness in your neck. You get a sudden headache or pain in your face or ear. You vomit each time you eat or drink. These symptoms may be an emergency. Call 911 right away. Do not wait to see if the symptoms will go away. Do not drive yourself to the hospital. This information is not intended to replace advice given to you by your health care provider. Make sure you discuss any questions you have with your health care provider. Document Revised: 02/01/2023 Document Reviewed: 06/08/2022 Elsevier Patient Education  2024 [employer]Pharyngitis  Pharyngitis is inflammation of the throat (pharynx). It is a very common cause of sore throat. Pharyngitis can be caused by a bacteria, but it is usually caused by a virus. Most cases of pharyngitis get better on their own without treatment. What are the causes? This condition may be caused by: Infection by viruses (viral). Viral pharyngitis spreads easily from person to person (is contagious) through coughing, sneezing, and sharing of personal items or utensils such as cups, forks, spoons, and toothbrushes. Infection by bacteria (bacterial). Bacterial pharyngitis may be spread by touching the nose or face after coming in contact with the bacteria, or through close contact, such as kissing. Allergies. Allergies can cause buildup of mucus in the throat (post-nasal drip), leading to inflammation and irritation. Allergies can also cause blocked nasal passages, forcing breathing  through the mouth, which dries and irritates the throat. What increases the risk? You are more likely to develop this condition if: You are 34-42 years old. You are exposed to crowded environments such as daycare, school, or dormitory living. You live in a cold climate. You have a weakened disease-fighting (immune) system. What are the signs or symptoms? Symptoms of this condition vary by the cause. Common symptoms of this condition include: Sore throat. Fatigue. Low-grade fever. Stuffy nose (nasal congestion) and cough. Headache. Other symptoms may include: Glands in the neck (lymph nodes) that are swollen. Skin rashes. Plaque-like film on the throat or tonsils. This is often a symptom of bacterial pharyngitis. Vomiting. Red, itchy eyes (conjunctivitis). Loss of appetite. Joint pain and muscle aches. Enlarged tonsils. How is this diagnosed? This condition may be diagnosed based on your medical history and a physical exam. Your health care provider will ask you questions about your illness and your symptoms. A swab of your throat may be done to check for bacteria (  rapid strep test). Other lab tests may also be done, depending on the suspected cause, but these are rare. How is this treated? Many times, treatment is not needed for this condition. Pharyngitis usually gets better in 3-4 days without treatment. Bacterial pharyngitis may be treated with antibiotic medicines. Follow these instructions at home: Medicines Take over-the-counter and prescription medicines only as told by your health care provider. If you were prescribed an antibiotic medicine, take it as told by your health care provider. Do not stop taking the antibiotic even if you start to feel better. Use throat sprays to soothe your throat as told by your health care provider. Children can get pharyngitis. Do not give your child aspirin because of the association with Reye's syndrome. Managing pain To help with pain,  try: Sipping warm liquids, such as broth, herbal tea, or warm water. Eating or drinking cold or frozen liquids, such as frozen ice pops. Gargling with a mixture of salt and water 3-4 times a day or as needed. To make salt water, completely dissolve -1 tsp (3-6 g) of salt in 1 cup (237 mL) of warm water. Sucking on hard candy or throat lozenges. Putting a cool-mist humidifier in your bedroom at night to moisten the air. Sitting in the bathroom with the door closed for 5-10 minutes while you run hot water in the shower.  General instructions  Do not use any products that contain nicotine or tobacco. These products include cigarettes, chewing tobacco, and vaping devices, such as e-cigarettes. If you need help quitting, ask your health care provider. Rest as told by your health care provider. Drink enough fluid to keep your urine pale yellow. How is this prevented? To help prevent becoming infected or spreading infection: Wash your hands often with soap and water for at least 20 seconds. If soap and water are not available, use hand sanitizer. Do not touch your eyes, nose, or mouth with unwashed hands, and wash hands after touching these areas. Do not share cups or eating utensils. Avoid close contact with people who are sick. Contact a health care provider if: You have large, tender lumps in your neck. You have a rash. You cough up green, yellow-brown, or bloody mucus. Get help right away if: Your neck becomes stiff. You drool or are unable to swallow liquids. You cannot drink or take medicines without vomiting. You have severe pain that does not go away, even after you take medicine. You have trouble breathing, and it is not caused by a stuffy nose. You have new pain and swelling in your joints such as the knees, ankles, wrists, or elbows. These symptoms may represent a serious problem that is an emergency. Do not wait to see if the symptoms will go away. Get medical help right away.  Call your local emergency services (911 in the [place] Do not drive yourself to the hospital. Summary Pharyngitis is redness, pain, and swelling (inflammation) of the throat (pharynx). While pharyngitis can be caused by a bacteria, the most common causes are viral. Most cases of pharyngitis get better on their own without treatment. Bacterial pharyngitis is treated with antibiotic medicines. This information is not intended to replace advice given to you by your health care provider. Make sure you discuss any questions you have with your health care provider. Document Revised: 07/28/2020 Document Reviewed: 07/28/2020 Elsevier Patient Education  2024 [employer].

## 2023-06-18 ENCOUNTER — Ambulatory Visit: Payer: Self-pay | Admitting: Family Medicine

## 2023-06-18 NOTE — Telephone Encounter (Signed)
Chief Complaint: Cough Symptoms: Dry cough and fatigued  Frequency: Constant Pertinent Negatives: Patient denies fever, congestion, chest pain Disposition: [] ED /[] Urgent Care (no appt availability in office) / [x] Appointment(In office/virtual)/ []  [facility]/ [] Home Care/ [] Refused Recommended Disposition /[] [facility]/ []  Follow-up with PCP Additional Notes: Patient was seen 06/15/23 via virtual urgent care and was treated for flu like symptoms with Azithromycin, prednisone, and promethazine. Patient states he has one more dose of prednisone and 2 more days left of antibiotics but the cough has not improved at all.  Patient reports continued fatigue as well. Advised patient that the cough is usually the last symptoms to improve and to keep taking medication as ordered. Patient is requesting an appointment for further evaluation. Care advice given and appointment has been scheduled on 06/20/23. Patient will call to cancel if symptoms get better tomorrow.  Copied from CRM [id number]. Topic: Appointments - Appointment Scheduling >> Jun 18, 2023  4:25 PM Geneva B wrote: Patient/patient representative is calling to schedule an appointment. Refer to attachments for appointment information. Patient is calling in because he has a cough that will not go away and has pain when he cough Reason for Disposition  [1] Recent medical visit within 24 hours AND [2] condition / symptoms WORSE  Answer Assessment - Initial Assessment Questions 1. MAIN CONCERN OR SYMPTOM:  "What is your main concern right now?" "What question do you have?" "What's the main symptom you're worried about?" (e.g., breathing difficulty, cough, fever. pain)     Cough 2. ONSET: "When did the  cough  start?"     1 week ago  3. BETTER-SAME-WORSE: "Are you getting better, staying the same, or getting worse compared to how you felt at your last visit to the doctor (most recent medical visit)?"     Worse  4. VISIT DATE: "When  were you seen?" (Date)     06/15/23 5. VISIT DOCTOR: "What is the name of the doctor taking care of you now?"     [facility] 6. VISIT DIAGNOSIS:  "What was the main symptom or problem that you were seen for?" "Were you given a diagnosis?"      Flu-like symptoms 7. VISIT MEDICINES: "Did the doctor order any new medicines for you to use?" If Yes, ask: "Have you filled the prescription and started taking the medicine?"      Antibiotic, Prednisone, Promethazine 8. NEXT APPOINTMENT: "Have you scheduled a follow-up appointment with your doctor?"     No  9. PAIN: "Is there any pain?" If Yes, ask: "How bad is it?"  (Scale 0-10; or mild, moderate, severe)    - NONE (0): no pain    - MILD (1-3): doesn't interfere with normal activities     - MODERATE (4-7): interferes with normal activities or awakens from sleep     - SEVERE (8-10): excruciating pain, unable to do any normal activities     No  10. FEVER: "Do you have a fever?" If Yes, ask: "What is it, how was it measured  and when did it start?"       No  11. OTHER SYMPTOMS: "Do you have any other symptoms?"       Fatigue  Protocols used: Recent Medical Visit for Illness Follow-up Call-[facility]

## 2023-06-19 NOTE — Telephone Encounter (Signed)
Pt is schedule to be seen in office tomorrow with Dulce Sellar.

## 2023-06-20 ENCOUNTER — Ambulatory Visit: Payer: 59 | Admitting: Family

## 2023-06-20 ENCOUNTER — Encounter: Payer: Self-pay | Admitting: Family

## 2023-06-20 VITALS — BP 134/84 | HR 67 | Temp 97.3°F | Ht 66.0 in | Wt 166.0 lb

## 2023-06-20 DIAGNOSIS — J208 Acute bronchitis due to other specified organisms: Secondary | ICD-10-CM

## 2023-06-20 MED ORDER — PROMETHAZINE-DM 6.25-15 MG/5ML PO SYRP: 5.0000 mL | ORAL_SOLUTION | Freq: Four times a day (QID) | ORAL | 0 refills | Status: AC | PRN

## 2023-06-20 MED ORDER — ALBUTEROL SULFATE HFA 108 (90 BASE) MCG/ACT IN AERS: 2.0000 | INHALATION_SPRAY | Freq: Four times a day (QID) | RESPIRATORY_TRACT | 0 refills | Status: DC | PRN

## 2023-06-20 MED ORDER — PREDNISONE 20 MG PO TABS: ORAL_TABLET | ORAL | 0 refills | Status: DC

## 2023-06-20 NOTE — Progress Notes (Signed)
 Patient ID: Jordan Cantrell, male    DOB: 1960-05-23, 63 y.o.   MRN: [id number]  Chief Complaint  Patient presents with   Cough    Pt c/o cough, Fever 101.3. Present for 1 week before virtual visit on 06/18/2023.Unable to take deep, Has tried netipot, z pack, prednisone  and promethazine  syrup which did not help.    Discussed the use of AI scribe software for clinical note transcription with the patient, who gave verbal consent to proceed.  History of Present Illness   Jordan Cantrell is a 63 year old male who presents with persistent cough and respiratory symptoms. He has been experiencing a persistent cough that has not resolved over the last 3 weeks, described as severe with occasional production of dark sputum. He uses a neti pot in the mornings, which sometimes results in epistaxis. There is no significant nasal discharge or sinus pressure, but he notes occasional upper teeth pain. He experiences wheezing and clicking sounds, especially when active. He had a fever of 101.70F 2 weeks ago marking the initial onset of his symptoms. He mentions feeling better intermittently, with good days followed by bad days. During a virtual visit on January 31st, he was advised that he might have the flu, but he has not had a fever since that visit. He has not received a flu shot this year. He was given a Zpack, prednisone  and cough syrup at that visit which he finished yesterday, & thinks the cough syrup might have helped slightly. He has not used Mucinex or any generic equivalent. He reports hx of smoking long ago, and does not recall ever using an inhaler in the past.     Assessment & Plan:     Viral bronchitis -  Persistent cough for approximately 3 weeks with intermittent fever. No significant sputum production. No dyspnea or wheezing, lungs clear today. Recent use of abt, steroids, and cough syrup with limited improvement. -Refill Prednisone  40mg  x3d, 20mg  x2d. -Continue cough syrup as  needed. -Start Albuterol  inhaler, use in the morning and an hour before bedtime, advised on use & SE. -Consider use of cough lozenges, continue use of Neti pot qam. -Continue hydration with at least 2L water qd and use of humidifier overnight. -Check in if symptoms worsen or do not improve.     Subjective:    Outpatient Medications Prior to Visit  Medication Sig Dispense Refill   amLODipine  (NORVASC ) 10 MG tablet TAKE 1 TABLET BY MOUTH EVERY DAY 90 tablet 3   atorvastatin  (LIPITOR) 20 MG tablet Take 1 tablet (20 mg total) by mouth daily. 90 tablet 3   betamethasone dipropionate 0.05 % cream PRN     diclofenac (VOLTAREN) 75 MG EC tablet DAILY     diphenhydrAMINE  (BENADRYL ) 25 MG tablet Take 25 mg by mouth every 6 (six) hours as needed for itching.     hydrocortisone  2.5 % cream PLEASE SEE ATTACHED FOR DETAILED DIRECTIONS     metoprolol  succinate (TOPROL -XL) 50 MG 24 hr tablet TAKE 2 TABLETS BY MOUTH EVERY DAY 180 tablet 3   metroNIDAZOLE (METROGEL) 0.75 % gel Apply topically daily. PRN     Multiple Vitamin (MULTI VITAMIN PO) Take by mouth.     pramoxine-hydrocortisone  (ANALPRAM  HC) cream Apply topically in the morning and at bedtime. (Patient taking differently: Apply topically in the morning and at bedtime. PRN) 57 g 3   promethazine -dextromethorphan (PROMETHAZINE -DM) 6.25-15 MG/5ML syrup Take 5 mLs by mouth 4 (four) times daily as needed for up  to 10 days for cough. 118 mL 0   SUMAtriptan  (IMITREX ) 50 MG tablet TAKE 1 TABLET BY MOUTH AS NEEDED MIGRAINE 6 tablet 10   tadalafil  (CIALIS ) 10 MG tablet Take 1 tablet (10 mg total) by mouth every 3 (three) days as needed for erectile dysfunction. 10 tablet 3   topiramate  (TOPAMAX ) 25 MG tablet Take 1 tablet (25 mg total) by mouth daily. 30 tablet 5   triamcinolone  cream (KENALOG ) 0.5 % Apply 1 application topically 2 (two) times daily. For 10 days to affected area (Patient taking differently: Apply 1 application  topically 2 (two) times daily. PRN)  30 g 0   Vitamin D , Ergocalciferol , (DRISDOL ) 1.25 MG (50000 UNIT) CAPS capsule Take 1 capsule (50,000 Units total) by mouth every 7 (seven) days. 13 capsule 1   azithromycin  (ZITHROMAX ) 250 MG tablet Take 2 tablets on day 1, then 1 tablet daily on days 2 through 5 (Patient not taking: Reported on 06/20/2023) 6 tablet 0   predniSONE  (DELTASONE ) 20 MG tablet Take 1 tablet (20 mg total) by mouth 2 (two) times daily with a meal for 5 days. (Patient not taking: Reported on 06/20/2023) 10 tablet 0   No facility-administered medications prior to visit.   Past Medical History:  Diagnosis Date   Allergic rhinitis, cause unspecified    to tick bite - resolved   Arthritis    knees, hands   Balanoposthitis    Mild   Chest pain, unspecified    2007- stress test normal   Constipation 09/24/2009   Prn metamucil  - occasional   Dry eye syndrome    GERD (gastroesophageal reflux disease)    Hearing loss    slight - bilateral - no hearing aids   HEMORRHOID, EXTERNAL, THROMBOSED 05/06/2010   Qualifier: Diagnosis of  By: Joshua MD, Debby CROME.    History of shingles    Hypertension    Migraine, unspecified, without mention of intractable migraine without mention of status migrainosus    Severe sleep apnea 07/07/2022   TINNITUS NOS 01/31/2008   Qualifier: Diagnosis of  By: Garald MD, Karlynn GAILS    Past Surgical History:  Procedure Laterality Date   COLONOSCOPY  05/16/2011    hx polyps    FOOT FRACTURE SURGERY  05/16/1991   right-crushed all 4 toes reportedly   KNEE ARTHROSCOPY  05/15/2009   Right for Condromalacia   ORIF FEMUR FRACTURE  05/16/1991   Right   POLYPECTOMY     WISDOM TOOTH EXTRACTION     Allergies  Allergen Reactions   Codeine Nausea Only      Objective:    Physical Exam Vitals and nursing note reviewed.  Constitutional:      General: He is not in acute distress.    Appearance: Normal appearance. He is ill-appearing.  HENT:     Head: Normocephalic.     Right Ear: Tympanic  membrane and ear canal normal.     Left Ear: Tympanic membrane and ear canal normal.     Nose:     Right Sinus: Frontal sinus tenderness present. No maxillary sinus tenderness.     Left Sinus: Frontal sinus tenderness present. No maxillary sinus tenderness.     Mouth/Throat:     Mouth: Mucous membranes are moist.     Pharynx: No pharyngeal swelling, oropharyngeal exudate, posterior oropharyngeal erythema or uvula swelling.     Tonsils: No tonsillar exudate or tonsillar abscesses.  Cardiovascular:     Rate and Rhythm: Normal rate and regular  rhythm.  Pulmonary:     Effort: Pulmonary effort is normal.     Breath sounds: Normal breath sounds.  Musculoskeletal:        General: Normal range of motion.     Cervical back: Normal range of motion.  Lymphadenopathy:     Head:     Right side of head: No preauricular or posterior auricular adenopathy.     Left side of head: No preauricular or posterior auricular adenopathy.     Cervical: No cervical adenopathy.  Skin:    General: Skin is warm and dry.  Neurological:     Mental Status: He is alert and oriented to person, place, and time.  Psychiatric:        Mood and Affect: Mood normal.    BP 134/84 (BP Location: Left Arm, Patient Position: Sitting, Cuff Size: Normal)   Pulse 67   Temp (!) 97.3 F (36.3 C) (Temporal)   Ht 5' 6 (1.676 m)   Wt 166 lb (75.3 kg)   SpO2 97%   BMI 26.79 kg/m  Wt Readings from Last 3 Encounters:  06/20/23 166 lb (75.3 kg)  04/09/23 160 lb (72.6 kg)  03/29/23 167 lb 9.6 oz (76 kg)      Lucius Krabbe, NP

## 2023-06-21 ENCOUNTER — Ambulatory Visit: Payer: Self-pay | Admitting: Neurology

## 2023-07-05 ENCOUNTER — Other Ambulatory Visit: Payer: Self-pay | Admitting: Neurology

## 2023-07-05 DIAGNOSIS — G43E09 Chronic migraine with aura, not intractable, without status migrainosus: Secondary | ICD-10-CM

## 2023-07-17 ENCOUNTER — Other Ambulatory Visit: Payer: Self-pay | Admitting: Family

## 2023-07-17 DIAGNOSIS — J208 Acute bronchitis due to other specified organisms: Secondary | ICD-10-CM

## 2023-07-24 ENCOUNTER — Encounter: Payer: Self-pay | Admitting: Primary Care

## 2023-07-24 ENCOUNTER — Ambulatory Visit (INDEPENDENT_AMBULATORY_CARE_PROVIDER_SITE_OTHER): Payer: 59 | Admitting: Primary Care

## 2023-07-24 VITALS — BP 128/64 | HR 70 | Temp 97.6°F | Ht 66.0 in | Wt 170.0 lb

## 2023-07-24 DIAGNOSIS — G4733 Obstructive sleep apnea (adult) (pediatric): Secondary | ICD-10-CM

## 2023-07-24 DIAGNOSIS — G43909 Migraine, unspecified, not intractable, without status migrainosus: Secondary | ICD-10-CM | POA: Diagnosis not present

## 2023-07-24 DIAGNOSIS — F17220 Nicotine dependence, chewing tobacco, uncomplicated: Secondary | ICD-10-CM | POA: Diagnosis not present

## 2023-07-24 NOTE — Progress Notes (Signed)
 @Patient  ID: Jordan Cantrell, male    DOB: 01-Oct-1960, 63 y.o.   MRN: [id number]  Chief Complaint  Patient presents with   Follow-up    Referring provider: Shelva Majestic, MD  HPI: 63 year old male, former smoker. PMH significant for aortic atherosclerosis, HTN, OSA, allergic rhinitis, hyperlipidemia, insomnia.   07/24/2023 Discussed the use of AI scribe software for clinical note transcription with the patient, who gave verbal consent to proceed.  History of Present Illness   The patient presents with follow-up for sleep apnea management.  He was last seen over six months ago for sleep apnea management. A titration study indicated that the optimal CPAP pressure is 9cm h20, showing mainly obstructive events. He experienced soft snoring and premature ventricular contractions during apneic events. He was fitted with a small ResMed nasal mask, but it did not work with his machine.  He attempted to use his CPAP for about a week but experienced significant dry throat, waking up two to three times a night for water despite using humidification. He has tried adjusting the humidification level but associates the higher pressure with increased dryness. He reports no improvement in how he feels with CPAP use and has not tried a full face mask. He is unsure if he sleeps with his mouth open and does not have acid reflux or heartburn. He experiences dryness in his mouth when working outside and needs to drink water frequently. He has not used the CPAP consistently and feels he sleeps better without it. He maintains the CPAP equipment by cleaning it every two weeks but is not actively using it.  He is currently taking topiramate for headaches, which he takes at night. It has significantly reduced his headache frequency from four or five a week to one or two. However, he experiences apathy as a side effect, describing it as not caring about things that would typically concern him.  He was unable to  attend a neurology follow-up in February due to illness with the flu, which was severe and included symptoms of coughing and difficulty breathing. No acid reflux or heartburn. No thoughts of self-harm.      Airview download 04/23/23-07/21/23 Usage days 8/90 (9%) Average usage days used 3 hours 53 mins Pressure 8cm h20 Airleaks 10.5L/min (95%) AHI 5.0   Allergies  Allergen Reactions   Codeine Nausea Only    Immunization History  Administered Date(s) Administered   Influenza,inj,Quad PF,6+ Mos 02/17/2016, 03/21/2017, 03/01/2018, 03/19/2019, 03/04/2020   Influenza,inj,quad, With Preservative 02/28/2018   Influenza-Unspecified 03/29/2014, 02/28/2018   Tdap 11/12/2013    Past Medical History:  Diagnosis Date   Allergic rhinitis, cause unspecified    to tick bite - resolved   Arthritis    knees, hands   Balanoposthitis    Mild   Chest pain, unspecified    2007- stress test normal   Constipation 09/24/2009   Prn metamucil  - occasional   Dry eye syndrome    GERD (gastroesophageal reflux disease)    Hearing loss    slight - bilateral - no hearing aids   HEMORRHOID, EXTERNAL, THROMBOSED 05/06/2010   Qualifier: Diagnosis of  By: Yetta Barre MD, Bernadene Bell.    History of shingles    Hypertension    Migraine, unspecified, without mention of intractable migraine without mention of status migrainosus    Severe sleep apnea 07/07/2022   TINNITUS NOS 01/31/2008   Qualifier: Diagnosis of  By: Plotnikov MD, Georgina Quint     Tobacco History: Social  History   Tobacco Use  Smoking Status Former   Current packs/day: 0.00   Types: Cigarettes   Quit date: 04/28/2014   Years since quitting: 9.2   Passive exposure: Past ("PARENTS SMOKED")  Smokeless Tobacco Current   Types: Snuff  Tobacco Comments   Reported Dipping at this time- 09/28/22   Ready to quit: Not Answered Counseling given: Not Answered Tobacco comments: Reported Dipping at this time- 09/28/22   Outpatient Medications Prior to  Visit  Medication Sig Dispense Refill   albuterol (VENTOLIN HFA) 108 (90 Base) MCG/ACT inhaler INHALE 2 PUFFS INTO THE LUNGS EVERY 6 (SIX) HOURS AS NEEDED FOR WHEEZING OR SHORTNESS OF BREATH (COUGH USE IN AM AND 1 HOUR BEFORE BEDTIME FOR NEXT FEW DAYS OR UNTIL COUGH IS RESOLVED.). 8 g 0   amLODipine (NORVASC) 10 MG tablet TAKE 1 TABLET BY MOUTH EVERY DAY 90 tablet 3   atorvastatin (LIPITOR) 20 MG tablet Take 1 tablet (20 mg total) by mouth daily. 90 tablet 3   betamethasone dipropionate 0.05 % cream PRN     diclofenac (VOLTAREN) 75 MG EC tablet DAILY     diphenhydrAMINE (BENADRYL) 25 MG tablet Take 25 mg by mouth every 6 (six) hours as needed for itching.     hydrocortisone 2.5 % cream PLEASE SEE ATTACHED FOR DETAILED DIRECTIONS     metoprolol succinate (TOPROL-XL) 50 MG 24 hr tablet TAKE 2 TABLETS BY MOUTH EVERY DAY 180 tablet 3   metroNIDAZOLE (METROGEL) 0.75 % gel Apply topically daily. PRN     Multiple Vitamin (MULTI VITAMIN PO) Take by mouth.     pramoxine-hydrocortisone (ANALPRAM HC) cream Apply topically in the morning and at bedtime. (Patient taking differently: Apply topically in the morning and at bedtime. PRN) 57 g 3   SUMAtriptan (IMITREX) 50 MG tablet TAKE 1 TABLET BY MOUTH AS NEEDED MIGRAINE 6 tablet 10   tadalafil (CIALIS) 10 MG tablet Take 1 tablet (10 mg total) by mouth every 3 (three) days as needed for erectile dysfunction. 10 tablet 3   topiramate (TOPAMAX) 25 MG tablet TAKE 1 TABLET (25 MG TOTAL) BY MOUTH DAILY. 30 tablet 3   triamcinolone cream (KENALOG) 0.5 % Apply 1 application topically 2 (two) times daily. For 10 days to affected area (Patient taking differently: Apply 1 application  topically 2 (two) times daily. PRN) 30 g 0   predniSONE (DELTASONE) 20 MG tablet Take 2 pills in the morning with breakfast for 3 days, then 1 pill for 2 days 8 tablet 0   Vitamin D, Ergocalciferol, (DRISDOL) 1.25 MG (50000 UNIT) CAPS capsule Take 1 capsule (50,000 Units total) by mouth every  7 (seven) days. 13 capsule 1   No facility-administered medications prior to visit.    Review of Systems  Review of Systems  Constitutional: Negative.   HENT:         Dry mouth  Respiratory: Negative.    Cardiovascular: Negative.    Physical Exam  BP 128/64 (BP Location: Left Arm, Patient Position: Sitting, Cuff Size: Normal)   Pulse 70   Temp 97.6 F (36.4 C) (Temporal)   Ht 5\' 6"  (1.676 m)   Wt 170 lb (77.1 kg)   SpO2 99%   BMI 27.44 kg/m  Physical Exam Constitutional:      General: He is not in acute distress.    Appearance: Normal appearance. He is not ill-appearing.  HENT:     Mouth/Throat:     Mouth: Mucous membranes are moist.  Pharynx: Oropharynx is clear.  Cardiovascular:     Rate and Rhythm: Normal rate and regular rhythm.  Pulmonary:     Effort: Pulmonary effort is normal.     Breath sounds: Normal breath sounds. No wheezing or rhonchi.  Musculoskeletal:        General: Normal range of motion.  Skin:    General: Skin is warm and dry.  Neurological:     General: No focal deficit present.     Mental Status: He is alert and oriented to person, place, and time. Mental status is at baseline.  Psychiatric:        Mood and Affect: Mood normal.        Behavior: Behavior normal.        Thought Content: Thought content normal.        Judgment: Judgment normal.      Lab Results:  CBC    Component Value Date/Time   WBC 8.2 03/29/2023 0907   RBC 5.25 03/29/2023 0907   HGB 16.9 03/29/2023 0907   HCT 50.0 03/29/2023 0907   PLT 164.0 03/29/2023 0907   MCV 95.2 03/29/2023 0907   MCHC 33.9 03/29/2023 0907   RDW 12.9 03/29/2023 0907   LYMPHSABS 1.3 03/29/2023 0907   MONOABS 0.8 03/29/2023 0907   EOSABS 0.1 03/29/2023 0907   BASOSABS 0.0 03/29/2023 0907    BMET    Component Value Date/Time   NA 139 03/29/2023 0907   K 4.0 03/29/2023 0907   CL 106 03/29/2023 0907   CO2 25 03/29/2023 0907   GLUCOSE 105 (H) 03/29/2023 0907   BUN 13 03/29/2023  0907   CREATININE 1.08 03/29/2023 0907   CALCIUM 9.6 03/29/2023 0907   GFRNONAA 83.12 09/21/2009 0847    BNP No results found for: "BNP"  ProBNP No results found for: "PROBNP"  Imaging: No results found.   Assessment & Plan:   1. OSA (obstructive sleep apnea) (Primary) - Ambulatory Referral for DME  Assessment and Plan    Obstructive Sleep Apnea Moderate obstructive sleep apnea improved from severe with CPAP. Current CPAP pressure at 8 cm H2O reduces apneic events to 5/hour. Dry mouth likely due to CPAP and topiramate. Apathy and dissatisfaction with CPAP noted. Discussed alternative treatments, but he is not interested. Explained risks of untreated sleep apnea. - Provided information on Inspire device and oral appliance for future consideration. - Recommended trying a hybrid full face mask to address dry mouth. - Adjust CPAP pressure to 9 cm H2O if he resumes CPAP use. - Encouraged CPAP use even if only a few days a week. - Advised avoiding supine position during sleep and consider using a wedge pillow.  Premature Ventricular Contractions (PVCs) PVCs noted on EKG during sleep study, likely related to apneic events. Not an acute concern.  Migraine Headaches Topiramate reduced migraine frequency. Apathy as a side effect is manageable. Awaiting neurology follow-up in June.  Flu Severe flu in February impacted CPAP use and overall health.      [name], NP 07/24/2023

## 2023-07-24 NOTE — Patient Instructions (Signed)
-  OBSTRUCTIVE SLEEP APNEA: Obstructive sleep apnea is a condition where your airway becomes blocked during sleep, causing breathing pauses. Your CPAP therapy has reduced the severity of your sleep apnea, but you are experiencing dry mouth and dissatisfaction with the current setup. We discussed trying a hybrid full face mask to help with the dryness and adjusting the CPAP pressure to 9 cm H2O if you resume use. It's important to use the CPAP even if only a few days a week and to avoid sleeping on your back. Consider using a wedge pillow to help with this.  -MIGRAINE HEADACHES: Your migraines have been better managed with topiramate, reducing their frequency. However, you are experiencing apathy as a side effect. We will continue to monitor this and await your neurology follow-up in June.  INSTRUCTIONS: Recommend trying hybrid full face mask with your CPAP to address the dry mouth issue. Adjust the CPAP pressure to 9 cm H2O if you resume use. Use the CPAP even if only a few days a week and avoid sleeping on your back. Consider using a wedge pillow. Follow up with neurology in June for your migraines.  Follow-up 6 months with Beth NP/notify me if you would like to try hybrid full face mask or discuss INSPIRE in more detail

## 2023-09-14 ENCOUNTER — Telehealth: Payer: Self-pay | Admitting: Neurology

## 2023-09-14 ENCOUNTER — Encounter: Payer: Self-pay | Admitting: Neurology

## 2023-09-14 NOTE — Telephone Encounter (Signed)
 Patient states that he would like to try something different other than  topamax . He states that it is working but causing some other issues such as not having much  empathy.  [facility] uses the [facility] in [place]

## 2023-09-28 ENCOUNTER — Encounter: Payer: Self-pay | Admitting: Family Medicine

## 2023-09-28 ENCOUNTER — Ambulatory Visit: Payer: Self-pay | Admitting: Family Medicine

## 2023-09-28 ENCOUNTER — Ambulatory Visit (INDEPENDENT_AMBULATORY_CARE_PROVIDER_SITE_OTHER): Payer: 59 | Admitting: Family Medicine

## 2023-09-28 VITALS — BP 128/72 | HR 65 | Temp 97.3°F | Ht 66.0 in | Wt 169.4 lb

## 2023-09-28 DIAGNOSIS — Z0000 Encounter for general adult medical examination without abnormal findings: Secondary | ICD-10-CM

## 2023-09-28 DIAGNOSIS — Z131 Encounter for screening for diabetes mellitus: Secondary | ICD-10-CM

## 2023-09-28 DIAGNOSIS — R739 Hyperglycemia, unspecified: Secondary | ICD-10-CM | POA: Diagnosis not present

## 2023-09-28 DIAGNOSIS — Z125 Encounter for screening for malignant neoplasm of prostate: Secondary | ICD-10-CM

## 2023-09-28 DIAGNOSIS — Z91014 Allergy to mammalian meats: Secondary | ICD-10-CM | POA: Diagnosis not present

## 2023-09-28 DIAGNOSIS — E785 Hyperlipidemia, unspecified: Secondary | ICD-10-CM | POA: Diagnosis not present

## 2023-09-28 DIAGNOSIS — E538 Deficiency of other specified B group vitamins: Secondary | ICD-10-CM | POA: Diagnosis not present

## 2023-09-28 DIAGNOSIS — I10 Essential (primary) hypertension: Secondary | ICD-10-CM

## 2023-09-28 DIAGNOSIS — E559 Vitamin D deficiency, unspecified: Secondary | ICD-10-CM

## 2023-09-28 LAB — COMPREHENSIVE METABOLIC PANEL WITH GFR
ALT: 75 U/L — ABNORMAL HIGH (ref 0–53)
AST: 46 U/L — ABNORMAL HIGH (ref 0–37)
Albumin: 5.3 g/dL — ABNORMAL HIGH (ref 3.5–5.2)
Alkaline Phosphatase: 59 U/L (ref 39–117)
BUN: 13 mg/dL (ref 6–23)
CO2: 28 meq/L (ref 19–32)
Calcium: 9.7 mg/dL (ref 8.4–10.5)
Chloride: 105 meq/L (ref 96–112)
Creatinine, Ser: 1.01 mg/dL (ref 0.40–1.50)
GFR: 78.89 mL/min (ref >60.00)
Glucose, Bld: 106 mg/dL — ABNORMAL HIGH (ref 70–99)
Potassium: 4 meq/L (ref 3.5–5.1)
Sodium: 141 meq/L (ref 135–145)
Total Bilirubin: 1 mg/dL (ref 0.2–1.2)
Total Protein: 7.6 g/dL (ref 6.0–8.3)

## 2023-09-28 LAB — CBC WITH DIFFERENTIAL/PLATELET
Basophils Absolute: 0 10*3/uL (ref 0.0–0.1)
Basophils Relative: 0.6 % (ref 0.0–3.0)
Eosinophils Absolute: 0.1 10*3/uL (ref 0.0–0.7)
Eosinophils Relative: 1.4 % (ref 0.0–5.0)
HCT: 47.6 % (ref 39.0–52.0)
Hemoglobin: 16 g/dL (ref 13.0–17.0)
Lymphocytes Relative: 32.8 % (ref 12.0–46.0)
Lymphs Abs: 2 10*3/uL (ref 0.7–4.0)
MCHC: 33.6 g/dL (ref 30.0–36.0)
MCV: 93.5 fl (ref 78.0–100.0)
Monocytes Absolute: 0.8 10*3/uL (ref 0.1–1.0)
Monocytes Relative: 13.5 % — ABNORMAL HIGH (ref 3.0–12.0)
Neutro Abs: 3.1 10*3/uL (ref 1.4–7.7)
Neutrophils Relative %: 51.7 % (ref 43.0–77.0)
Platelets: 182 10*3/uL (ref 150.0–400.0)
RBC: 5.09 Mil/uL (ref 4.22–5.81)
RDW: 13 % (ref 11.5–15.5)
WBC: 6 10*3/uL (ref 4.0–10.5)

## 2023-09-28 LAB — LIPID PANEL
Cholesterol: 139 mg/dL (ref 0–200)
HDL: 54.7 mg/dL (ref >39.00)
LDL Cholesterol: 43 mg/dL (ref 0–99)
NonHDL: 84.7
Total CHOL/HDL Ratio: 3
Triglycerides: 209 mg/dL — ABNORMAL HIGH (ref 0.0–149.0)
VLDL: 41.8 mg/dL — ABNORMAL HIGH (ref 0.0–40.0)

## 2023-09-28 LAB — URINALYSIS, ROUTINE W REFLEX MICROSCOPIC
Bilirubin Urine: NEGATIVE
Hgb urine dipstick: NEGATIVE
Ketones, ur: NEGATIVE
Leukocytes,Ua: NEGATIVE
Nitrite: NEGATIVE
RBC / HPF: NONE SEEN (ref 0–?)
Specific Gravity, Urine: <=1.005 — AB (ref 1.000–1.030)
Total Protein, Urine: NEGATIVE
Urine Glucose: NEGATIVE
Urobilinogen, UA: 0.2 (ref 0.0–1.0)
pH: 7 (ref 5.0–8.0)

## 2023-09-28 LAB — HEMOGLOBIN A1C: Hgb A1c MFr Bld: 5.9 % (ref 4.6–6.5)

## 2023-09-28 NOTE — Progress Notes (Signed)
 Phone: [phone]   Subjective:  Patient presents today for their annual physical. Chief complaint-noted.   See problem oriented charting- ROS- full  review of systems was completed and negative  Per full ROS sheet completed by patient except for topics noted under acute/chronic concerns  The following were reviewed and entered/updated in epic: Past Medical History:  Diagnosis Date   Allergic rhinitis, cause unspecified    to tick bite - resolved   Arthritis    knees, hands   Balanoposthitis    Mild   Chest pain, unspecified    2007- stress test normal   Constipation 09/24/2009   Prn metamucil  - occasional   Dry eye syndrome    GERD (gastroesophageal reflux disease)    Hearing loss    slight - bilateral - no hearing aids   HEMORRHOID, EXTERNAL, THROMBOSED 05/06/2010   Qualifier: Diagnosis of  By: Rochelle Chu MD, Randa Burton.    History of shingles    Hypertension    Migraine, unspecified, without mention of intractable migraine without mention of status migrainosus    Severe sleep apnea 07/07/2022   Sleep apnea ??/????   TINNITUS NOS 01/31/2008   Qualifier: Diagnosis of  By: Georgia Kipper MD, Oakley Bellman    Patient Active Problem List   Diagnosis Date Noted   Agatston CAC score, >400 07/26/2022    Priority: High   Allergy to beef 02/17/2016    Priority: High   Tobacco abuse 11/02/2011    Priority: High   Aortic atherosclerosis (HCC) 07/26/2022    Priority: Medium    Hyperglycemia 03/17/2022    Priority: Medium    Hyperlipidemia, unspecified 09/30/2020    Priority: Medium    Insomnia 11/12/2012    Priority: Medium    Hypertension 11/12/2012    Priority: Medium    Migraine 09/30/2007    Priority: Medium    Osteoarthritis 07/22/2014    Priority: Low   History of colonic polyps 07/22/2014    Priority: Low   Urinary frequency 09/11/2011    Priority: Low   LOW BACK PAIN SYNDROME 09/24/2009    Priority: Low   Dry eyes 09/30/2007    Priority: Low   Allergic rhinitis  09/30/2007    Priority: Low   History of shingles 09/30/2007    Priority: Low   OSA (obstructive sleep apnea) 07/07/2022   Loud snoring 04/05/2022   Hemorrhoid 10/30/2014   Tinnitus, bilateral 01/31/2008   Past Surgical History:  Procedure Laterality Date   COLONOSCOPY  05/16/2011    hx polyps    FOOT FRACTURE SURGERY  05/16/1991   right-crushed all 4 toes reportedly   FRACTURE SURGERY  10/93   KNEE ARTHROSCOPY  05/15/2009   Right for Condromalacia   ORIF FEMUR FRACTURE  05/16/1991   Right   POLYPECTOMY     WISDOM TOOTH EXTRACTION      Family History  Problem Relation Age of Onset   Diabetes type II Mother    Heart disease Mother        CABG-died during this, smoker   Hyperlipidemia Mother    Hypertension Mother    Diabetes Father    Heart disease Father        thought death due to this, smoker   Hypertension Father    Schizophrenia Father    Colon polyps Sister    Colon cancer Neg Hx    Stomach cancer Neg Hx    Esophageal cancer Neg Hx    Rectal cancer Neg Hx  Crohn's disease Neg Hx     Medications- reviewed and updated Current Outpatient Medications  Medication Sig Dispense Refill   amLODipine  (NORVASC ) 10 MG tablet TAKE 1 TABLET BY MOUTH EVERY DAY 90 tablet 3   atorvastatin  (LIPITOR) 20 MG tablet Take 1 tablet (20 mg total) by mouth daily. 90 tablet 3   betamethasone dipropionate 0.05 % cream PRN     diclofenac (VOLTAREN) 75 MG EC tablet DAILY     diphenhydrAMINE  (BENADRYL ) 25 MG tablet Take 25 mg by mouth every 6 (six) hours as needed for itching.     hydrocortisone  2.5 % cream PLEASE SEE ATTACHED FOR DETAILED DIRECTIONS     metoprolol  succinate (TOPROL -XL) 50 MG 24 hr tablet TAKE 2 TABLETS BY MOUTH EVERY DAY 180 tablet 3   metroNIDAZOLE (METROGEL) 0.75 % gel Apply topically daily. PRN     Multiple Vitamin (MULTI VITAMIN PO) Take by mouth.     pramoxine-hydrocortisone  (ANALPRAM  HC) cream Apply topically in the morning and at bedtime. (Patient taking  differently: Apply topically in the morning and at bedtime. PRN) 57 g 3   SUMAtriptan  (IMITREX ) 50 MG tablet TAKE 1 TABLET BY MOUTH AS NEEDED MIGRAINE 6 tablet 10   tadalafil  (CIALIS ) 10 MG tablet Take 1 tablet (10 mg total) by mouth every 3 (three) days as needed for erectile dysfunction. 10 tablet 3   topiramate  (TOPAMAX ) 25 MG tablet TAKE 1 TABLET (25 MG TOTAL) BY MOUTH DAILY. 30 tablet 3   triamcinolone  cream (KENALOG ) 0.5 % Apply 1 application topically 2 (two) times daily. For 10 days to affected area (Patient taking differently: Apply 1 application  topically 2 (two) times daily. PRN) 30 g 0   No current facility-administered medications for this visit.    Allergies-reviewed and updated Allergies  Allergen Reactions   Codeine Nausea Only    Social History   Social History Narrative   Married-'81. 1 dtr - '87, 1 son - '83, 2 granddaughters   Marriage in good health.       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       Hobbies: time with grandkids, work with cows, minimal travel due to pain   Are you right handed or left handed? right   Are you currently employed ?    What is your current occupation?   Do you live at home alone?no   Who lives with you? wife   What type of home do you live in: 1 story or 2 story? two    Caffeine 1 cup daily   Objective  Objective:  BP 128/72   Pulse 65   Temp (!) 97.3 F (36.3 C)   Ht 5\' 6"  (1.676 m)   Wt 169 lb 6.4 oz (76.8 kg)   SpO2 98%   BMI 27.34 kg/m  Gen: NAD, resting comfortably HEENT: Mucous membranes are moist. Oropharynx normal Neck: no thyromegaly CV: RRR no murmurs rubs or gallops Lungs: CTAB no crackles, wheeze, rhonchi Abdomen: soft/nontender/nondistended/normal bowel sounds. No rebound or guarding.  Ext: no edema Skin: warm, dry Neuro: grossly normal,  moves all extremities, PERRLA   Assessment and Plan  63 y.o. male presenting for annual physical.  Health Maintenance counseling: 1. Anticipatory guidance: Patient counseled regarding regular dental exams -q6 months, eye exams -yearly,  avoiding smoking and second  hand smoke , limiting alcohol to 2 beverages per day - 2-3 per day- encouraged max 2 per day or even lower, no illicit drugs .   2. Risk factor reduction:  Advised patient of need for regular exercise and diet rich and fruits and vegetables to reduce risk of heart attack and stroke.  Exercise- walking has dropped down due to back and knees - goal 150 minutes a week but hard with orthopedic issues.  Diet/weight management-stable from last year- continue to monitor - not eating quite as well as last year- feels could reduce bread.  Wt Readings from Last 3 Encounters:  09/28/23 169 lb 6.4 oz (76.8 kg)  07/24/23 170 lb (77.1 kg)  06/20/23 166 lb (75.3 kg)  3. Immunizations/screenings/ancillary studies- opts out shingrix and COVID. Prefers to no longer do covid Immunization History  Administered Date(s) Administered   Influenza,inj,Quad PF,6+ Mos 02/17/2016, 03/21/2017, 03/01/2018, 03/19/2019, 03/04/2020   Influenza,inj,quad, With Preservative 02/28/2018   Influenza-Unspecified 03/29/2014, 02/28/2018   Tdap 11/12/2013  4. Prostate cancer screening- low risk prior trend- update psa today  . Nocturia once a night Lab Results  Component Value Date   PSA 0.22 09/25/2022   PSA 0.17 09/15/2021   PSA 0.23 09/30/2020   5. Colon cancer screening - History of adenomatous colon polyp 2013-subsequent colonoscopies most recently June 2023 reassuring with Dr. Delvin File 10-year recall now  6. Skin cancer screening- Dr. Rochelle Chu yearly. advised regular sunscreen use. Denies worrisome, changing, or new skin lesions.  7. Smoking associated screening (lung cancer screening, AAA screen 65-75, UA)- former smoker- will get UA- quit 2015.  8. STD screening -  only active with your wife  Status of chronic or acute concerns   #Hypertension S: Compliant with amlodipine  10 mg, metoprolol  100 mg extended release A/P: stable- continue current medicines     #Hyperlipidemia-normal exercise tolerance test 03/24/2021.   S: Medication: atorvastatin  20 mg daily -CT calcium  at 400 march 2024. No chest pain or shortness of breath  Lab Results  Component Value Date   CHOL 135 03/29/2023   HDL 48.20 03/29/2023   LDLCALC 57 03/29/2023   LDLDIRECT 118.0 09/25/2022   TRIG 151.0 (H) 03/29/2023   CHOLHDL 3 03/29/2023  A/P: lipids at goal even for coronary artery calcium  .  Mentioned scheduling cardiology follow up- he wants to hold off for now  # Alpha gal S:contracted around 2017 we believe. Alpha gal labs have improved but not completely normalized.   - still eating red meat  -thankfully no major issues A/P: advised not to eat red meat and will check alpha gal panel again- thankfully no issues. Declines epi pen .   #Migraines- seeing Dr. Genita Keys S: preventative: topamax  25 mg  - Compliant with sumatriptan -uses sparingly after topamax  A/P: needs to reschedule with neurology- interested in trying alternate preventative   #Osteoarthritis- Left knee, neck.  -Celebrex, meloxicam  in past  -diclofenac currently - helpful  - Needs q6 month BMP on nsaids   # Low back pain in 2025-has worked with [facility] in early April with degenerative disc disease of lumbar spine-had been treated with diclofenac but later transitioned to prednisoneand advised physical therapy- did 2 sessions- radicular pain did improve but still having back pain that comes and goes . Did go back to diclofenac afterwards  # Hyperglycemia/insulin resistance/prediabetes- a1c 5.19 Sep 2021 S:  Medication: none but did have steroids recently which could worsen    Lab Results  Component Value Date   HGBA1C 5.6 03/29/2023  HGBA1C 5.7 09/25/2022   HGBA1C 6.1 03/17/2022   A/P: hopefully  stable- update a1c today. Continue without meds for now   #Vitamin D  deficiency- vitamin D  21 in 2024 S: Medication: 1000 units a day Last vitamin D  Lab Results  Component Value Date   VD25OH 61.45 03/29/2023  A/P: stable- continue current medicines    # B12 deficiency- 241 in 2024 S: Current treatment/medication (oral vs. IM): thinks he ran out of b12         Lab Results  Component Value Date   VITAMINB12 700 03/29/2023  A/P: out of B12 - will check levels   #OSA - managed by pulmonary but doesn't tolerate CPAP  #Tobacco abuse- still dips- former smoker- check UA- encouraged cessatoin   #Gerd- sparing pepcid  very helpful maybe 4x a week- no reent issues   Recommended follow up: Return in about 6 months (around 03/30/2024) for followup or sooner if needed.Schedule b4 you leave. Future Appointments  Date Time Provider Department Center  11/09/2023  9:00 AM Ellene Gustin, MD [facility] None  01/29/2024  9:30 AM Antonio Baumgarten, NP [facility] None   Lab/Order associations: fasting- creamer and sugar but otherwise no food   ICD-10-CM   1. Preventative health care  Z00.00     2. Screening for prostate cancer  Z12.5     3. Hyperglycemia  R73.9     4. Allergy to beef  Z91.014     5. Hyperlipidemia, unspecified hyperlipidemia type  E78.5     6. Primary hypertension  I10     7. Screening for diabetes mellitus  Z13.1     8. B12 deficiency  E53.8     9. Vitamin D  deficiency  E55.9       No orders of the defined types were placed in this encounter.   Return precautions advised.  Clarisa Crooked, MD

## 2023-09-28 NOTE — Patient Instructions (Addendum)
 Please stop by lab before you go If you have mychart- we will send your results within 3 business days of us  receiving them.  If you do not have mychart- we will call you about results within 5 business days of us  receiving them.  *please also note that you will see labs on mychart as soon as they post. I will later go in and write notes on them- will say "notes from Dr. Arlene Ben"   Encourage cessation form dip   Recommended follow up: Return in about 6 months (around 03/30/2024) for followup or sooner if needed.Schedule b4 you leave.

## 2023-10-02 LAB — VITAMIN B12: Vitamin B-12: 295 pg/mL (ref 211–911)

## 2023-10-02 LAB — PSA: PSA: 0.17 ng/mL (ref 0.10–4.00)

## 2023-10-03 LAB — ALPHA-GAL PANEL
Allergen, Mutton, f88: 0.32 kU/L — ABNORMAL HIGH
Allergen, Pork, f26: 0.12 kU/L — ABNORMAL HIGH
Beef: 0.48 kU/L — ABNORMAL HIGH
CLASS: 1
GALACTOSE-ALPHA-1,3-GALACTOSE IGE*: 4.02 kU/L — ABNORMAL HIGH (ref <0.10)

## 2023-10-03 LAB — INTERPRETATION:

## 2023-10-03 NOTE — Telephone Encounter (Addendum)
 Called pt to schedule overdue follow up appointment- no answer, left message to call back to get scheduled ( will likely need July visit with APP at [facility])    ----- Message from Clearnce Curia sent at 10/03/2023  5:06 PM EDT ----- Thank you for the update! We will get his overdue cardiology follow up scheduled and can recheck liver enzymes at that time.  Lisa Rideau - will you help me coordinate f/u with Dr. Alroy Aspen or Moira Andrews for overdue follow up and put 'consider LFTs' in appt notes? TY! ----- Message ----- From: Almira Jaeger, MD Sent: 10/02/2023   5:44 PM EDT To: Gerldine Koch, NP; Gearline Kell  Notes from Dr. Arlene Ben:   Your CMET was largely normal (kidney, liver, and electrolytes, blood sugar).  Your liver function test have mildly worsened-this tends to be more either fatty liver or possibly medication related-if this worsens any further we may need to trial off of the atorvastatin  or at least adjust the dose in the future but will continue for now-will let cardiology know as an FYI but not asking them to change right now  Your CBC was largely normal (blood counts, infection fighting cells, platelets).  Albumin is a protein product is mildly high-definitely recheck next visit Your cholesterol looks excellent-continue current medication You are at risk for diabetes with hemoglobin a1c of 5.9 up from 5.6 (at risk from 5.7-6.4). Healthy eating, regular exercise efforts are recommended. Your PSA trend was low risk for prostate cancer. B12 is low normal.  I prefer for this level to be over 400 if possible.  Reasonable to take 1000 mcg of B12/cyanocobalamin  over-the-counter daily for a month and then at least weekly (you can continue daily if you prefer as you simply pee out the extra) Alpha gal still pending

## 2023-10-05 NOTE — Telephone Encounter (Signed)
 2nd call attempt- no answer, left message to call back.

## 2023-10-19 ENCOUNTER — Encounter (HOSPITAL_BASED_OUTPATIENT_CLINIC_OR_DEPARTMENT_OTHER): Payer: Self-pay

## 2023-10-27 ENCOUNTER — Other Ambulatory Visit: Payer: Self-pay | Admitting: Family Medicine

## 2023-10-30 ENCOUNTER — Other Ambulatory Visit: Payer: Self-pay | Admitting: Neurology

## 2023-10-30 DIAGNOSIS — G43E09 Chronic migraine with aura, not intractable, without status migrainosus: Secondary | ICD-10-CM

## 2023-10-30 NOTE — Progress Notes (Signed)
 NEUROLOGY FOLLOW UP OFFICE NOTE  [name] [id number]  Subjective:  Jordan Cantrell is a 63 y.o. year old right-handed male with a medical history of severe OSA, HTN, HLD, insomnia, OA, former smoker who we last saw on 12/14/22 for headaches.  To briefly review: 09/28/22: Patient has migraines since at least the age of 61. The pain was previously so bad that he would seek treatment in the ED. Things improved over the last 1-2 decades though. The pain is on the right side of his face starting behind the eye. It is a soreness feeling. The pain is rated 2-3/10. He endorses photophobia, phonophobia, nausea, but no vomiting. When he gets a headache, he will go home and rest in a dark room with no sound. The symptoms will decrease and resolve in 30-45 minutes. He has 2-3 headaches per week. He mentions he may not ever be completely symptom free.    He endorses aura that prevents function. Previously this was tingling in his hands. Currently it is a colorful lightening bolt, semicircular images, and blurry vision in the peripheral vision. He will take sumatriptan  if his aura will not go away, but not with every headache.   Headaches do not wake him up from sleep. He does not have them when first waking, until he started CPAP.   Lately he feels the frequency of his migraines have increased (over the last few weeks). It may have increased with recent start of CPAP. As a result, he has stopped CPAP. Patient was also started on atorvastatin  but stopped this a couple of weeks ago due to this being a new medication and wondering if this was contributing. He was previously taking sudafed every day due to congestion. He is now doing netty pot every day.   Patient has changed his diet significantly due to some CAD. He has not seen a difference in his headaches since change in diet which was a few months ago.   He feels like bright lights are a trigger. He denies worsening with position (laying or  standing) or with straining. He may have some tearing in the left eye, but no running nose, ptosis, facial pallor/flushing/sweating, or aural fullness.   Family history of headaches: No   Social history: Caffiene use: 1-2 cups of coffee every morning Tobacco use: dip tobacco EtOH use: 1-2 mixed drinks per day Drug use: No Sleep: Unclear - patient has difficulty assessing Mood: Generally okay, but can be down when he has a lot of migraines   Current headache medications (including over the counter): Sumatriptan  - gets 6 per month, usually does not use them all - generally helps with headaches/aura Diclofenac for OA (1 every day)   Metoprolol  100 mg daily (started for migraine prevention decades ago, but also for HTN) Magnesium 420 mg - just started this week   Previous medications tried (effectiveness, side effects, why stopped):  None    At the end of the appointment, patient also pulled out a list of other complaints including leg twitches/cramps, and tinnitus. We did not fully discuss today.   12/14/22: B12 was borderline low. I recommended supplementing with B12 1000 mcg daily. Patient starting taking a B supplement (not sure which), but did not like how it agreed with him. He will look into this again.   CT head showed no significant brain abnormalities. Small fluid level in the right maxillary sinus and mucosal thickening in the sinuses were noted.   Patient is taking Topamax  25  mg daily. He has not had any headaches since starting the medication. He has not had to use Sumatriptan .   Patient has not yet seen ophtho, but is scheduled in the next couple of months.   At last visit, patient mentioned leg twitching/cramps. The twitching has improved. Every once in a while, he will get woken up with cramps. He denies any numbness or tingling in legs.   Patient also has chronic tinnitus. It seems to be getting worse over time. He used to work at [employer] and has been told he has  hearing loss in the past.  Most recent Assessment and Plan (12/14/22): This is Jordan Cantrell, a 63 y.o. male with: Migraine with aura - Previously was having ~3 headaches per week, but currently having none since starting Topamax . Patient was prescribed metoprolol  100 mg daily for migraines in the past (also for cardiac reasons), but this did not help with headaches B12 deficiency Tinnitus - likely secondary to hearing loss Muscle cramps - occasional, at night. No associated numbness, tingling, or weakness of legs   Plan: -B12 1000 mcg daily or talk to PCP if he prefers injection -Gave OTC recommendations for cramps -Discussed audiology referral, but patient will defer for now -For migraines: Migraine prevention:  Continue Topamax  25 mg daily Migraine rescue:  Continue Sumatriptan  50 mg PRN at headache onset, may repeat dose after 2 hours if needed, but only once Limit use of pain relievers to no more than 2 days out of week to prevent risk of rebound or medication-overuse headache. Keep headache diary  Since their last visit: Patient saw [facility] on 02/08/23 with a normal evaluation (notes under media).  Patient has had about 3 headaches since 12/14/22. He is taking topamax  25 mg daily. When he gets a headache sumatriptan  will work for his headaches. The last headache he had was when he was taking the medication every other day waiting on a refill.  Patient is concerned because he feels like he is apathetic about things. He thinks this started in the last year. He denies feeling particularly down or depressed. He just doesn't want to do things like mow the yard or do projects. He denies not wanting to do things he enjoys. He wonders if this is related to topamax . He is reluctant to change topamax  due to how good his headaches have been but wonders if he should.   Patient does not sleep well. He has OSA and has stopped treating that (previously on CPAP but not tolerating). He  also mentions tick bites that really itch. When he sleeps better, he does think his energy is better.  EtOH use: couple of drinks per night.   B12 is still borderline low. He is taking a B12 supplement.  MEDICATIONS:  Outpatient Encounter Medications as of 11/09/2023  Medication Sig   amLODipine  (NORVASC ) 10 MG tablet TAKE 1 TABLET BY MOUTH EVERY DAY   atorvastatin  (LIPITOR) 20 MG tablet Take 1 tablet (20 mg total) by mouth daily.   betamethasone dipropionate 0.05 % cream PRN   diclofenac (VOLTAREN) 75 MG EC tablet DAILY   diphenhydrAMINE  (BENADRYL ) 25 MG tablet Take 25 mg by mouth every 6 (six) hours as needed for itching.   hydrocortisone  2.5 % cream PLEASE SEE ATTACHED FOR DETAILED DIRECTIONS   metoprolol  succinate (TOPROL -XL) 50 MG 24 hr tablet TAKE 2 TABLETS BY MOUTH EVERY DAY   metroNIDAZOLE (METROGEL) 0.75 % gel Apply topically daily. PRN   Multiple Vitamin (MULTI VITAMIN PO)  Take by mouth.   pramoxine-hydrocortisone  (ANALPRAM  HC) cream Apply topically in the morning and at bedtime. (Patient taking differently: Apply topically in the morning and at bedtime. PRN)   tadalafil  (CIALIS ) 10 MG tablet Take 1 tablet (10 mg total) by mouth every 3 (three) days as needed for erectile dysfunction.   topiramate  (TOPAMAX ) 25 MG tablet TAKE 1 TABLET (25 MG TOTAL) BY MOUTH DAILY.   triamcinolone  cream (KENALOG ) 0.5 % Apply 1 application topically 2 (two) times daily. For 10 days to affected area   SUMAtriptan  (IMITREX ) 50 MG tablet TAKE 1 TABLET BY MOUTH AS NEEDED MIGRAINE   [DISCONTINUED] amLODipine  (NORVASC ) 10 MG tablet TAKE 1 TABLET BY MOUTH EVERY DAY   [DISCONTINUED] topiramate  (TOPAMAX ) 25 MG tablet TAKE 1 TABLET (25 MG TOTAL) BY MOUTH DAILY.   No facility-administered encounter medications on file as of 11/09/2023.    PAST MEDICAL HISTORY: Past Medical History:  Diagnosis Date   Allergic rhinitis, cause unspecified    to tick bite - resolved   Arthritis    knees, hands    Balanoposthitis    Mild   Chest pain, unspecified    2007- stress test normal   Constipation 09/24/2009   Prn metamucil  - occasional   Dry eye syndrome    GERD (gastroesophageal reflux disease)    Hearing loss    slight - bilateral - no hearing aids   HEMORRHOID, EXTERNAL, THROMBOSED 05/06/2010   Qualifier: Diagnosis of  By: Joshua MD, Debby CROME.    History of shingles    Hypertension    Migraine, unspecified, without mention of intractable migraine without mention of status migrainosus    Severe sleep apnea 07/07/2022   Sleep apnea ??/????   TINNITUS NOS 01/31/2008   Qualifier: Diagnosis of  By: [employer] MD, Karlynn GAILS     PAST SURGICAL HISTORY: Past Surgical History:  Procedure Laterality Date   COLONOSCOPY  05/16/2011    hx polyps    FOOT FRACTURE SURGERY  05/16/1991   right-crushed all 4 toes reportedly   FRACTURE SURGERY  10/93   KNEE ARTHROSCOPY  05/15/2009   Right for Condromalacia   ORIF FEMUR FRACTURE  05/16/1991   Right   POLYPECTOMY     WISDOM TOOTH EXTRACTION      ALLERGIES: Allergies  Allergen Reactions   Codeine Nausea Only    FAMILY HISTORY: Family History  Problem Relation Age of Onset   Diabetes type II Mother    Heart disease Mother        CABG-died during this, smoker   Hyperlipidemia Mother    Hypertension Mother    Diabetes Father    Heart disease Father        thought death due to this, smoker   Hypertension Father    Schizophrenia Father    Colon polyps Sister    Colon cancer Neg Hx    Stomach cancer Neg Hx    Esophageal cancer Neg Hx    Rectal cancer Neg Hx    Crohn's disease Neg Hx     SOCIAL HISTORY: Social History   Tobacco Use   Smoking status: Former    Current packs/day: 0.00    Types: Cigarettes    Quit date: 04/28/2014    Years since quitting: 9.5    Passive exposure: Past (PARENTS SMOKED)   Smokeless tobacco: Current    Types: Snuff   Tobacco comments:    Reported Dipping daily  Vaping Use   Vaping  status: Never Used  Substance Use Topics   Alcohol use: Yes    Alcohol/week: 25.0 - 27.0 standard drinks of alcohol    Types: 10 - 12 Cans of beer, 15 Shots of liquor per week    Comment: beer/liquor   Drug use: Never   Social History   Social History Narrative   Married-'81. 1 dtr - '87, 1 son - '83, 2 granddaughters   Marriage in good health.       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       Hobbies: time with grandkids, work with cows, minimal travel due to pain   Are you right handed or left handed? right   Are you currently employed ?    What is your current occupation?   Do you live at home alone?no   Who lives with you? wife   What type of home do you live in: 1 story or 2 story? two    Caffeine 1 cup daily      Objective:  Vital Signs:  BP 129/75   Pulse 68   Ht 5' 6 (1.676 m)   Wt 167 lb (75.8 kg)   SpO2 98%   BMI 26.95 kg/m   General: No acute distress.  Patient appears well-groomed.   Head:  Normocephalic/atraumatic Eyes:  Fundi examined but not visualized Neck: supple Heart:  Regular rate and rhythm Lungs:  Clear to auscultation bilaterally Neurological Exam: alert and oriented.  Speech fluent and not dysarthric, language intact.  CN II-XII intact. Bulk and tone normal, muscle strength 5/5 throughout.  Sensation to light touch intact.  Deep tendon reflexes 2+ throughout, toes downgoing.  Finger to nose testing intact.  Gait normal.  Labs and Imaging review: New results: 09/28/23: B12: 295 HbA1c: 5.9 Lipid panel: tChol 139, LDL 43, TG 209   Previously reviewed results: B12 (09/28/22): 241            Lab Results  Component Value Date    HGBA1C 5.7 09/25/2022               Lab Results  Component Value Date    TSH 1.63 09/15/2021      CBC (03/17/22) unremarkable CMP (03/17/22)  unremarkable   CT head wo contrast (11/06/22): FINDINGS: Brain: No evidence of acute infarction, hemorrhage, hydrocephalus, extra-axial collection or mass lesion/mass effect.   Vascular: No hyperdense vessels.  No unexpected calcification   Skull: Normal. Negative for fracture or focal lesion.   Sinuses/Orbits: Small fluid level right maxillary sinus. Mucosal thickening in the sinuses   Other: None   IMPRESSION: 1. Negative non contrasted CT appearance of the brain 2. Maxillary sinusitis   Assessment/Plan:  This is MOUNIR SKIPPER, a 63 y.o. male with: Migraine with aura - currently very well controlled with Topamax  25 mg daily with 3 headaches in the last 10 months, all which respond to sumatriptan . Patient has apathy over the last year that he wonders may be related to Topamax , so is considering whether to change (see #6 below). Poor sleep - ?OSA that is currently untreated B12 deficiency - still borderline low though patient states he is taking it EtOH use - at least 2 drinks per night, could contribute to poor sleep Tick bites - will check Lyme as this can contribute to fatigue Apathy - unclear  if this is related to Topamax  or related to poor sleep, vitamin deficiency, or depressive like symptom. I offered to switch topamax  to nortriptyline, but patient would like to wait for now as topamax  is helping so much with migraines.  Plan: -Blood work: B1, folate, MMA, Lyme, vit D -B12 1000 mcg daily -Continue vitamin D  supplement -Discussed dangers of not treating OSA. Patient to follow up with sleep medicine if he would like to treat -For migraines: Migraine prevention:  Continue topamax  25 mg at bedtime. May consider switching to nortriptyline but patient would like to think about it. Migraine rescue:  Continue sumatriptan  50 mg as needed at headache onset, can repeat after 2 hours if needed Limit use of pain relievers to no more than 2 days out of week to prevent risk of rebound  or medication-overuse headache. Keep headache diary   Return to clinic in 6 months  Total time spent reviewing records, interview, history/exam, documentation, and coordination of care on day of encounter:  45 min  Venetia Potters, MD

## 2023-11-09 ENCOUNTER — Encounter: Payer: Self-pay | Admitting: Neurology

## 2023-11-09 ENCOUNTER — Other Ambulatory Visit

## 2023-11-09 ENCOUNTER — Ambulatory Visit: Payer: 59 | Admitting: Neurology

## 2023-11-09 VITALS — BP 129/75 | HR 68 | Ht 66.0 in | Wt 167.0 lb

## 2023-11-09 DIAGNOSIS — E538 Deficiency of other specified B group vitamins: Secondary | ICD-10-CM

## 2023-11-09 DIAGNOSIS — G43E09 Chronic migraine with aura, not intractable, without status migrainosus: Secondary | ICD-10-CM

## 2023-11-09 DIAGNOSIS — G4733 Obstructive sleep apnea (adult) (pediatric): Secondary | ICD-10-CM | POA: Diagnosis not present

## 2023-11-09 DIAGNOSIS — W57XXXA Bitten or stung by nonvenomous insect and other nonvenomous arthropods, initial encounter: Secondary | ICD-10-CM

## 2023-11-09 DIAGNOSIS — E559 Vitamin D deficiency, unspecified: Secondary | ICD-10-CM | POA: Diagnosis not present

## 2023-11-09 NOTE — Addendum Note (Signed)
 Addended by: TERRIL CHARLIES MATSU on: 11/09/2023 10:36 AM   Modules accepted: Orders

## 2023-11-09 NOTE — Addendum Note (Signed)
 Addended by: OZELL JESUSA PARAS on: 11/09/2023 10:54 AM   Modules accepted: Orders

## 2023-11-09 NOTE — Patient Instructions (Addendum)
 We discussed changing topamax  today due to you not feeling like doing a lot since starting it. This could be due to topamax  but could also be due to poor sleep (untreated sleep apnea), vitamin deficiency, or even unrelated developing depression like symptoms. You decided to stay on topamax  for now as it is helping with your headaches, but let me know if you change your mind.  I will also check lab work today. I will be in touch when I have those results.  Your B12 was borderline low. Given your symptoms, I would recommend supplementing with B12 1000 mcg daily. This can be bought over the counter at any local drug store or online.  Continue vitamin D  supplementation.  I will follow up with you in about 6 months. Please let me know if you have any questions or concerns in the meantime.  The physicians and staff at [facility] are committed to providing excellent care. You may receive a survey requesting feedback about your experience at our office. We strive to receive very good responses to the survey questions. If you feel that your experience would prevent you from giving the office a very good  response, please contact our office to try to remedy the situation. We may be reached at [phone]. Thank you for taking the time out of your busy day to complete the survey.  Venetia Potters, MD [facility]  More migraine information: Be aware of common food triggers:  - Caffeine:  coffee, black tea, cola, Mt. Dew  - Chocolate  - Dairy:  aged cheeses (brie, blue, cheddar, gouda, Parmasan, provolone, romano, Swiss, etc), chocolate milk, buttermilk, sour cream, limit eggs and yogurt  - Nuts, peanut butter  - Alcohol  - Cereals/grains:  FRESH breads (fresh bagels, sourdough, doughnuts), yeast productions  - Processed/canned/aged/cured meats (pre-packaged deli meats, hotdogs)  - MSG/glutamate:  soy sauce, flavor enhancer, pickled/preserved/marinated foods  - Sweeteners:  aspartame  (Equal, Nutrasweet).  Sugar and Splenda are okay  - Vegetables:  legumes (lima beans, lentils, snow peas, fava beans, pinto peans, peas, garbanzo beans), sauerkraut, onions, olives, pickles  - Fruit:  avocados, bananas, citrus fruit (orange, lemon, grapefruit), mango  - Other:  Frozen meals, macaroni and cheese Routine exercise Stay adequately hydrated (aim for 64 oz water daily) Keep headache diary Maintain proper stress management Maintain proper sleep hygiene Do not skip meals Consider supplements:  magnesium citrate 400mg  daily, riboflavin 400mg  daily, coenzyme Q10 100mg  three times daily.

## 2023-11-13 ENCOUNTER — Ambulatory Visit: Payer: Self-pay | Admitting: Neurology

## 2023-11-13 LAB — LYME DISEASE ANTIBODY (IGG), IMMUNOBLOT
B burgdorferi IgG Abs (IB): NEGATIVE
Lyme Disease 18 kD IgG: NONREACTIVE
Lyme Disease 23 kD IgG: NONREACTIVE
Lyme Disease 28 kD IgG: NONREACTIVE
Lyme Disease 30 kD IgG: NONREACTIVE
Lyme Disease 39 kD IgG: NONREACTIVE
Lyme Disease 41 kD IgG: REACTIVE — AB
Lyme Disease 45 kD IgG: NONREACTIVE
Lyme Disease 58 kD IgG: NONREACTIVE
Lyme Disease 66 kD IgG: NONREACTIVE
Lyme Disease 93 kD IgG: NONREACTIVE

## 2023-11-13 LAB — VITAMIN D 25 HYDROXY (VIT D DEFICIENCY, FRACTURES): Vit D, 25-Hydroxy: 37 ng/mL (ref 30–100)

## 2023-11-13 LAB — METHYLMALONIC ACID, SERUM: Methylmalonic Acid, Quant: 118 nmol/L (ref 69–390)

## 2023-11-13 LAB — VITAMIN B1: Vitamin B1 (Thiamine): 14 nmol/L (ref 8–30)

## 2023-11-13 LAB — FOLATE: Folate: 12.9 ng/mL

## 2024-01-15 ENCOUNTER — Encounter: Payer: Self-pay | Admitting: Internal Medicine

## 2024-01-29 ENCOUNTER — Ambulatory Visit: Admitting: Primary Care

## 2024-02-03 ENCOUNTER — Other Ambulatory Visit: Payer: Self-pay | Admitting: Family Medicine

## 2024-02-04 ENCOUNTER — Other Ambulatory Visit: Payer: Self-pay | Admitting: Nurse Practitioner

## 2024-02-24 ENCOUNTER — Other Ambulatory Visit: Payer: Self-pay | Admitting: Neurology

## 2024-02-24 DIAGNOSIS — G43E09 Chronic migraine with aura, not intractable, without status migrainosus: Secondary | ICD-10-CM

## 2024-04-04 ENCOUNTER — Ambulatory Visit: Payer: Self-pay | Admitting: Family Medicine

## 2024-04-04 ENCOUNTER — Encounter: Payer: Self-pay | Admitting: Family Medicine

## 2024-04-04 ENCOUNTER — Ambulatory Visit: Admitting: Family Medicine

## 2024-04-04 VITALS — BP 132/68 | HR 71 | Temp 97.9°F | Ht 66.0 in | Wt 168.0 lb

## 2024-04-04 DIAGNOSIS — R931 Abnormal findings on diagnostic imaging of heart and coronary circulation: Secondary | ICD-10-CM

## 2024-04-04 DIAGNOSIS — Z91014 Allergy to mammalian meats: Secondary | ICD-10-CM | POA: Diagnosis not present

## 2024-04-04 DIAGNOSIS — Z125 Encounter for screening for malignant neoplasm of prostate: Secondary | ICD-10-CM

## 2024-04-04 DIAGNOSIS — Z131 Encounter for screening for diabetes mellitus: Secondary | ICD-10-CM

## 2024-04-04 DIAGNOSIS — I10 Essential (primary) hypertension: Secondary | ICD-10-CM | POA: Diagnosis not present

## 2024-04-04 DIAGNOSIS — E785 Hyperlipidemia, unspecified: Secondary | ICD-10-CM | POA: Diagnosis not present

## 2024-04-04 DIAGNOSIS — R739 Hyperglycemia, unspecified: Secondary | ICD-10-CM | POA: Diagnosis not present

## 2024-04-04 DIAGNOSIS — E538 Deficiency of other specified B group vitamins: Secondary | ICD-10-CM | POA: Diagnosis not present

## 2024-04-04 LAB — COMPREHENSIVE METABOLIC PANEL WITH GFR
ALT: 57 U/L — ABNORMAL HIGH (ref 0–53)
AST: 33 U/L (ref 0–37)
Albumin: 4.7 g/dL (ref 3.5–5.2)
Alkaline Phosphatase: 57 U/L (ref 39–117)
BUN: 11 mg/dL (ref 6–23)
CO2: 24 meq/L (ref 19–32)
Calcium: 9.1 mg/dL (ref 8.4–10.5)
Chloride: 108 meq/L (ref 96–112)
Creatinine, Ser: 0.93 mg/dL (ref 0.40–1.50)
GFR: 86.79 mL/min (ref >60.00)
Glucose, Bld: 115 mg/dL — ABNORMAL HIGH (ref 70–99)
Potassium: 3.8 meq/L (ref 3.5–5.1)
Sodium: 140 meq/L (ref 135–145)
Total Bilirubin: 0.8 mg/dL (ref 0.2–1.2)
Total Protein: 7 g/dL (ref 6.0–8.3)

## 2024-04-04 LAB — CBC WITH DIFFERENTIAL/PLATELET
Basophils Absolute: 0 K/uL (ref 0.0–0.1)
Basophils Relative: 0.6 % (ref 0.0–3.0)
Eosinophils Absolute: 0.3 K/uL (ref 0.0–0.7)
Eosinophils Relative: 4.8 % (ref 0.0–5.0)
HCT: 44.2 % (ref 39.0–52.0)
Hemoglobin: 15.1 g/dL (ref 13.0–17.0)
Lymphocytes Relative: 28.7 % (ref 12.0–46.0)
Lymphs Abs: 1.5 K/uL (ref 0.7–4.0)
MCHC: 34.3 g/dL (ref 30.0–36.0)
MCV: 93.2 fl (ref 78.0–100.0)
Monocytes Absolute: 0.6 K/uL (ref 0.1–1.0)
Monocytes Relative: 11.5 % (ref 3.0–12.0)
Neutro Abs: 2.9 K/uL (ref 1.4–7.7)
Neutrophils Relative %: 54.4 % (ref 43.0–77.0)
Platelets: 147 K/uL — ABNORMAL LOW (ref 150.0–400.0)
RBC: 4.74 Mil/uL (ref 4.22–5.81)
RDW: 13.1 % (ref 11.5–15.5)
WBC: 5.3 K/uL (ref 4.0–10.5)

## 2024-04-04 LAB — LDL CHOLESTEROL, DIRECT: Direct LDL: 59 mg/dL

## 2024-04-04 LAB — MICROALBUMIN / CREATININE URINE RATIO
Creatinine,U: 67.6 mg/dL
Microalb Creat Ratio: UNDETERMINED mg/g (ref 0.0–30.0)
Microalb, Ur: <0.7 mg/dL

## 2024-04-04 LAB — HEMOGLOBIN A1C: Hgb A1c MFr Bld: 5.5 % (ref 4.6–6.5)

## 2024-04-04 LAB — VITAMIN B12: Vitamin B-12: 938 pg/mL — ABNORMAL HIGH (ref 211–911)

## 2024-04-04 NOTE — Patient Instructions (Addendum)
-  if you trial 50 mg ER of metoprol instead of 100 mg- let me know how blood pressure does at home- goal would be <135/85   Take vitamin B12/cyanocobalamin  1000 mcg daily  Please stop by lab before you go If you have mychart- we will send your results within 3 business days of us  receiving them.  If you do not have mychart- we will call you about results within 5 business days of us  receiving them.  *please also note that you will see labs on mychart as soon as they post. I will later go in and write notes on them- will say notes from Dr. Katrinka   Recommended follow up: Return in about 6 months (around 10/02/2024) for physical or sooner if needed.Schedule b4 you leave.

## 2024-04-04 NOTE — Progress Notes (Signed)
 Phone: [phone]   Subjective:  Patient presents today for 6 month follow up for hypertension and hyperlipidemia  Chief complaint-noted.   See problem oriented charting-  The following were reviewed and entered/updated in epic: Past Medical History:  Diagnosis Date   Allergic rhinitis, cause unspecified    to tick bite - resolved   Arthritis    knees, hands   Balanoposthitis    Mild   Chest pain, unspecified    2007- stress test normal   Constipation 09/24/2009   Prn metamucil  - occasional   Dry eye syndrome    GERD (gastroesophageal reflux disease)    Hearing loss    slight - bilateral - no hearing aids   HEMORRHOID, EXTERNAL, THROMBOSED 05/06/2010   Qualifier: Diagnosis of  By: Joshua MD, Debby CROME.    History of shingles    Hypertension    Migraine, unspecified, without mention of intractable migraine without mention of status migrainosus    Severe sleep apnea 07/07/2022   Sleep apnea ??/????   TINNITUS NOS 01/31/2008   Qualifier: Diagnosis of  By: Garald MD, Karlynn GAILS    Patient Active Problem List   Diagnosis Date Noted   Agatston CAC score, >400 07/26/2022    Priority: High   Allergy to beef 02/17/2016    Priority: High   Tobacco abuse 11/02/2011    Priority: High   Aortic atherosclerosis 07/26/2022    Priority: Medium    Hyperglycemia 03/17/2022    Priority: Medium    Hyperlipidemia, unspecified 09/30/2020    Priority: Medium    Insomnia 11/12/2012    Priority: Medium    Hypertension 11/12/2012    Priority: Medium    Migraine 09/30/2007    Priority: Medium    Osteoarthritis 07/22/2014    Priority: Low   History of colonic polyps 07/22/2014    Priority: Low   Urinary frequency 09/11/2011    Priority: Low   LOW BACK PAIN SYNDROME 09/24/2009    Priority: Low   Dry eyes 09/30/2007    Priority: Low   Allergic rhinitis 09/30/2007    Priority: Low   History of shingles 09/30/2007    Priority: Low   OSA (obstructive sleep apnea) 07/07/2022    Loud snoring 04/05/2022   Hemorrhoid 10/30/2014   Tinnitus, bilateral 01/31/2008   Past Surgical History:  Procedure Laterality Date   COLONOSCOPY  05/16/2011    hx polyps    FOOT FRACTURE SURGERY  05/16/1991   right-crushed all 4 toes reportedly   FRACTURE SURGERY  10/93   KNEE ARTHROSCOPY  05/15/2009   Right for Condromalacia   ORIF FEMUR FRACTURE  05/16/1991   Right   POLYPECTOMY     WISDOM TOOTH EXTRACTION      Family History  Problem Relation Age of Onset   Diabetes type II Mother    Heart disease Mother        CABG-died during this, smoker   Hyperlipidemia Mother    Hypertension Mother    Diabetes Father    Heart disease Father        thought death due to this, smoker   Hypertension Father    Schizophrenia Father    Colon polyps Sister    Colon cancer Neg Hx    Stomach cancer Neg Hx    Esophageal cancer Neg Hx    Rectal cancer Neg Hx    Crohn's disease Neg Hx     Medications- reviewed and updated Current Outpatient Medications  Medication Sig Dispense Refill  amLODipine  (NORVASC ) 10 MG tablet TAKE 1 TABLET BY MOUTH EVERY DAY 90 tablet 3   atorvastatin  (LIPITOR) 20 MG tablet TAKE 1 TABLET BY MOUTH EVERY DAY 90 tablet 3   betamethasone dipropionate 0.05 % cream PRN     diclofenac (VOLTAREN) 75 MG EC tablet DAILY     diphenhydrAMINE  (BENADRYL ) 25 MG tablet Take 25 mg by mouth every 6 (six) hours as needed for itching.     hydrocortisone  2.5 % cream PLEASE SEE ATTACHED FOR DETAILED DIRECTIONS     metoprolol  succinate (TOPROL -XL) 50 MG 24 hr tablet TAKE 2 TABLETS BY MOUTH EVERY DAY 180 tablet 3   Multiple Vitamin (MULTI VITAMIN PO) Take by mouth.     pramoxine-hydrocortisone  (ANALPRAM  HC) cream Apply topically in the morning and at bedtime. (Patient taking differently: Apply topically in the morning and at bedtime. PRN) 57 g 3   SUMAtriptan  (IMITREX ) 50 MG tablet TAKE 1 TABLET BY MOUTH AS NEEDED MIGRAINE 6 tablet 10   topiramate  (TOPAMAX ) 25 MG tablet TAKE 1  TABLET (25 MG TOTAL) BY MOUTH DAILY. 30 tablet 3   triamcinolone  cream (KENALOG ) 0.5 % Apply 1 application topically 2 (two) times daily. For 10 days to affected area 30 g 0   No current facility-administered medications for this visit.    Allergies-reviewed and updated Allergies  Allergen Reactions   Codeine Nausea Only    Social History   Social History Narrative   Married-'81. 1 dtr - '87, 1 son - '83, 2 granddaughters   Marriage in good health.       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       Hobbies: time with grandkids, work with cows, minimal travel due to pain   Are you right handed or left handed? right   Are you currently employed ?    What is your current occupation?   Do you live at home alone?no   Who lives with you? wife   What type of home do you live in: 1 story or 2 story? two    Caffeine 1 cup daily    Objective  Objective:  BP 132/68 (BP Location: Left Arm, Patient Position: Sitting, Cuff Size: Normal)   Pulse 71   Temp 97.9 F (36.6 C) (Temporal)   Ht 5' 6 (1.676 m)   Wt 168 lb (76.2 kg)   SpO2 96%   BMI 27.12 kg/m  Gen: NAD, resting comfortably CV: RRR no murmurs rubs or gallops Lungs: CTAB no crackles, wheeze, rhonchi Ext: no edema Skin: warm, dry   Assessment and Plan   #not dipping in 2025- down to nicotine replacement- congratulated!   #Hypertension S: Compliant with amlodipine  10 mg, metoprolol  100 mg extended release  BP Readings from Last 3 Encounters:  04/04/24 132/68  11/09/23 129/75  09/28/23 128/72  A/P: reasonable control- continue current medications  -if you trial 50 mg ER of metoprol instead of 100 mg- let me know how blood pressure does at home- goal would be <135/85    #Hyperlipidemia-normal exercise tolerance test 03/24/2021.   S: Medication:  atorvastatin  20 mg daily- doing ok without worsening migraines like higher dose -CT calcium  at 400 march 2024 Lab Results  Component Value Date   CHOL 139 09/28/2023   HDL 54.70 09/28/2023   LDLCALC 43 09/28/2023  LDLDIRECT 118.0 09/25/2022   TRIG 209.0 (H) 09/28/2023   CHOLHDL 3 09/28/2023  A/P: at ideal goal last visit other than triglyceride(s)- continue to work on healthy eating and regular exercise  . LDL under 70 ideal  # Alpha gal S:contracted around 2017 we believe. Alpha gal labs have improved but not completely normalized.  Still occasionally does have cheeseburger with benadryl  - hands itch but no worse than that- tongue tingles a littlle A/P: he wants to update labs-I encouraged avoiding beef, pork etc.  Opts out epi pen but if symptom(s) worsened advised to seek care  #Migraines- seeing Dr. Leigh S: preventative: topamax  25 mg  - Compliant with sumatriptan -uses just once in last 6 months- sparingly after topamax . Still effective when uses A/P: stable- continue current medicines    # Hyperglycemia/insulin resistance/prediabetes S:  Medication: none - not exercising but encouraged him Lab Results  Component Value Date   HGBA1C 5.9 09/28/2023   HGBA1C 5.6 03/29/2023   HGBA1C 5.7 09/25/2022   A/P: hopefully stable- update a1c today. Continue without meds for now   # B12 deficiency- 241 in 2024 S: Current treatment/medication (oral vs. IM): B12 takes orally  A/P: update level today to make sure at least stable to improved   #Vitamin D  deficiency S: Medication: 2000 units Last vitamin D  Lab Results  Component Value Date   VD25OH 37 11/09/2023  A/P: will recheck next visit   #OSA - doesn't tolerate CPAP unfortunately  #Osteoarthritis- Left knee, neck, low back- ongoing issues. [facility] if needed  Recommended follow up: Return in about 6 months (around 10/02/2024) for physical or sooner if needed.Schedule b4 you leave. Future Appointments  Date Time  Provider Department Center  05/23/2024  9:00 AM Leigh Venetia CROME, MD [facility] None  10/03/2024  8:20 AM Katrinka Garnette KIDD, MD [facility]    Lab/Order associations:NOT fasting   ICD-10-CM   1. Tobacco abuse  Z72.0     2. Allergy to beef  Z91.014     3. Agatston CAC score, >400  R93.1     4. Primary hypertension  I10     5. Hyperlipidemia, unspecified hyperlipidemia type  E78.5     6. Hyperglycemia  R73.9     7. Screening for diabetes mellitus  Z13.1     8. Screening for prostate cancer  Z12.5     9. B12 deficiency  E53.8       No orders of the defined types were placed in this encounter.   Return precautions advised.  Garnette Katrinka, MD

## 2024-04-08 LAB — ALPHA-GAL PANEL
Allergen, Mutton, f88: 0.77 kU/L — ABNORMAL HIGH
Allergen, Pork, f26: 0.86 kU/L — ABNORMAL HIGH
Beef: 4.5 kU/L — ABNORMAL HIGH
CLASS: 2
CLASS: 3
Class: 2
GALACTOSE-ALPHA-1,3-GALACTOSE IGE*: 22.2 kU/L — ABNORMAL HIGH (ref <0.10)

## 2024-04-08 LAB — INTERPRETATION:

## 2024-05-13 NOTE — Progress Notes (Signed)
 "  NEUROLOGY FOLLOW UP OFFICE NOTE  Jordan Cantrell [id number]  Subjective:  Jordan Cantrell is a 63 y.o. year old right-handed male with a medical history of severe OSA, HTN, HLD, insomnia, OA, former smoker who we last saw on 11/09/23 for headaches.  To briefly review: 09/28/22: Patient has migraines since at least the age of 42. The pain was previously so bad that he would seek treatment in the ED. Things improved over the last 1-2 decades though. The pain is on the right side of his face starting behind the eye. It is a soreness feeling. The pain is rated 2-3/10. He endorses photophobia, phonophobia, nausea, but no vomiting. When he gets a headache, he will go home and rest in a dark room with no sound. The symptoms will decrease and resolve in 30-45 minutes. He has 2-3 headaches per week. He mentions he may not ever be completely symptom free.    He endorses aura that prevents function. Previously this was tingling in his hands. Currently it is a colorful lightening bolt, semicircular images, and blurry vision in the peripheral vision. He will take sumatriptan  if his aura will not go away, but not with every headache.   Headaches do not wake him up from sleep. He does not have them when first waking, until he started CPAP.   Lately he feels the frequency of his migraines have increased (over the last few weeks). It may have increased with recent start of CPAP. As a result, he has stopped CPAP. Patient was also started on atorvastatin  but stopped this a couple of weeks ago due to this being a new medication and wondering if this was contributing. He was previously taking sudafed every day due to congestion. He is now doing netty pot every day.   Patient has changed his diet significantly due to some CAD. He has not seen a difference in his headaches since change in diet which was a few months ago.   He feels like bright lights are a trigger. He denies worsening with position (laying or  standing) or with straining. He may have some tearing in the left eye, but no running nose, ptosis, facial pallor/flushing/sweating, or aural fullness.   Family history of headaches: No   Social history: Caffiene use: 1-2 cups of coffee every morning Tobacco use: dip tobacco EtOH use: 1-2 mixed drinks per day Drug use: No Sleep: Unclear - patient has difficulty assessing Mood: Generally okay, but can be down when he has a lot of migraines   Current headache medications (including over the counter): Sumatriptan  - gets 6 per month, usually does not use them all - generally helps with headaches/aura Diclofenac for OA (1 every day)   Metoprolol  100 mg daily (started for migraine prevention decades ago, but also for HTN) Magnesium 420 mg - just started this week   Previous medications tried (effectiveness, side effects, why stopped):  None    At the end of the appointment, patient also pulled out a list of other complaints including leg twitches/cramps, and tinnitus. We did not fully discuss today.   12/14/22: B12 was borderline low. I recommended supplementing with B12 1000 mcg daily. Patient starting taking a B supplement (not sure which), but did not like how it agreed with him. He will look into this again.   CT head showed no significant brain abnormalities. Small fluid level in the right maxillary sinus and mucosal thickening in the sinuses were noted.   Patient is taking Topamax   25 mg daily. He has not had any headaches since starting the medication. He has not had to use Sumatriptan .   Patient has not yet seen ophtho, but is scheduled in the next couple of months.   At last visit, patient mentioned leg twitching/cramps. The twitching has improved. Every once in a while, he will get woken up with cramps. He denies any numbness or tingling in legs.   Patient also has chronic tinnitus. It seems to be getting worse over time. He used to work at [employer] and has been told he has  hearing loss in the past.  11/09/23: Patient saw [facility] on 02/08/23 with a normal evaluation (notes under media).   Patient has had about 3 headaches since 12/14/22. He is taking topamax  25 mg daily. When he gets a headache sumatriptan  will work for his headaches. The last headache he had was when he was taking the medication every other day waiting on a refill.   Patient is concerned because he feels like he is apathetic about things. He thinks this started in the last year. He denies feeling particularly down or depressed. He just doesn't want to do things like mow the yard or do projects. He denies not wanting to do things he enjoys. He wonders if this is related to topamax . He is reluctant to change topamax  due to how good his headaches have been but wonders if he should.    Patient does not sleep well. He has OSA and has stopped treating that (previously on CPAP but not tolerating). He also mentions tick bites that really itch. When he sleeps better, he does think his energy is better.   EtOH use: couple of drinks per night.    B12 is still borderline low. He is taking a B12 supplement.  Most recent Assessment and Plan (11/09/23): This is Jordan Cantrell, a 63 y.o. male with: Migraine with aura - currently very well controlled with Topamax  25 mg daily with 3 headaches in the last 10 months, all which respond to sumatriptan . Patient has apathy over the last year that he wonders may be related to Topamax , so is considering whether to change (see #6 below). Poor sleep - ?OSA that is currently untreated B12 deficiency - still borderline low though patient states he is taking it EtOH use - at least 2 drinks per night, could contribute to poor sleep Tick bites - will check Lyme as this can contribute to fatigue Apathy - unclear if this is related to Topamax  or related to poor sleep, vitamin deficiency, or depressive like symptom. I offered to switch topamax  to nortriptyline, but  patient would like to wait for now as topamax  is helping so much with migraines.   Plan: -Blood work: B1, folate, MMA, Lyme, vit D -B12 1000 mcg daily -Continue vitamin D  supplement -Discussed dangers of not treating OSA. Patient to follow up with sleep medicine if he would like to treat -For migraines: Migraine prevention:  Continue topamax  25 mg at bedtime. May consider switching to nortriptyline but patient would like to think about it. Migraine rescue:  Continue sumatriptan  50 mg as needed at headache onset, can repeat after 2 hours if needed Limit use of pain relievers to no more than 2 days out of week to prevent risk of rebound or medication-overuse headache. Keep headache diary  Since their last visit: Labs were normal.  In terms of headaches, patient is doing well. He does not remember significant headaches since our last visit.  He continues to take topamax  25 mg daily. He has not taken sumatriptan  (maybe took once and it worked?). He denies any significant side effects. He continues to take B12 daily.  He thinks his apathy may have gotten a little better, but is at least similar and not worse than prior.   His sleep is good some nights and not good others. He still is not treating his OSA. He is aware of the dangers but not sure about treatment currently.  MEDICATIONS:  Outpatient Encounter Medications as of 05/23/2024  Medication Sig   amLODipine  (NORVASC ) 10 MG tablet TAKE 1 TABLET BY MOUTH EVERY DAY   atorvastatin  (LIPITOR) 20 MG tablet TAKE 1 TABLET BY MOUTH EVERY DAY   betamethasone dipropionate 0.05 % cream PRN   diclofenac (VOLTAREN) 75 MG EC tablet DAILY   diphenhydrAMINE  (BENADRYL ) 25 MG tablet Take 25 mg by mouth every 6 (six) hours as needed for itching.   hydrocortisone  2.5 % cream PLEASE SEE ATTACHED FOR DETAILED DIRECTIONS   metoprolol  succinate (TOPROL -XL) 50 MG 24 hr tablet TAKE 2 TABLETS BY MOUTH EVERY DAY   Multiple Vitamin (MULTI VITAMIN PO) Take by mouth.    pramoxine-hydrocortisone  (ANALPRAM  HC) cream Apply topically in the morning and at bedtime.   SUMAtriptan  (IMITREX ) 50 MG tablet TAKE 1 TABLET BY MOUTH AS NEEDED MIGRAINE   topiramate  (TOPAMAX ) 25 MG tablet TAKE 1 TABLET (25 MG TOTAL) BY MOUTH DAILY.   triamcinolone  cream (KENALOG ) 0.5 % Apply 1 application topically 2 (two) times daily. For 10 days to affected area (Patient taking differently: Apply 1 application  topically as needed. For 10 days to affected area)   No facility-administered encounter medications on file as of 05/23/2024.    PAST MEDICAL HISTORY: Past Medical History:  Diagnosis Date   Allergic rhinitis, cause unspecified    to tick bite - resolved   Arthritis    knees, hands   Balanoposthitis    Mild   Chest pain, unspecified    2007- stress test normal   Constipation 09/24/2009   Prn metamucil  - occasional   Dry eye syndrome    GERD (gastroesophageal reflux disease)    Hearing loss    slight - bilateral - no hearing aids   HEMORRHOID, EXTERNAL, THROMBOSED 05/06/2010   Qualifier: Diagnosis of  By: Joshua MD, Debby CROME.    History of shingles    Hypertension    Migraine, unspecified, without mention of intractable migraine without mention of status migrainosus    Severe sleep apnea 07/07/2022   Sleep apnea ??/????   TINNITUS NOS 01/31/2008   Qualifier: Diagnosis of  By: Plotnikov MD, Karlynn GAILS     PAST SURGICAL HISTORY: Past Surgical History:  Procedure Laterality Date   COLONOSCOPY  05/16/2011    hx polyps    FOOT FRACTURE SURGERY  05/16/1991   right-crushed all 4 toes reportedly   FRACTURE SURGERY  10/93   KNEE ARTHROSCOPY  05/15/2009   Right for Condromalacia   ORIF FEMUR FRACTURE  05/16/1991   Right   POLYPECTOMY     WISDOM TOOTH EXTRACTION      ALLERGIES: Allergies[1]  FAMILY HISTORY: Family History  Problem Relation Age of Onset   Diabetes type II Mother    Heart disease Mother        CABG-died during this, smoker   Hyperlipidemia  Mother    Hypertension Mother    Diabetes Father    Heart disease Father        thought  death due to this, smoker   Hypertension Father    Schizophrenia Father    Colon polyps Sister    Colon cancer Neg Hx    Stomach cancer Neg Hx    Esophageal cancer Neg Hx    Rectal cancer Neg Hx    Crohn's disease Neg Hx     SOCIAL HISTORY: Social History[2] Social History   Social History Narrative   Married-'81. 1 dtr - '87, 1 son - '83, 2 granddaughters   Marriage in good health.       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       Hobbies: time with grandkids, work with cows, minimal travel due to pain   Are you right handed or left handed? right   Are you currently employed ?    What is your current occupation?   Do you live at home alone?no   Who lives with you? wife   What type of home do you live in: 1 story or 2 story? two    Caffeine 1 cup daily      Objective:  Vital Signs:  BP 128/69   Pulse 77   Ht 5' 6 (1.676 m)   Wt 170 lb 3.2 oz (77.2 kg)   SpO2 96%   BMI 27.47 kg/m   General: No acute distress.  Patient appears well-groomed.   Head:  Normocephalic/atraumatic Eyes:  fundi examined, disc margins clear, no obvious papilledema Neck: supple Heart: regular rate and rhythm Lungs: Clear to auscultation bilaterally. Vascular: No carotid bruits.  Neurological Exam: Mental status: alert and oriented, speech fluent and not dysarthric, language intact.  Cranial nerves: CN I: not tested CN II: pupils equal, round and reactive to light, visual fields intact CN III, IV, VI:  full range of motion, no nystagmus, no ptosis CN V: facial sensation intact. CN VII: upper and lower face symmetric CN VIII: hearing intact CN IX, X: uvula midline CN XI: sternocleidomastoid and trapezius muscles intact CN XII:  tongue midline  Bulk & Tone: normal, no fasciculations. Motor:  muscle strength 5/5 throughout Deep Tendon Reflexes:  2+ throughout.   Sensation:  Light touch sensation intact. Finger to nose testing:  Without dysmetria.   Gait:  Normal station and stride   Labs and Imaging review: New results: 04/04/24: B12: 938 LDL 59.0 HbA1c: 5.5  11/09/23: Lyme panel negative Vit D wnl B1 wnl MMA wnl Folate wnl  Previously reviewed results: 09/28/23: B12: 295 HbA1c: 5.9 Lipid panel: tChol 139, LDL 43, TG 209   B12 (09/28/22): 241            Lab Results  Component Value Date    HGBA1C 5.7 09/25/2022               Lab Results  Component Value Date    TSH 1.63 09/15/2021      CBC (03/17/22) unremarkable CMP (03/17/22) unremarkable   CT head wo contrast (11/06/22): FINDINGS: Brain: No evidence of acute infarction, hemorrhage, hydrocephalus, extra-axial collection or mass lesion/mass effect.   Vascular: No hyperdense vessels.  No unexpected calcification   Skull: Normal. Negative for fracture or focal lesion.   Sinuses/Orbits: Small fluid level right maxillary sinus. Mucosal thickening in the sinuses   Other: None   IMPRESSION: 1. Negative non contrasted CT appearance of  the brain 2. Maxillary sinusitis  Assessment/Plan:  This is Jordan Cantrell, a 63 y.o. male with: Migraine with aura - currently very well controlled with Topamax  25 mg daily with maybe 1 headache since last visit, all which respond to sumatriptan . Poor sleep - ?OSA that is currently untreated B12 deficiency - now normalized Tick bites - Lyme was normal - could contribute to fatigue Apathy - unclear if this is related to Topamax  or related to poor sleep, vitamin deficiency, or depressive like symptom. I offered to switch topamax  to nortriptyline in the past, but headaches are well controlled and patient's apathy is stable to improved currently.  Plan: -Again discussed dangers of not treating OSA.  Patient to follow up with sleep medicine if he would like to treat -For migraines: Migraine prevention:  Continue topamax  25 mg at bedtime. Migraine rescue:  Continue sumatriptan  50 mg as needed at headache onset, can repeat after 2 hours if needed Limit use of pain relievers to no more than 2 days out of week to prevent risk of rebound or medication-overuse headache. Keep headache diary  Return to clinic in 1 year or sooner if needed  Venetia Potters, MD     [1]  Allergies Allergen Reactions   Codeine Nausea Only  [2]  Social History Tobacco Use   Smoking status: Former    Current packs/day: 0.00    Average packs/day: 0.1 packs/day    Types: Cigarettes    Quit date: 04/28/2014    Years since quitting: 10.0    Passive exposure: Past (PARENTS SMOKED)   Smokeless tobacco: Current    Types: Snuff   Tobacco comments:    Reported Dipping daily  Vaping Use   Vaping status: Never Used  Substance Use Topics   Alcohol use: Yes    Alcohol/week: 25.0 - 27.0 standard drinks of alcohol    Types: 10 - 12 Cans of beer, 15 Shots of liquor per week    Comment: beer/liquor   Drug use: Never   "

## 2024-05-23 ENCOUNTER — Ambulatory Visit: Admitting: Neurology

## 2024-05-23 ENCOUNTER — Encounter: Payer: Self-pay | Admitting: Neurology

## 2024-05-23 VITALS — BP 128/69 | HR 77 | Ht 66.0 in | Wt 170.2 lb

## 2024-05-23 DIAGNOSIS — E538 Deficiency of other specified B group vitamins: Secondary | ICD-10-CM

## 2024-05-23 DIAGNOSIS — G43E09 Chronic migraine with aura, not intractable, without status migrainosus: Secondary | ICD-10-CM

## 2024-05-23 DIAGNOSIS — G4733 Obstructive sleep apnea (adult) (pediatric): Secondary | ICD-10-CM | POA: Diagnosis not present

## 2024-05-23 DIAGNOSIS — E559 Vitamin D deficiency, unspecified: Secondary | ICD-10-CM | POA: Diagnosis not present

## 2024-05-23 DIAGNOSIS — W57XXXA Bitten or stung by nonvenomous insect and other nonvenomous arthropods, initial encounter: Secondary | ICD-10-CM

## 2024-05-23 MED ORDER — TOPIRAMATE 25 MG PO TABS: 25.0000 mg | ORAL_TABLET | Freq: Every day | ORAL | 3 refills | Status: AC

## 2024-05-23 NOTE — Patient Instructions (Signed)
-  For migraines: Migraine prevention:  Continue topamax  25 mg at bedtime. Migraine rescue:  Continue sumatriptan  50 mg as needed at headache onset, can repeat after 2 hours if needed Limit use of pain relievers to no more than 2 days out of week to prevent risk of rebound or medication-overuse headache. Keep headache diary  Return to clinic in 1 year or sooner if needed  The physicians and staff at [facility] are committed to providing excellent care. You may receive a survey requesting feedback about your experience at our office. We strive to receive very good responses to the survey questions. If you feel that your experience would prevent you from giving the office a very good  response, please contact our office to try to remedy the situation. We may be reached at [phone]. Thank you for taking the time out of your busy day to complete the survey.  Venetia Potters, MD [facility]

## 2024-10-03 ENCOUNTER — Encounter: Admitting: Family Medicine

## 2025-05-29 ENCOUNTER — Ambulatory Visit: Payer: Self-pay | Admitting: Neurology
